# Patient Record
Sex: Female | Born: 1950 | Race: White | Hispanic: No | Marital: Married
Health system: Midwestern US, Academic
[De-identification: ages and names within clinical notes are randomized; demographics above are authoritative.]

---

## 2017-07-04 ENCOUNTER — Encounter: Admit: 2017-07-04 | Discharge: 2017-07-05 | Payer: MEDICARE

## 2017-07-05 ENCOUNTER — Encounter: Admit: 2017-07-05 | Discharge: 2017-07-06 | Payer: Commercial Managed Care - PPO

## 2017-07-14 ENCOUNTER — Encounter: Admit: 2017-07-14 | Discharge: 2017-07-14 | Payer: MEDICARE

## 2017-07-15 ENCOUNTER — Encounter: Admit: 2017-07-15 | Discharge: 2017-07-15 | Payer: MEDICARE

## 2017-07-15 ENCOUNTER — Encounter: Admit: 2017-07-15 | Discharge: 2017-07-15 | Payer: Commercial Managed Care - PPO

## 2017-07-15 DIAGNOSIS — N6310 Unspecified lump in the right breast, unspecified quadrant: ICD-10-CM

## 2017-07-15 DIAGNOSIS — R928 Other abnormal and inconclusive findings on diagnostic imaging of breast: Principal | ICD-10-CM

## 2017-07-17 ENCOUNTER — Encounter: Admit: 2017-07-17 | Discharge: 2017-07-17 | Payer: MEDICARE

## 2017-07-18 ENCOUNTER — Ambulatory Visit: Admit: 2017-07-18 | Discharge: 2017-07-19 | Payer: Commercial Managed Care - PPO

## 2017-07-18 ENCOUNTER — Ambulatory Visit: Admit: 2017-07-18 | Discharge: 2017-07-19 | Payer: MEDICARE

## 2017-07-18 ENCOUNTER — Encounter: Admit: 2017-07-18 | Discharge: 2017-07-18 | Payer: MEDICARE

## 2017-07-18 ENCOUNTER — Ambulatory Visit: Admit: 2017-07-18 | Discharge: 2017-07-18

## 2017-07-18 ENCOUNTER — Encounter: Admit: 2017-07-18 | Discharge: 2017-07-18 | Payer: Commercial Managed Care - PPO

## 2017-07-18 DIAGNOSIS — N6310 Unspecified lump in the right breast, unspecified quadrant: ICD-10-CM

## 2017-07-18 DIAGNOSIS — M549 Dorsalgia, unspecified: ICD-10-CM

## 2017-07-18 DIAGNOSIS — M109 Gout, unspecified: ICD-10-CM

## 2017-07-18 DIAGNOSIS — M1990 Unspecified osteoarthritis, unspecified site: Principal | ICD-10-CM

## 2017-07-18 DIAGNOSIS — R928 Other abnormal and inconclusive findings on diagnostic imaging of breast: Principal | ICD-10-CM

## 2017-07-18 DIAGNOSIS — M797 Fibromyalgia: ICD-10-CM

## 2017-07-18 DIAGNOSIS — H9190 Unspecified hearing loss, unspecified ear: ICD-10-CM

## 2017-07-18 MED ORDER — LIDOCAINE 1% (BUFFERED) SYRINGE (BREAST CENTER)
1 mL | Freq: Once | INTRAMUSCULAR | 0 refills | Status: CP
Start: 2017-07-18 — End: ?

## 2017-07-18 MED ORDER — LIDOCAINE 1%-EPINEPHRINE 1:100000 (BUFFERED) VIAL (BREAST CENTER)
2 mL | Freq: Once | INTRAMUSCULAR | 0 refills | Status: CP
Start: 2017-07-18 — End: ?

## 2017-07-23 ENCOUNTER — Encounter: Admit: 2017-07-23 | Discharge: 2017-07-23 | Payer: Commercial Managed Care - PPO

## 2017-07-23 DIAGNOSIS — C50411 Malignant neoplasm of upper-outer quadrant of right female breast: Principal | ICD-10-CM

## 2017-07-24 ENCOUNTER — Encounter: Admit: 2017-07-24 | Discharge: 2017-07-24 | Payer: MEDICARE

## 2017-07-24 DIAGNOSIS — H9190 Unspecified hearing loss, unspecified ear: ICD-10-CM

## 2017-07-24 DIAGNOSIS — M549 Dorsalgia, unspecified: ICD-10-CM

## 2017-07-24 DIAGNOSIS — M797 Fibromyalgia: ICD-10-CM

## 2017-07-24 DIAGNOSIS — M109 Gout, unspecified: ICD-10-CM

## 2017-07-24 DIAGNOSIS — M1990 Unspecified osteoarthritis, unspecified site: Principal | ICD-10-CM

## 2017-07-25 ENCOUNTER — Encounter: Admit: 2017-07-25 | Discharge: 2017-07-25 | Payer: MEDICARE

## 2017-07-25 ENCOUNTER — Encounter: Admit: 2017-07-25 | Discharge: 2017-07-25 | Payer: Commercial Managed Care - PPO

## 2017-07-25 DIAGNOSIS — M109 Gout, unspecified: ICD-10-CM

## 2017-07-25 DIAGNOSIS — M1990 Unspecified osteoarthritis, unspecified site: Principal | ICD-10-CM

## 2017-07-25 DIAGNOSIS — H9190 Unspecified hearing loss, unspecified ear: ICD-10-CM

## 2017-07-25 DIAGNOSIS — M797 Fibromyalgia: ICD-10-CM

## 2017-07-25 DIAGNOSIS — Z170 Estrogen receptor positive status [ER+]: ICD-10-CM

## 2017-07-25 DIAGNOSIS — M549 Dorsalgia, unspecified: ICD-10-CM

## 2017-07-25 DIAGNOSIS — C50411 Malignant neoplasm of upper-outer quadrant of right female breast: Principal | ICD-10-CM

## 2017-07-30 ENCOUNTER — Ambulatory Visit: Admit: 2017-07-30 | Discharge: 2017-07-30 | Payer: Commercial Managed Care - PPO

## 2017-07-30 DIAGNOSIS — Z170 Estrogen receptor positive status [ER+]: ICD-10-CM

## 2017-07-30 DIAGNOSIS — C50411 Malignant neoplasm of upper-outer quadrant of right female breast: Principal | ICD-10-CM

## 2017-07-30 MED ORDER — SODIUM CHLORIDE 0.9 % IJ SOLN
50 mL | Freq: Once | INTRAVENOUS | 0 refills | Status: CP
Start: 2017-07-30 — End: ?
  Administered 2017-07-30: 19:00:00 50 mL via INTRAVENOUS

## 2017-07-30 MED ORDER — GADOBENATE DIMEGLUMINE 529 MG/ML (0.1MMOL/0.2ML) IV SOLN
15 mL | Freq: Once | INTRAVENOUS | 0 refills | Status: CP
Start: 2017-07-30 — End: ?
  Administered 2017-07-30: 19:00:00 15 mL via INTRAVENOUS

## 2017-07-31 ENCOUNTER — Encounter: Admit: 2017-07-31 | Discharge: 2017-07-31 | Payer: MEDICARE

## 2017-07-31 ENCOUNTER — Encounter: Admit: 2017-07-31 | Discharge: 2017-07-31 | Payer: Commercial Managed Care - PPO

## 2017-07-31 DIAGNOSIS — C50411 Malignant neoplasm of upper-outer quadrant of right female breast: ICD-10-CM

## 2017-07-31 DIAGNOSIS — I890 Lymphedema, not elsewhere classified: ICD-10-CM

## 2017-07-31 DIAGNOSIS — H9190 Unspecified hearing loss, unspecified ear: ICD-10-CM

## 2017-07-31 DIAGNOSIS — Z9189 Other specified personal risk factors, not elsewhere classified: ICD-10-CM

## 2017-07-31 DIAGNOSIS — Z01818 Encounter for other preprocedural examination: Principal | ICD-10-CM

## 2017-07-31 DIAGNOSIS — M549 Dorsalgia, unspecified: ICD-10-CM

## 2017-07-31 DIAGNOSIS — Z170 Estrogen receptor positive status [ER+]: ICD-10-CM

## 2017-07-31 DIAGNOSIS — M1990 Unspecified osteoarthritis, unspecified site: Principal | ICD-10-CM

## 2017-07-31 DIAGNOSIS — M797 Fibromyalgia: ICD-10-CM

## 2017-07-31 DIAGNOSIS — M109 Gout, unspecified: ICD-10-CM

## 2017-07-31 LAB — COMPREHENSIVE METABOLIC PANEL
Lab: 0.7 mg/dL (ref 0.4–1.00)
Lab: 105 MMOL/L (ref 98–110)
Lab: 141 MMOL/L (ref 137–147)
Lab: 20 mg/dL (ref 7–25)
Lab: 3.7 MMOL/L (ref 3.5–5.1)
Lab: 9.5 mg/dL — ABNORMAL LOW (ref 8.5–10.6)
Lab: 91 mg/dL (ref 70–100)

## 2017-07-31 LAB — CBC AND DIFF
Lab: 4.3 M/UL (ref 4.0–5.0)
Lab: 7.5 10*3/uL (ref 4.5–11.0)

## 2017-08-02 ENCOUNTER — Encounter: Admit: 2017-08-02 | Discharge: 2017-08-02 | Payer: MEDICARE

## 2017-08-02 DIAGNOSIS — C50411 Malignant neoplasm of upper-outer quadrant of right female breast: Principal | ICD-10-CM

## 2017-08-02 MED ORDER — CEFAZOLIN INJ 1GM IVP
2 g | Freq: Once | INTRAVENOUS | 0 refills | Status: CN
Start: 2017-08-02 — End: ?

## 2017-08-06 ENCOUNTER — Encounter: Admit: 2017-08-06 | Discharge: 2017-08-06 | Payer: Commercial Managed Care - PPO

## 2017-08-06 DIAGNOSIS — C50411 Malignant neoplasm of upper-outer quadrant of right female breast: Principal | ICD-10-CM

## 2017-08-08 ENCOUNTER — Encounter: Admit: 2017-08-08 | Discharge: 2017-08-08 | Payer: Commercial Managed Care - PPO

## 2017-08-08 DIAGNOSIS — H9190 Unspecified hearing loss, unspecified ear: ICD-10-CM

## 2017-08-08 DIAGNOSIS — M1990 Unspecified osteoarthritis, unspecified site: Principal | ICD-10-CM

## 2017-08-08 DIAGNOSIS — J45909 Unspecified asthma, uncomplicated: ICD-10-CM

## 2017-08-08 DIAGNOSIS — G4730 Sleep apnea, unspecified: ICD-10-CM

## 2017-08-08 DIAGNOSIS — M109 Gout, unspecified: ICD-10-CM

## 2017-08-08 DIAGNOSIS — M549 Dorsalgia, unspecified: ICD-10-CM

## 2017-08-08 DIAGNOSIS — K929 Disease of digestive system, unspecified: ICD-10-CM

## 2017-08-08 DIAGNOSIS — M797 Fibromyalgia: ICD-10-CM

## 2017-08-08 DIAGNOSIS — Z87898 Personal history of other specified conditions: ICD-10-CM

## 2017-08-09 ENCOUNTER — Encounter: Admit: 2017-08-09 | Discharge: 2017-08-09 | Payer: MEDICARE

## 2017-08-09 ENCOUNTER — Encounter: Admit: 2017-08-09 | Discharge: 2017-08-09 | Payer: Commercial Managed Care - PPO

## 2017-08-09 ENCOUNTER — Ambulatory Visit: Admit: 2017-08-09 | Discharge: 2017-08-10 | Payer: Commercial Managed Care - PPO

## 2017-08-09 DIAGNOSIS — J45909 Unspecified asthma, uncomplicated: ICD-10-CM

## 2017-08-09 DIAGNOSIS — K929 Disease of digestive system, unspecified: ICD-10-CM

## 2017-08-09 DIAGNOSIS — M109 Gout, unspecified: ICD-10-CM

## 2017-08-09 DIAGNOSIS — G4730 Sleep apnea, unspecified: ICD-10-CM

## 2017-08-09 DIAGNOSIS — M1990 Unspecified osteoarthritis, unspecified site: Principal | ICD-10-CM

## 2017-08-09 DIAGNOSIS — Z87898 Personal history of other specified conditions: ICD-10-CM

## 2017-08-09 DIAGNOSIS — M549 Dorsalgia, unspecified: ICD-10-CM

## 2017-08-09 DIAGNOSIS — H9190 Unspecified hearing loss, unspecified ear: ICD-10-CM

## 2017-08-09 DIAGNOSIS — M797 Fibromyalgia: ICD-10-CM

## 2017-08-10 DIAGNOSIS — Z170 Estrogen receptor positive status [ER+]: ICD-10-CM

## 2017-08-10 DIAGNOSIS — C50411 Malignant neoplasm of upper-outer quadrant of right female breast: Principal | ICD-10-CM

## 2017-08-12 ENCOUNTER — Encounter: Admit: 2017-08-12 | Discharge: 2017-08-12 | Payer: Commercial Managed Care - PPO

## 2017-08-14 ENCOUNTER — Encounter: Admit: 2017-08-14 | Discharge: 2017-08-14 | Payer: MEDICARE

## 2017-08-15 ENCOUNTER — Encounter: Admit: 2017-08-15 | Discharge: 2017-08-16 | Payer: Commercial Managed Care - PPO

## 2017-08-15 ENCOUNTER — Encounter: Admit: 2017-08-15 | Discharge: 2017-08-15 | Payer: Commercial Managed Care - PPO

## 2017-08-15 ENCOUNTER — Encounter: Admit: 2017-08-15 | Discharge: 2017-08-15 | Payer: MEDICARE

## 2017-08-15 ENCOUNTER — Ambulatory Visit: Admit: 2017-08-15 | Discharge: 2017-08-16 | Payer: MEDICARE

## 2017-08-15 DIAGNOSIS — C50411 Malignant neoplasm of upper-outer quadrant of right female breast: Principal | ICD-10-CM

## 2017-08-15 DIAGNOSIS — M1990 Unspecified osteoarthritis, unspecified site: Principal | ICD-10-CM

## 2017-08-15 DIAGNOSIS — H9190 Unspecified hearing loss, unspecified ear: ICD-10-CM

## 2017-08-15 DIAGNOSIS — J45909 Unspecified asthma, uncomplicated: ICD-10-CM

## 2017-08-15 DIAGNOSIS — M109 Gout, unspecified: ICD-10-CM

## 2017-08-15 DIAGNOSIS — G4730 Sleep apnea, unspecified: ICD-10-CM

## 2017-08-15 DIAGNOSIS — M797 Fibromyalgia: ICD-10-CM

## 2017-08-15 DIAGNOSIS — M549 Dorsalgia, unspecified: ICD-10-CM

## 2017-08-15 DIAGNOSIS — Z87898 Personal history of other specified conditions: ICD-10-CM

## 2017-08-15 DIAGNOSIS — K929 Disease of digestive system, unspecified: ICD-10-CM

## 2017-08-15 MED ORDER — CEFAZOLIN INJ 1GM IVP
2 g | Freq: Once | INTRAVENOUS | 0 refills | Status: CP
Start: 2017-08-15 — End: ?
  Administered 2017-08-15: 13:00:00 2 g via INTRAVENOUS

## 2017-08-15 MED ORDER — LIDOCAINE (PF) 10 MG/ML (1 %) IJ SOLN
.1-2 mL | Freq: Once | INTRAMUSCULAR | 0 refills | Status: CP
Start: 2017-08-15 — End: ?

## 2017-08-15 MED ORDER — ALLOPURINOL 100 MG PO TAB
100 mg | Freq: Every day | ORAL | 0 refills | Status: DC
Start: 2017-08-15 — End: 2017-08-16
  Administered 2017-08-16: 13:00:00 100 mg via ORAL

## 2017-08-15 MED ORDER — GABAPENTIN 300 MG PO CAP
300 mg | Freq: Once | ORAL | 0 refills | Status: CP
Start: 2017-08-15 — End: ?
  Administered 2017-08-15: 13:00:00 300 mg via ORAL

## 2017-08-15 MED ORDER — HYDROMORPHONE (PF) 2 MG/ML IJ SYRG
.5-1 mg | INTRAVENOUS | 0 refills | Status: DC | PRN
Start: 2017-08-15 — End: 2017-08-15

## 2017-08-15 MED ORDER — PROMETHAZINE 25 MG/ML IJ SOLN
12.5 mg | INTRAVENOUS | 0 refills | Status: DC | PRN
Start: 2017-08-15 — End: 2017-08-16

## 2017-08-15 MED ORDER — GABAPENTIN 300 MG PO CAP
300 mg | Freq: Every day | ORAL | 0 refills | Status: DC
Start: 2017-08-15 — End: 2017-08-16
  Administered 2017-08-16: 13:00:00 300 mg via ORAL

## 2017-08-15 MED ORDER — ONDANSETRON HCL (PF) 4 MG/2 ML IJ SOLN
4-8 mg | INTRAVENOUS | 0 refills | Status: DC | PRN
Start: 2017-08-15 — End: 2017-08-16

## 2017-08-15 MED ORDER — CEFAZOLIN INJ 1GM IVP
1 g | INTRAVENOUS | 0 refills | Status: CP
Start: 2017-08-15 — End: ?
  Administered 2017-08-15 – 2017-08-16 (×2): 1 g via INTRAVENOUS

## 2017-08-15 MED ORDER — HALOPERIDOL LACTATE 5 MG/ML IJ SOLN
1 mg | Freq: Once | INTRAVENOUS | 0 refills | Status: DC | PRN
Start: 2017-08-15 — End: 2017-08-15

## 2017-08-15 MED ORDER — FENTANYL CITRATE (PF) 50 MCG/ML IJ SOLN
0 refills | Status: DC
Start: 2017-08-15 — End: 2017-08-15
  Administered 2017-08-15 (×2): 50 ug via INTRAVENOUS

## 2017-08-15 MED ORDER — MIDAZOLAM 1 MG/ML IJ SOLN
INTRAVENOUS | 0 refills | Status: DC
Start: 2017-08-15 — End: 2017-08-15
  Administered 2017-08-15: 13:00:00 2 mg via INTRAVENOUS

## 2017-08-15 MED ORDER — DIAZEPAM 5 MG PO TAB
5 mg | ORAL | 0 refills | Status: DC | PRN
Start: 2017-08-15 — End: 2017-08-16

## 2017-08-15 MED ORDER — FENTANYL CITRATE (PF) 50 MCG/ML IJ SOLN
25 ug | INTRAVENOUS | 0 refills | Status: DC | PRN
Start: 2017-08-15 — End: 2017-08-15

## 2017-08-15 MED ORDER — SIMVASTATIN 20 MG PO TAB
20 mg | Freq: Every evening | ORAL | 0 refills | Status: DC
Start: 2017-08-15 — End: 2017-08-16
  Administered 2017-08-16: 01:00:00 20 mg via ORAL

## 2017-08-15 MED ORDER — RP DX TC-99M SULF COLL MCI
.5 | Freq: Once | INTRAVENOUS | 0 refills | Status: CP
Start: 2017-08-15 — End: ?
  Administered 2017-08-15: 13:00:00 0.5 via INTRAVENOUS

## 2017-08-15 MED ORDER — OXYCODONE 5 MG PO TAB
5-10 mg | Freq: Once | ORAL | 0 refills | Status: DC | PRN
Start: 2017-08-15 — End: 2017-08-15

## 2017-08-15 MED ORDER — DEXAMETHASONE SODIUM PHOSPHATE 4 MG/ML IJ SOLN
INTRAVENOUS | 0 refills | Status: DC
Start: 2017-08-15 — End: 2017-08-15
  Administered 2017-08-15: 13:00:00 10 mg via INTRAVENOUS

## 2017-08-15 MED ORDER — ISOSULFAN BLUE 1 % SC SOLN
0 refills | Status: DC
Start: 2017-08-15 — End: 2017-08-15
  Administered 2017-08-15: 13:00:00 2.5 mL via INTRAMUSCULAR

## 2017-08-15 MED ORDER — OXYCODONE 5 MG PO TAB
5-10 mg | ORAL | 0 refills | Status: DC | PRN
Start: 2017-08-15 — End: 2017-08-16

## 2017-08-15 MED ORDER — METOCLOPRAMIDE HCL 5 MG/ML IJ SOLN
10 mg | Freq: Once | INTRAVENOUS | 0 refills | Status: DC | PRN
Start: 2017-08-15 — End: 2017-08-15

## 2017-08-15 MED ORDER — DEXTROSE 5%-0.45% SODIUM CHLORIDE & POTASSIUM CHLORIDE 20 MEQ/L IV SOLP
INTRAVENOUS | 0 refills | Status: DC
Start: 2017-08-15 — End: 2017-08-16
  Administered 2017-08-15: 21:00:00 1000.000 mL via INTRAVENOUS

## 2017-08-15 MED ORDER — MORPHINE 4 MG/ML IV CRTG
2-4 mg | INTRAVENOUS | 0 refills | Status: DC | PRN
Start: 2017-08-15 — End: 2017-08-16

## 2017-08-15 MED ORDER — ACETAMINOPHEN 500 MG PO TAB
1000 mg | Freq: Once | ORAL | 0 refills | Status: CP
Start: 2017-08-15 — End: ?
  Administered 2017-08-15: 13:00:00 1000 mg via ORAL

## 2017-08-15 MED ORDER — PROPOFOL INJ 10 MG/ML IV VIAL
0 refills | Status: DC
Start: 2017-08-15 — End: 2017-08-15
  Administered 2017-08-15: 13:00:00 200 mg via INTRAVENOUS

## 2017-08-15 MED ORDER — PANTOPRAZOLE 40 MG PO TBEC
40 mg | Freq: Every day | ORAL | 0 refills | Status: DC
Start: 2017-08-15 — End: 2017-08-16
  Administered 2017-08-16: 13:00:00 40 mg via ORAL

## 2017-08-15 MED ORDER — LIDOCAINE (PF) 200 MG/10 ML (2 %) IJ SYRG
0 refills | Status: DC
Start: 2017-08-15 — End: 2017-08-15
  Administered 2017-08-15: 13:00:00 50 mg via INTRAVENOUS

## 2017-08-15 MED ORDER — LACTATED RINGERS IV SOLP
INTRAVENOUS | 0 refills | Status: DC
Start: 2017-08-15 — End: 2017-08-16
  Administered 2017-08-15: 12:00:00 1000 mL via INTRAVENOUS

## 2017-08-15 MED ORDER — DIPHENHYDRAMINE HCL 50 MG/ML IJ SOLN
25 mg | Freq: Once | INTRAVENOUS | 0 refills | Status: DC | PRN
Start: 2017-08-15 — End: 2017-08-15

## 2017-08-15 MED ORDER — ACETAMINOPHEN 500 MG PO TAB
1000 mg | ORAL | 0 refills | Status: DC
Start: 2017-08-15 — End: 2017-08-16
  Administered 2017-08-15 – 2017-08-16 (×3): 1000 mg via ORAL

## 2017-08-15 MED ORDER — ISOSULFAN BLUE 1 % SC SOLN
25 mg | Freq: Once | INTRAMUSCULAR | 0 refills | Status: AC
Start: 2017-08-15 — End: ?

## 2017-08-15 MED ORDER — BACITRACIN 50,000 UN NS 1000 ML IRR BOT (OR)
0 refills | Status: DC
Start: 2017-08-15 — End: 2017-08-15
  Administered 2017-08-15 (×2): 1000 mL

## 2017-08-15 MED ORDER — SENNOSIDES-DOCUSATE SODIUM 8.6-50 MG PO TAB
1 | Freq: Two times a day (BID) | ORAL | 0 refills | Status: DC
Start: 2017-08-15 — End: 2017-08-16
  Administered 2017-08-16 (×2): 1 via ORAL

## 2017-08-15 MED ORDER — SUCCINYLCHOLINE CHLORIDE 20 MG/ML IJ SOLN
INTRAVENOUS | 0 refills | Status: DC
Start: 2017-08-15 — End: 2017-08-15
  Administered 2017-08-15: 13:00:00 100 mg via INTRAVENOUS

## 2017-08-15 MED ORDER — PNEUMOCOCCAL 23-VAL PS VACCINE 25 MCG/0.5 ML IJ SOLN
0.5 mL | Freq: Once | INTRAMUSCULAR | 0 refills | Status: CP
Start: 2017-08-15 — End: ?
  Administered 2017-08-16: 13:00:00 0.5 mL via INTRAMUSCULAR

## 2017-08-15 MED ORDER — FAMOTIDINE (PF) 20 MG/2 ML IV SOLN
0 refills | Status: DC
Start: 2017-08-15 — End: 2017-08-15
  Administered 2017-08-15: 13:00:00 20 mg via INTRAVENOUS

## 2017-08-15 MED ORDER — OXYCODONE SR 10 MG PO 12HR TABLET
10 mg | Freq: Once | ORAL | 0 refills | Status: CP
Start: 2017-08-15 — End: ?
  Administered 2017-08-15: 13:00:00 10 mg via ORAL

## 2017-08-15 MED ORDER — ONDANSETRON HCL (PF) 4 MG/2 ML IJ SOLN
INTRAVENOUS | 0 refills | Status: DC
Start: 2017-08-15 — End: 2017-08-15
  Administered 2017-08-15: 15:00:00 4 mg via INTRAVENOUS

## 2017-08-15 MED ORDER — DIPHENHYDRAMINE HCL 50 MG/ML IJ SOLN
0 refills | Status: DC
Start: 2017-08-15 — End: 2017-08-15
  Administered 2017-08-15: 13:00:00 25 mg via INTRAVENOUS

## 2017-08-15 MED ADMIN — SODIUM CHLORIDE 0.9 % IJ SOLN [7319]: 20 mL | INTRAVENOUS | @ 21:00:00 | Stop: 2017-08-15 | NDC 00409488803

## 2017-08-15 MED ADMIN — LIDOCAINE (PF) 10 MG/ML (1 %) IJ SOLN [95838]: 0.1 mL | INTRAMUSCULAR | @ 12:00:00 | Stop: 2017-08-15 | NDC 63323049227

## 2017-08-16 ENCOUNTER — Encounter: Admit: 2017-08-16 | Discharge: 2017-08-16 | Payer: MEDICARE

## 2017-08-16 DIAGNOSIS — Z170 Estrogen receptor positive status [ER+]: ICD-10-CM

## 2017-08-16 DIAGNOSIS — Z23 Encounter for immunization: ICD-10-CM

## 2017-08-16 DIAGNOSIS — M797 Fibromyalgia: ICD-10-CM

## 2017-08-16 DIAGNOSIS — C50411 Malignant neoplasm of upper-outer quadrant of right female breast: Principal | ICD-10-CM

## 2017-08-16 DIAGNOSIS — Z9884 Bariatric surgery status: ICD-10-CM

## 2017-08-16 DIAGNOSIS — L821 Other seborrheic keratosis: ICD-10-CM

## 2017-08-16 MED ORDER — ACETAMINOPHEN 325 MG PO TAB
650 mg | ORAL_TABLET | ORAL | 0 refills | Status: AC
Start: 2017-08-16 — End: 2019-04-23

## 2017-08-16 MED ORDER — DOXYCYCLINE HYCLATE 100 MG PO CAP
100 mg | ORAL_CAPSULE | Freq: Two times a day (BID) | ORAL | 0 refills | 8.00000 days | Status: AC
Start: 2017-08-16 — End: 2017-09-20
  Filled 2017-08-16 (×2): qty 42, 21d supply, fill #1

## 2017-08-16 MED ORDER — SENNOSIDES-DOCUSATE SODIUM 8.6-50 MG PO TAB
1 | ORAL_TABLET | Freq: Every day | ORAL | 1 refills | Status: AC
Start: 2017-08-16 — End: ?

## 2017-08-16 MED ORDER — OXYCODONE 5 MG PO TAB
5-10 mg | ORAL_TABLET | ORAL | 0 refills | 6.00000 days | Status: AC | PRN
Start: 2017-08-16 — End: 2017-09-26
  Filled 2017-08-16 (×2): qty 16, 2d supply, fill #1

## 2017-08-16 MED ORDER — DIAZEPAM 5 MG PO TAB
5 mg | ORAL_TABLET | ORAL | 0 refills | Status: SS | PRN
Start: 2017-08-16 — End: 2017-10-10

## 2017-08-16 MED ADMIN — SODIUM CHLORIDE 0.9 % IJ SOLN [7319]: 10 mL | INTRAVENOUS | @ 05:00:00 | Stop: 2017-08-16 | NDC 00409488803

## 2017-08-16 MED FILL — DIAZEPAM 5 MG PO TAB: 5 mg | ORAL | 8 days supply | Qty: 32 | Fill #1 | Status: CP

## 2017-08-19 ENCOUNTER — Encounter: Admit: 2017-08-19 | Discharge: 2017-08-19 | Payer: MEDICARE

## 2017-08-19 DIAGNOSIS — M109 Gout, unspecified: ICD-10-CM

## 2017-08-19 DIAGNOSIS — M549 Dorsalgia, unspecified: ICD-10-CM

## 2017-08-19 DIAGNOSIS — Z87898 Personal history of other specified conditions: ICD-10-CM

## 2017-08-19 DIAGNOSIS — G4730 Sleep apnea, unspecified: ICD-10-CM

## 2017-08-19 DIAGNOSIS — J45909 Unspecified asthma, uncomplicated: ICD-10-CM

## 2017-08-19 DIAGNOSIS — H9190 Unspecified hearing loss, unspecified ear: ICD-10-CM

## 2017-08-19 DIAGNOSIS — M797 Fibromyalgia: ICD-10-CM

## 2017-08-19 DIAGNOSIS — K929 Disease of digestive system, unspecified: ICD-10-CM

## 2017-08-19 DIAGNOSIS — M1990 Unspecified osteoarthritis, unspecified site: Principal | ICD-10-CM

## 2017-08-23 ENCOUNTER — Ambulatory Visit: Admit: 2017-08-23 | Discharge: 2017-08-24 | Payer: Commercial Managed Care - PPO

## 2017-08-23 ENCOUNTER — Encounter: Admit: 2017-08-23 | Discharge: 2017-08-23 | Payer: Commercial Managed Care - PPO

## 2017-08-23 ENCOUNTER — Encounter: Admit: 2017-08-23 | Discharge: 2017-08-23 | Payer: MEDICARE

## 2017-08-23 DIAGNOSIS — J45909 Unspecified asthma, uncomplicated: ICD-10-CM

## 2017-08-23 DIAGNOSIS — M109 Gout, unspecified: ICD-10-CM

## 2017-08-23 DIAGNOSIS — H9190 Unspecified hearing loss, unspecified ear: ICD-10-CM

## 2017-08-23 DIAGNOSIS — M549 Dorsalgia, unspecified: ICD-10-CM

## 2017-08-23 DIAGNOSIS — G4730 Sleep apnea, unspecified: ICD-10-CM

## 2017-08-23 DIAGNOSIS — M1990 Unspecified osteoarthritis, unspecified site: Principal | ICD-10-CM

## 2017-08-23 DIAGNOSIS — M797 Fibromyalgia: ICD-10-CM

## 2017-08-23 DIAGNOSIS — Z87898 Personal history of other specified conditions: ICD-10-CM

## 2017-08-23 DIAGNOSIS — K929 Disease of digestive system, unspecified: ICD-10-CM

## 2017-08-24 DIAGNOSIS — Z170 Estrogen receptor positive status [ER+]: ICD-10-CM

## 2017-08-24 DIAGNOSIS — C50411 Malignant neoplasm of upper-outer quadrant of right female breast: Principal | ICD-10-CM

## 2017-08-26 ENCOUNTER — Encounter: Admit: 2017-08-26 | Discharge: 2017-08-26 | Payer: MEDICARE

## 2017-08-27 ENCOUNTER — Encounter: Admit: 2017-08-27 | Discharge: 2017-08-27 | Payer: Commercial Managed Care - PPO

## 2017-08-27 ENCOUNTER — Encounter: Admit: 2017-08-27 | Discharge: 2017-08-27 | Payer: MEDICARE

## 2017-08-27 DIAGNOSIS — K929 Disease of digestive system, unspecified: ICD-10-CM

## 2017-08-27 DIAGNOSIS — Z170 Estrogen receptor positive status [ER+]: ICD-10-CM

## 2017-08-27 DIAGNOSIS — H9190 Unspecified hearing loss, unspecified ear: ICD-10-CM

## 2017-08-27 DIAGNOSIS — M109 Gout, unspecified: ICD-10-CM

## 2017-08-27 DIAGNOSIS — Z87898 Personal history of other specified conditions: ICD-10-CM

## 2017-08-27 DIAGNOSIS — G4730 Sleep apnea, unspecified: ICD-10-CM

## 2017-08-27 DIAGNOSIS — M797 Fibromyalgia: ICD-10-CM

## 2017-08-27 DIAGNOSIS — M549 Dorsalgia, unspecified: ICD-10-CM

## 2017-08-27 DIAGNOSIS — C50411 Malignant neoplasm of upper-outer quadrant of right female breast: Principal | ICD-10-CM

## 2017-08-27 DIAGNOSIS — M1990 Unspecified osteoarthritis, unspecified site: Principal | ICD-10-CM

## 2017-08-27 DIAGNOSIS — J45909 Unspecified asthma, uncomplicated: ICD-10-CM

## 2017-08-30 ENCOUNTER — Encounter: Admit: 2017-08-30 | Discharge: 2017-08-30 | Payer: MEDICARE

## 2017-08-30 ENCOUNTER — Ambulatory Visit: Admit: 2017-08-30 | Discharge: 2017-08-31 | Payer: Commercial Managed Care - PPO

## 2017-08-30 DIAGNOSIS — C50411 Malignant neoplasm of upper-outer quadrant of right female breast: Principal | ICD-10-CM

## 2017-08-30 DIAGNOSIS — G4730 Sleep apnea, unspecified: ICD-10-CM

## 2017-08-30 DIAGNOSIS — M1990 Unspecified osteoarthritis, unspecified site: Principal | ICD-10-CM

## 2017-08-30 DIAGNOSIS — M549 Dorsalgia, unspecified: ICD-10-CM

## 2017-08-30 DIAGNOSIS — M109 Gout, unspecified: ICD-10-CM

## 2017-08-30 DIAGNOSIS — H9190 Unspecified hearing loss, unspecified ear: ICD-10-CM

## 2017-08-30 DIAGNOSIS — Z87898 Personal history of other specified conditions: ICD-10-CM

## 2017-08-30 DIAGNOSIS — J45909 Unspecified asthma, uncomplicated: ICD-10-CM

## 2017-08-30 DIAGNOSIS — K929 Disease of digestive system, unspecified: ICD-10-CM

## 2017-08-30 DIAGNOSIS — M797 Fibromyalgia: ICD-10-CM

## 2017-09-02 ENCOUNTER — Encounter: Admit: 2017-09-02 | Discharge: 2017-09-02 | Payer: Commercial Managed Care - PPO

## 2017-09-04 ENCOUNTER — Encounter: Admit: 2017-09-04 | Discharge: 2017-09-04 | Payer: MEDICARE

## 2017-09-04 DIAGNOSIS — C50411 Malignant neoplasm of upper-outer quadrant of right female breast: Principal | ICD-10-CM

## 2017-09-05 ENCOUNTER — Encounter: Admit: 2017-09-05 | Discharge: 2017-09-05 | Payer: Commercial Managed Care - PPO

## 2017-09-05 ENCOUNTER — Ambulatory Visit: Admit: 2017-09-05 | Discharge: 2017-09-06 | Payer: Commercial Managed Care - PPO

## 2017-09-05 DIAGNOSIS — M109 Gout, unspecified: ICD-10-CM

## 2017-09-05 DIAGNOSIS — J45909 Unspecified asthma, uncomplicated: ICD-10-CM

## 2017-09-05 DIAGNOSIS — K929 Disease of digestive system, unspecified: ICD-10-CM

## 2017-09-05 DIAGNOSIS — M1990 Unspecified osteoarthritis, unspecified site: Principal | ICD-10-CM

## 2017-09-05 DIAGNOSIS — M797 Fibromyalgia: ICD-10-CM

## 2017-09-05 DIAGNOSIS — G4730 Sleep apnea, unspecified: ICD-10-CM

## 2017-09-05 DIAGNOSIS — Z87898 Personal history of other specified conditions: ICD-10-CM

## 2017-09-05 DIAGNOSIS — H9190 Unspecified hearing loss, unspecified ear: ICD-10-CM

## 2017-09-05 DIAGNOSIS — M549 Dorsalgia, unspecified: ICD-10-CM

## 2017-09-06 DIAGNOSIS — C50411 Malignant neoplasm of upper-outer quadrant of right female breast: Principal | ICD-10-CM

## 2017-09-06 DIAGNOSIS — Z170 Estrogen receptor positive status [ER+]: ICD-10-CM

## 2017-09-10 ENCOUNTER — Encounter: Admit: 2017-09-10 | Discharge: 2017-09-10 | Payer: Commercial Managed Care - PPO

## 2017-09-10 DIAGNOSIS — C50411 Malignant neoplasm of upper-outer quadrant of right female breast: Principal | ICD-10-CM

## 2017-09-12 ENCOUNTER — Encounter: Admit: 2017-09-12 | Discharge: 2017-09-12 | Payer: MEDICARE

## 2017-09-12 DIAGNOSIS — C50411 Malignant neoplasm of upper-outer quadrant of right female breast: Principal | ICD-10-CM

## 2017-09-13 ENCOUNTER — Ambulatory Visit: Admit: 2017-09-13 | Discharge: 2017-09-13 | Payer: Commercial Managed Care - PPO

## 2017-09-13 ENCOUNTER — Encounter: Admit: 2017-09-13 | Discharge: 2017-09-13 | Payer: MEDICARE

## 2017-09-13 ENCOUNTER — Encounter: Admit: 2017-09-13 | Discharge: 2017-09-13 | Payer: Commercial Managed Care - PPO

## 2017-09-13 ENCOUNTER — Ambulatory Visit: Admit: 2017-09-13 | Discharge: 2017-09-14 | Payer: MEDICARE

## 2017-09-13 DIAGNOSIS — H9190 Unspecified hearing loss, unspecified ear: ICD-10-CM

## 2017-09-13 DIAGNOSIS — G4730 Sleep apnea, unspecified: ICD-10-CM

## 2017-09-13 DIAGNOSIS — Z170 Estrogen receptor positive status [ER+]: Secondary | ICD-10-CM

## 2017-09-13 DIAGNOSIS — Z9011 Acquired absence of right breast and nipple: ICD-10-CM

## 2017-09-13 DIAGNOSIS — Z9889 Other specified postprocedural states: ICD-10-CM

## 2017-09-13 DIAGNOSIS — R112 Nausea with vomiting, unspecified: ICD-10-CM

## 2017-09-13 DIAGNOSIS — R42 Dizziness and giddiness: ICD-10-CM

## 2017-09-13 DIAGNOSIS — C50411 Malignant neoplasm of upper-outer quadrant of right female breast: Principal | ICD-10-CM

## 2017-09-13 DIAGNOSIS — M109 Gout, unspecified: ICD-10-CM

## 2017-09-13 DIAGNOSIS — M549 Dorsalgia, unspecified: ICD-10-CM

## 2017-09-13 DIAGNOSIS — K929 Disease of digestive system, unspecified: ICD-10-CM

## 2017-09-13 DIAGNOSIS — Z87898 Personal history of other specified conditions: ICD-10-CM

## 2017-09-13 DIAGNOSIS — D0511 Intraductal carcinoma in situ of right breast: ICD-10-CM

## 2017-09-13 DIAGNOSIS — J45909 Unspecified asthma, uncomplicated: ICD-10-CM

## 2017-09-13 DIAGNOSIS — M1990 Unspecified osteoarthritis, unspecified site: Principal | ICD-10-CM

## 2017-09-13 DIAGNOSIS — M797 Fibromyalgia: ICD-10-CM

## 2017-09-13 LAB — POC CREATININE, RAD: Lab: 0.8 mg/dL (ref 0.4–1.00)

## 2017-09-13 MED ORDER — SODIUM CHLORIDE 0.9 % IJ SOLN
50 mL | Freq: Once | INTRAVENOUS | 0 refills | Status: CP
Start: 2017-09-13 — End: ?
  Administered 2017-09-13: 16:00:00 50 mL via INTRAVENOUS

## 2017-09-13 MED ORDER — ONDANSETRON HCL 4 MG PO TAB
4 mg | ORAL_TABLET | ORAL | 3 refills | 8.00000 days | Status: AC | PRN
Start: 2017-09-13 — End: 2017-09-13

## 2017-09-13 MED ORDER — IOHEXOL 350 MG IODINE/ML IV SOLN
60 mL | Freq: Once | INTRAVENOUS | 0 refills | Status: CP
Start: 2017-09-13 — End: ?
  Administered 2017-09-13: 20:00:00 60 mL via INTRAVENOUS

## 2017-09-13 MED ORDER — CEFAZOLIN INJ 1GM IVP
2 g | Freq: Once | INTRAVENOUS | 0 refills | Status: CN
Start: 2017-09-13 — End: ?

## 2017-09-13 MED ORDER — ONDANSETRON HCL 4 MG PO TAB
4 mg | ORAL_TABLET | ORAL | 3 refills | 8.00000 days | Status: AC | PRN
Start: 2017-09-13 — End: 2018-03-19
  Filled 2017-09-13 (×2): qty 21, 7d supply, fill #1

## 2017-09-13 MED ORDER — SODIUM CHLORIDE 0.9 % IJ SOLN
50 mL | Freq: Once | INTRAVENOUS | 0 refills | Status: CP
Start: 2017-09-13 — End: ?
  Administered 2017-09-13: 20:00:00 50 mL via INTRAVENOUS

## 2017-09-13 MED ORDER — IOHEXOL 350 MG IODINE/ML IV SOLN
70 mL | Freq: Once | INTRAVENOUS | 0 refills | Status: CP
Start: 2017-09-13 — End: ?
  Administered 2017-09-13: 16:00:00 70 mL via INTRAVENOUS

## 2017-09-14 ENCOUNTER — Encounter: Admit: 2017-09-14 | Discharge: 2017-09-14 | Payer: Commercial Managed Care - PPO

## 2017-09-14 ENCOUNTER — Encounter: Admit: 2017-09-14 | Discharge: 2017-09-14 | Payer: MEDICARE

## 2017-09-15 ENCOUNTER — Encounter: Admit: 2017-09-15 | Discharge: 2017-09-15 | Payer: MEDICARE

## 2017-09-15 MED ORDER — CYCLOPHOSPHAMIDE IVPB
600 mg/m2 | Freq: Once | INTRAVENOUS | 0 refills | Status: CN
Start: 2017-09-15 — End: ?

## 2017-09-15 MED ORDER — APREPITANT 7.2 MG/ML IV EMUL
130 mg | Freq: Once | INTRAVENOUS | 0 refills | Status: CN
Start: 2017-09-15 — End: ?

## 2017-09-15 MED ORDER — PEGFILGRASTIM-CBQV 6 MG/0.6 ML SC SYRG
6 mg | Freq: Once | SUBCUTANEOUS | 0 refills | Status: CN
Start: 2017-09-15 — End: ?

## 2017-09-15 MED ORDER — PALONOSETRON 0.25 MG/5 ML IV SOLN
.25 mg | Freq: Once | INTRAVENOUS | 0 refills | Status: CN
Start: 2017-09-15 — End: ?

## 2017-09-15 MED ORDER — DOXORUBICIN 2 MG/ML IV SOLN
60 mg/m2 | Freq: Once | INTRAVENOUS | 0 refills | Status: CN
Start: 2017-09-15 — End: ?

## 2017-09-15 MED ORDER — DEXAMETHASONE 6 MG PO TAB
12 mg | Freq: Once | ORAL | 0 refills | Status: CN
Start: 2017-09-15 — End: ?

## 2017-09-19 ENCOUNTER — Ambulatory Visit: Admit: 2017-09-19 | Discharge: 2017-09-19 | Payer: MEDICARE

## 2017-09-19 DIAGNOSIS — Z170 Estrogen receptor positive status [ER+]: Secondary | ICD-10-CM

## 2017-09-20 ENCOUNTER — Ambulatory Visit: Admit: 2017-09-20 | Discharge: 2017-09-20 | Payer: MEDICARE

## 2017-09-20 ENCOUNTER — Ambulatory Visit: Admit: 2017-09-20 | Discharge: 2017-09-21 | Payer: Commercial Managed Care - PPO

## 2017-09-20 ENCOUNTER — Ambulatory Visit: Admit: 2017-09-20 | Discharge: 2017-09-20 | Payer: Commercial Managed Care - PPO

## 2017-09-20 ENCOUNTER — Encounter: Admit: 2017-09-20 | Discharge: 2017-09-20 | Payer: MEDICARE

## 2017-09-20 ENCOUNTER — Encounter: Admit: 2017-09-20 | Discharge: 2017-09-20 | Payer: Commercial Managed Care - PPO

## 2017-09-20 DIAGNOSIS — C50411 Malignant neoplasm of upper-outer quadrant of right female breast: Principal | ICD-10-CM

## 2017-09-20 DIAGNOSIS — Z170 Estrogen receptor positive status [ER+]: Secondary | ICD-10-CM

## 2017-09-20 DIAGNOSIS — Z9889 Other specified postprocedural states: ICD-10-CM

## 2017-09-20 DIAGNOSIS — H9190 Unspecified hearing loss, unspecified ear: ICD-10-CM

## 2017-09-20 DIAGNOSIS — Z87898 Personal history of other specified conditions: ICD-10-CM

## 2017-09-20 DIAGNOSIS — M109 Gout, unspecified: ICD-10-CM

## 2017-09-20 DIAGNOSIS — J45909 Unspecified asthma, uncomplicated: ICD-10-CM

## 2017-09-20 DIAGNOSIS — M549 Dorsalgia, unspecified: ICD-10-CM

## 2017-09-20 DIAGNOSIS — M797 Fibromyalgia: ICD-10-CM

## 2017-09-20 DIAGNOSIS — G4730 Sleep apnea, unspecified: ICD-10-CM

## 2017-09-20 DIAGNOSIS — K929 Disease of digestive system, unspecified: ICD-10-CM

## 2017-09-20 DIAGNOSIS — M1990 Unspecified osteoarthritis, unspecified site: Principal | ICD-10-CM

## 2017-09-20 MED ORDER — HALOPERIDOL LACTATE 5 MG/ML IJ SOLN
1 mg | Freq: Once | INTRAVENOUS | 0 refills | Status: DC | PRN
Start: 2017-09-20 — End: 2017-09-25

## 2017-09-20 MED ORDER — SUCCINYLCHOLINE CHLORIDE 20 MG/ML IJ SOLN
INTRAVENOUS | 0 refills | Status: DC
Start: 2017-09-20 — End: 2017-09-20

## 2017-09-20 MED ORDER — HEPARIN, PORCINE (PF) 100 UNIT/ML IV SYRG
0 refills | Status: DC
Start: 2017-09-20 — End: 2017-09-25

## 2017-09-20 MED ORDER — PROMETHAZINE 25 MG/ML IJ SOLN
6.25 mg | INTRAVENOUS | 0 refills | Status: DC | PRN
Start: 2017-09-20 — End: 2017-09-25

## 2017-09-20 MED ORDER — ONDANSETRON 4 MG PO TBDI
4 mg | Freq: Once | ORAL | 0 refills | Status: DC | PRN
Start: 2017-09-20 — End: 2017-09-25

## 2017-09-20 MED ORDER — FENTANYL CITRATE (PF) 50 MCG/ML IJ SOLN
50 ug | INTRAVENOUS | 0 refills | Status: DC | PRN
Start: 2017-09-20 — End: 2017-09-25

## 2017-09-20 MED ORDER — FENTANYL CITRATE (PF) 50 MCG/ML IJ SOLN
25 ug | INTRAVENOUS | 0 refills | Status: DC | PRN
Start: 2017-09-20 — End: 2017-09-25

## 2017-09-20 MED ORDER — ONDANSETRON HCL (PF) 4 MG/2 ML IJ SOLN
4 mg | Freq: Once | INTRAVENOUS | 0 refills | Status: DC | PRN
Start: 2017-09-20 — End: 2017-09-25

## 2017-09-20 MED ORDER — HYDROCODONE-ACETAMINOPHEN 5-325 MG PO TAB
1 | ORAL_TABLET | ORAL | 0 refills | 30.00000 days | Status: AC | PRN
Start: 2017-09-20 — End: 2017-09-26

## 2017-09-20 MED ORDER — LIDOCAINE-EPINEPHRINE 1 %-1:100,000 IJ SOLN
0 refills | Status: DC
Start: 2017-09-20 — End: 2017-09-25

## 2017-09-20 MED ORDER — MEPERIDINE (PF) 25 MG/ML IJ SYRG
12.5 mg | INTRAVENOUS | 0 refills | Status: DC | PRN
Start: 2017-09-20 — End: 2017-09-25

## 2017-09-20 MED ORDER — LIDOCAINE (PF) 10 MG/ML (1 %) IJ SOLN
.1-2 mL | INTRAMUSCULAR | 0 refills | Status: DC | PRN
Start: 2017-09-20 — End: 2017-09-25

## 2017-09-20 MED ORDER — LACTATED RINGERS IV SOLP
1000 mL | Freq: Once | INTRAVENOUS | 0 refills | Status: CP
Start: 2017-09-20 — End: ?

## 2017-09-20 MED ORDER — PHENYLEPHRINE IN 0.9% NACL(PF) 1 MG/10 ML (100 MCG/ML) IV SYRG
INTRAVENOUS | 0 refills | Status: DC
Start: 2017-09-20 — End: 2017-09-20

## 2017-09-20 MED ORDER — LIDOCAINE (PF) 200 MG/10 ML (2 %) IJ SYRG
0 refills | Status: DC
Start: 2017-09-20 — End: 2017-09-20

## 2017-09-20 MED ORDER — FENTANYL CITRATE (PF) 50 MCG/ML IJ SOLN
0 refills | Status: DC
Start: 2017-09-20 — End: 2017-09-20

## 2017-09-20 MED ORDER — MIDAZOLAM 1 MG/ML IJ SOLN
INTRAVENOUS | 0 refills | Status: DC
Start: 2017-09-20 — End: 2017-09-20

## 2017-09-20 MED ORDER — BUPIVACAINE-EPINEPHRINE 0.25 %-1:200,000 IJ SOLN
0 refills | Status: DC
Start: 2017-09-20 — End: 2017-09-25

## 2017-09-20 MED ORDER — SCOPOLAMINE BASE 1 MG OVER 3 DAYS TD PT3D
1 | TRANSDERMAL | 0 refills | Status: DC
Start: 2017-09-20 — End: 2017-09-25

## 2017-09-20 MED ORDER — OXYCODONE 5 MG PO TAB
5-10 mg | Freq: Once | ORAL | 0 refills | Status: DC | PRN
Start: 2017-09-20 — End: 2017-09-25

## 2017-09-20 MED ORDER — LACTATED RINGERS IV SOLP
0 refills | Status: DC
Start: 2017-09-20 — End: 2017-09-20

## 2017-09-20 MED ORDER — DEXAMETHASONE SODIUM PHOSPHATE 4 MG/ML IJ SOLN
INTRAVENOUS | 0 refills | Status: DC
Start: 2017-09-20 — End: 2017-09-20

## 2017-09-20 MED ORDER — HYDROCODONE-ACETAMINOPHEN 5-325 MG PO TAB
1 | Freq: Once | ORAL | 0 refills | Status: CP
Start: 2017-09-20 — End: ?

## 2017-09-20 MED ORDER — PROPOFOL INJ 10 MG/ML IV VIAL
0 refills | Status: DC
Start: 2017-09-20 — End: 2017-09-20

## 2017-09-20 MED ORDER — ONDANSETRON HCL (PF) 4 MG/2 ML IJ SOLN
INTRAVENOUS | 0 refills | Status: DC
Start: 2017-09-20 — End: 2017-09-20

## 2017-09-20 MED ORDER — CEFAZOLIN INJ 1GM IVP
2 g | Freq: Once | INTRAVENOUS | 0 refills | Status: CP
Start: 2017-09-20 — End: ?

## 2017-09-25 ENCOUNTER — Encounter: Admit: 2017-09-25 | Discharge: 2017-09-25 | Payer: MEDICARE

## 2017-09-25 ENCOUNTER — Encounter: Admit: 2017-09-25 | Discharge: 2017-09-25 | Payer: Commercial Managed Care - PPO

## 2017-09-25 DIAGNOSIS — M109 Gout, unspecified: ICD-10-CM

## 2017-09-25 DIAGNOSIS — G4730 Sleep apnea, unspecified: ICD-10-CM

## 2017-09-25 DIAGNOSIS — J45909 Unspecified asthma, uncomplicated: ICD-10-CM

## 2017-09-25 DIAGNOSIS — Z87898 Personal history of other specified conditions: ICD-10-CM

## 2017-09-25 DIAGNOSIS — M797 Fibromyalgia: ICD-10-CM

## 2017-09-25 DIAGNOSIS — M1990 Unspecified osteoarthritis, unspecified site: Principal | ICD-10-CM

## 2017-09-25 DIAGNOSIS — H9190 Unspecified hearing loss, unspecified ear: ICD-10-CM

## 2017-09-25 DIAGNOSIS — M549 Dorsalgia, unspecified: ICD-10-CM

## 2017-09-25 DIAGNOSIS — K929 Disease of digestive system, unspecified: ICD-10-CM

## 2017-09-25 MED ORDER — DEXAMETHASONE 4 MG PO TAB
ORAL_TABLET | INTRAMUSCULAR | 0 refills | 14.00000 days | Status: AC
Start: 2017-09-25 — End: 2017-10-24
  Filled 2017-10-01 (×2): qty 24, 30d supply, fill #1

## 2017-09-25 MED ORDER — LORAZEPAM 0.5 MG PO TAB
1 | ORAL_TABLET | ORAL | 0 refills | 12.00000 days | Status: AC | PRN
Start: 2017-09-25 — End: 2017-12-04

## 2017-09-25 MED ORDER — LIDOCAINE-PRILOCAINE 2.5-2.5 % TP CREA
1 refills | Status: AC
Start: 2017-09-25 — End: 2018-10-30
  Filled 2017-10-01 (×2): qty 30, 30d supply, fill #1

## 2017-09-25 MED ORDER — SULFAMETHOXAZOLE-TRIMETHOPRIM 800-160 MG PO TAB
1 | ORAL_TABLET | Freq: Two times a day (BID) | ORAL | 0 refills | Status: SS
Start: 2017-09-25 — End: 2017-09-28

## 2017-09-25 MED ORDER — PROCHLORPERAZINE MALEATE 10 MG PO TAB
10 mg | ORAL_TABLET | ORAL | 1 refills | Status: AC | PRN
Start: 2017-09-25 — End: 2018-03-19
  Filled 2017-10-01 (×2): qty 30, 8d supply, fill #1

## 2017-09-26 ENCOUNTER — Encounter: Admit: 2017-09-26 | Discharge: 2017-09-26 | Payer: MEDICARE

## 2017-09-26 ENCOUNTER — Encounter: Admit: 2017-09-26 | Discharge: 2017-09-26 | Payer: Commercial Managed Care - PPO

## 2017-09-26 DIAGNOSIS — Z87898 Personal history of other specified conditions: ICD-10-CM

## 2017-09-26 DIAGNOSIS — M549 Dorsalgia, unspecified: ICD-10-CM

## 2017-09-26 DIAGNOSIS — M109 Gout, unspecified: ICD-10-CM

## 2017-09-26 DIAGNOSIS — M1990 Unspecified osteoarthritis, unspecified site: Principal | ICD-10-CM

## 2017-09-26 DIAGNOSIS — K929 Disease of digestive system, unspecified: ICD-10-CM

## 2017-09-26 DIAGNOSIS — J45909 Unspecified asthma, uncomplicated: ICD-10-CM

## 2017-09-26 DIAGNOSIS — G4730 Sleep apnea, unspecified: ICD-10-CM

## 2017-09-26 DIAGNOSIS — C50411 Malignant neoplasm of upper-outer quadrant of right female breast: ICD-10-CM

## 2017-09-26 DIAGNOSIS — M797 Fibromyalgia: ICD-10-CM

## 2017-09-26 DIAGNOSIS — H9190 Unspecified hearing loss, unspecified ear: ICD-10-CM

## 2017-09-27 ENCOUNTER — Encounter: Admit: 2017-09-27 | Discharge: 2017-09-27 | Payer: Commercial Managed Care - PPO

## 2017-09-27 ENCOUNTER — Inpatient Hospital Stay: Admit: 2017-09-27 | Discharge: 2017-09-27 | Payer: Commercial Managed Care - PPO

## 2017-09-27 ENCOUNTER — Encounter: Admit: 2017-09-27 | Discharge: 2017-09-27 | Payer: MEDICARE

## 2017-09-27 DIAGNOSIS — N61 Mastitis without abscess: ICD-10-CM

## 2017-09-27 DIAGNOSIS — G4730 Sleep apnea, unspecified: ICD-10-CM

## 2017-09-27 DIAGNOSIS — M1990 Unspecified osteoarthritis, unspecified site: Principal | ICD-10-CM

## 2017-09-27 DIAGNOSIS — M109 Gout, unspecified: ICD-10-CM

## 2017-09-27 DIAGNOSIS — Z87898 Personal history of other specified conditions: ICD-10-CM

## 2017-09-27 DIAGNOSIS — M797 Fibromyalgia: ICD-10-CM

## 2017-09-27 DIAGNOSIS — J45909 Unspecified asthma, uncomplicated: ICD-10-CM

## 2017-09-27 DIAGNOSIS — H9190 Unspecified hearing loss, unspecified ear: ICD-10-CM

## 2017-09-27 DIAGNOSIS — T8579XA Infection and inflammatory reaction due to other internal prosthetic devices, implants and grafts, initial encounter: Principal | ICD-10-CM

## 2017-09-27 DIAGNOSIS — K929 Disease of digestive system, unspecified: ICD-10-CM

## 2017-09-27 DIAGNOSIS — M549 Dorsalgia, unspecified: ICD-10-CM

## 2017-09-27 LAB — BASIC METABOLIC PANEL
Lab: 0.9 mg/dL — ABNORMAL LOW (ref 0.4–1.00)
Lab: 107 MMOL/L — ABNORMAL LOW (ref 98–110)
Lab: 139 MMOL/L — ABNORMAL LOW (ref 137–147)
Lab: 156 mg/dL — ABNORMAL HIGH (ref 70–100)
Lab: 18 mg/dL (ref 7–25)
Lab: 24 MMOL/L (ref 21–30)
Lab: 3.4 MMOL/L — ABNORMAL LOW (ref 3.5–5.1)
Lab: 58 mL/min — ABNORMAL LOW (ref >60)
Lab: 8 pg (ref 3–12)
Lab: 8.9 mg/dL (ref 8.5–10.6)
Lab: >60 mL/min — ABNORMAL LOW (ref >60)

## 2017-09-27 LAB — CBC AND DIFF
Lab: 0 % (ref 0–2)
Lab: 0 10*3/uL (ref 0–0.20)
Lab: 0.2 10*3/uL (ref 0–0.45)
Lab: 0.9 10*3/uL — ABNORMAL HIGH (ref 0–0.80)
Lab: 1.6 10*3/uL (ref 1.0–4.8)
Lab: 12 % (ref 4–12)
Lab: 2 % (ref 0–5)
Lab: 4.6 10*3/uL (ref 1.8–7.0)
Lab: 7.2 10*3/uL (ref 4.5–11.0)

## 2017-09-27 MED ORDER — DOCUSATE SODIUM 100 MG PO CAP
100 mg | Freq: Every day | ORAL | 0 refills | Status: DC | PRN
Start: 2017-09-27 — End: 2017-09-29
  Administered 2017-09-27: 23:00:00 100 mg via ORAL

## 2017-09-27 MED ORDER — ONDANSETRON HCL 4 MG PO TAB
4 mg | ORAL | 0 refills | Status: DC | PRN
Start: 2017-09-27 — End: 2017-09-29

## 2017-09-27 MED ORDER — ACETAMINOPHEN 325 MG PO TAB
650 mg | ORAL | 0 refills | Status: DC | PRN
Start: 2017-09-27 — End: 2017-09-29
  Administered 2017-09-27 – 2017-09-29 (×5): 650 mg via ORAL

## 2017-09-27 MED ORDER — VANCOMYCIN PHARMACY TO MANAGE
1 | 0 refills | Status: DC
Start: 2017-09-27 — End: 2017-09-29

## 2017-09-27 MED ORDER — PIPERACILLIN/TAZOBACTAM 4.5 G/NS IVPB (MB+)
4.5 g | INTRAVENOUS | 0 refills | Status: DC
Start: 2017-09-27 — End: 2017-09-29
  Administered 2017-09-27 – 2017-09-29 (×12): 4.5 g via INTRAVENOUS

## 2017-09-27 MED ORDER — PANTOPRAZOLE 20 MG PO TBEC
40 mg | Freq: Every day | ORAL | 0 refills | Status: DC
Start: 2017-09-27 — End: 2017-09-29
  Administered 2017-09-27 – 2017-09-29 (×3): 40 mg via ORAL

## 2017-09-27 MED ORDER — DIPHENHYDRAMINE HCL 25 MG PO CAP
25-50 mg | ORAL | 0 refills | Status: DC | PRN
Start: 2017-09-27 — End: 2017-09-29
  Administered 2017-09-28 – 2017-09-29 (×3): 25 mg via ORAL

## 2017-09-27 MED ORDER — ENOXAPARIN 40 MG/0.4 ML SC SYRG
40 mg | Freq: Every day | SUBCUTANEOUS | 0 refills | Status: DC
Start: 2017-09-27 — End: 2017-09-29

## 2017-09-27 MED ORDER — OXYCODONE 5 MG PO TAB
5-10 mg | ORAL | 0 refills | Status: DC | PRN
Start: 2017-09-27 — End: 2017-09-29

## 2017-09-27 MED ORDER — SIMVASTATIN 20 MG PO TAB
20 mg | Freq: Every day | ORAL | 0 refills | Status: DC
Start: 2017-09-27 — End: 2017-09-29
  Administered 2017-09-27 – 2017-09-29 (×3): 20 mg via ORAL

## 2017-09-27 MED ORDER — ONDANSETRON HCL (PF) 4 MG/2 ML IJ SOLN
4 mg | INTRAVENOUS | 0 refills | Status: DC | PRN
Start: 2017-09-27 — End: 2017-09-29

## 2017-09-27 MED ORDER — VANCOMYCIN 1G/250ML D5W IVPB (VIAL2BAG)
15 mg/kg | Freq: Every day | INTRAVENOUS | 0 refills | Status: DC
Start: 2017-09-27 — End: 2017-09-28
  Administered 2017-09-28 (×2): 1000 mg via INTRAVENOUS

## 2017-09-28 ENCOUNTER — Encounter: Admit: 2017-09-28 | Discharge: 2017-09-28 | Payer: Commercial Managed Care - PPO

## 2017-09-28 ENCOUNTER — Encounter: Admit: 2017-09-28 | Discharge: 2017-09-28 | Payer: MEDICARE

## 2017-09-28 ENCOUNTER — Ambulatory Visit: Admit: 2017-09-28 | Discharge: 2017-09-28 | Payer: MEDICARE

## 2017-09-28 LAB — BASIC METABOLIC PANEL: Lab: 142 MMOL/L — ABNORMAL LOW (ref 137–147)

## 2017-09-28 LAB — GRAM STAIN

## 2017-09-28 LAB — CBC AND DIFF: Lab: 6.7 K/UL — ABNORMAL LOW (ref >60)

## 2017-09-28 MED ORDER — VANCOMYCIN IN DEXTROSE 5 % 750 MG/150 ML IV PGBK
750 mg | Freq: Two times a day (BID) | INTRAVENOUS | 0 refills | Status: DC
Start: 2017-09-28 — End: 2017-09-29
  Administered 2017-09-28 – 2017-09-29 (×2): 750 mg via INTRAVENOUS

## 2017-09-29 ENCOUNTER — Encounter: Admit: 2017-09-25 | Discharge: 2017-09-25 | Payer: MEDICARE

## 2017-09-29 ENCOUNTER — Encounter: Admit: 2017-09-29 | Discharge: 2017-09-29 | Payer: MEDICARE

## 2017-09-29 ENCOUNTER — Ambulatory Visit: Admit: 2017-09-27 | Discharge: 2017-09-27 | Payer: Commercial Managed Care - PPO

## 2017-09-29 ENCOUNTER — Encounter: Admit: 2017-09-29 | Discharge: 2017-09-29 | Payer: Commercial Managed Care - PPO

## 2017-09-29 ENCOUNTER — Ambulatory Visit: Admit: 2017-09-26 | Discharge: 2017-09-26 | Payer: Commercial Managed Care - PPO

## 2017-09-29 ENCOUNTER — Inpatient Hospital Stay
Admit: 2017-09-27 | Discharge: 2017-09-29 | Disposition: A | Discharge status: Home / Self Care | Payer: Commercial Managed Care - PPO | Source: Ambulatory Visit

## 2017-09-29 DIAGNOSIS — Z9011 Acquired absence of right breast and nipple: ICD-10-CM

## 2017-09-29 DIAGNOSIS — Z881 Allergy status to other antibiotic agents status: ICD-10-CM

## 2017-09-29 DIAGNOSIS — Z90710 Acquired absence of both cervix and uterus: ICD-10-CM

## 2017-09-29 DIAGNOSIS — T8579XA Infection and inflammatory reaction due to other internal prosthetic devices, implants and grafts, initial encounter: Principal | ICD-10-CM

## 2017-09-29 DIAGNOSIS — Z79899 Other long term (current) drug therapy: ICD-10-CM

## 2017-09-29 DIAGNOSIS — L7634 Postprocedural seroma of skin and subcutaneous tissue following other procedure: ICD-10-CM

## 2017-09-29 DIAGNOSIS — Z9884 Bariatric surgery status: ICD-10-CM

## 2017-09-29 DIAGNOSIS — G4730 Sleep apnea, unspecified: ICD-10-CM

## 2017-09-29 DIAGNOSIS — M797 Fibromyalgia: ICD-10-CM

## 2017-09-29 DIAGNOSIS — Z9049 Acquired absence of other specified parts of digestive tract: ICD-10-CM

## 2017-09-29 LAB — VRE SCREEN

## 2017-09-29 LAB — BASIC METABOLIC PANEL
Lab: 0.7 mg/dL (ref 0.4–1.00)
Lab: 10 mg/dL (ref 7–25)
Lab: 109 MMOL/L — ABNORMAL LOW (ref 98–110)
Lab: 143 MMOL/L — ABNORMAL LOW (ref 137–147)
Lab: 29 MMOL/L (ref 21–30)
Lab: 3.9 MMOL/L — ABNORMAL LOW (ref 3.5–5.1)
Lab: 5 pg (ref 3–12)
Lab: 8.6 mg/dL (ref 8.5–10.6)
Lab: 85 mg/dL (ref 70–100)
Lab: >60 mL/min (ref >60)
Lab: >60 mL/min (ref >60)

## 2017-09-29 LAB — CBC AND DIFF
Lab: 0 10*3/uL (ref 0–0.20)
Lab: 0.3 10*3/uL (ref 0–0.45)
Lab: 0.7 10*3/uL (ref 0–0.80)
Lab: 2 10*3/uL (ref 1.0–4.8)
Lab: 3 10*3/uL (ref 1.8–7.0)
Lab: 5 % (ref 0–5)
Lab: 6 10*3/uL (ref 4.5–11.0)

## 2017-09-29 LAB — MRSA SCREEN

## 2017-09-29 MED ORDER — LOPERAMIDE 2 MG PO CAP
ORAL_CAPSULE | ORAL | 0 refills | 10.00000 days | Status: AC
Start: 2017-09-29 — End: ?

## 2017-09-29 MED ORDER — ALLOPURINOL 100 MG PO TAB
300 mg | Freq: Every day | ORAL | 0 refills | Status: DC
Start: 2017-09-29 — End: 2017-09-29

## 2017-09-29 MED ORDER — HYDROCODONE-ACETAMINOPHEN 5-325 MG PO TAB
1-2 | ORAL_TABLET | ORAL | 0 refills | 30.00000 days | Status: AC | PRN
Start: 2017-09-29 — End: 2017-10-09

## 2017-09-29 MED ORDER — GABAPENTIN 300 MG PO CAP
300 mg | Freq: Two times a day (BID) | ORAL | 0 refills | Status: DC
Start: 2017-09-29 — End: 2017-09-29
  Administered 2017-09-29: 15:00:00 300 mg via ORAL

## 2017-09-29 MED ORDER — ALLOPURINOL 100 MG PO TAB
100 mg | Freq: Every day | ORAL | 0 refills | Status: DC
Start: 2017-09-29 — End: 2017-09-29
  Administered 2017-09-29: 15:00:00 100 mg via ORAL

## 2017-09-29 MED ORDER — LEVOFLOXACIN IN D5W 750 MG/150 ML IV PGBK
750 mg | Freq: Once | INTRAVENOUS | 0 refills | Status: CP
Start: 2017-09-29 — End: ?
  Administered 2017-09-29: 15:00:00 750 mg via INTRAVENOUS

## 2017-09-29 MED ORDER — LEVOFLOXACIN 750 MG PO TAB
750 mg | ORAL_TABLET | Freq: Every day | ORAL | 0 refills | 7.00000 days | Status: AC
Start: 2017-09-29 — End: ?

## 2017-09-29 MED ADMIN — HEPARIN, PORCINE (PF) 100 UNIT/ML IV SYRG [17362]: 500 [IU] | INTRAVENOUS | @ 19:00:00 | Stop: 2017-09-29 | NDC 08290306515

## 2017-09-30 ENCOUNTER — Encounter: Admit: 2017-09-30 | Discharge: 2017-09-30 | Payer: MEDICARE

## 2017-10-01 ENCOUNTER — Encounter: Admit: 2017-10-01 | Discharge: 2017-10-01 | Payer: MEDICARE

## 2017-10-01 ENCOUNTER — Encounter: Admit: 2017-10-01 | Discharge: 2017-10-01 | Payer: Commercial Managed Care - PPO

## 2017-10-01 ENCOUNTER — Ambulatory Visit: Admit: 2017-10-01 | Discharge: 2017-10-01 | Payer: Commercial Managed Care - PPO

## 2017-10-01 DIAGNOSIS — L7634 Postprocedural seroma of skin and subcutaneous tissue following other procedure: ICD-10-CM

## 2017-10-01 DIAGNOSIS — N61 Mastitis without abscess: Principal | ICD-10-CM

## 2017-10-01 DIAGNOSIS — Z170 Estrogen receptor positive status [ER+]: ICD-10-CM

## 2017-10-01 DIAGNOSIS — M797 Fibromyalgia: ICD-10-CM

## 2017-10-01 DIAGNOSIS — J45909 Unspecified asthma, uncomplicated: ICD-10-CM

## 2017-10-01 DIAGNOSIS — G4730 Sleep apnea, unspecified: ICD-10-CM

## 2017-10-01 DIAGNOSIS — Z79899 Other long term (current) drug therapy: ICD-10-CM

## 2017-10-01 DIAGNOSIS — K929 Disease of digestive system, unspecified: ICD-10-CM

## 2017-10-01 DIAGNOSIS — C50411 Malignant neoplasm of upper-outer quadrant of right female breast: ICD-10-CM

## 2017-10-01 DIAGNOSIS — M109 Gout, unspecified: ICD-10-CM

## 2017-10-01 DIAGNOSIS — M1990 Unspecified osteoarthritis, unspecified site: Principal | ICD-10-CM

## 2017-10-01 DIAGNOSIS — Z87898 Personal history of other specified conditions: ICD-10-CM

## 2017-10-01 DIAGNOSIS — K219 Gastro-esophageal reflux disease without esophagitis: ICD-10-CM

## 2017-10-01 DIAGNOSIS — Z9884 Bariatric surgery status: ICD-10-CM

## 2017-10-01 DIAGNOSIS — Z8249 Family history of ischemic heart disease and other diseases of the circulatory system: ICD-10-CM

## 2017-10-01 DIAGNOSIS — H9190 Unspecified hearing loss, unspecified ear: ICD-10-CM

## 2017-10-01 DIAGNOSIS — N6489 Other specified disorders of breast: ICD-10-CM

## 2017-10-01 DIAGNOSIS — M549 Dorsalgia, unspecified: ICD-10-CM

## 2017-10-01 LAB — CULTURE-WOUND/TISSUE/FLUID(AEROBIC ONLY)W/SENSITIVITY

## 2017-10-01 MED ORDER — FENTANYL CITRATE (PF) 50 MCG/ML IJ SOLN
25-50 ug | Freq: Once | INTRAVENOUS | 0 refills | Status: CP
Start: 2017-10-01 — End: ?
  Administered 2017-10-01: 19:00:00 50 ug via INTRAVENOUS

## 2017-10-01 MED ORDER — HEPARIN, PORCINE (PF) 100 UNIT/ML IV SYRG
500 [IU] | Freq: Once | 0 refills | Status: CP
Start: 2017-10-01 — End: ?

## 2017-10-01 MED ORDER — MIDAZOLAM 1 MG/ML IJ SOLN
0 refills | Status: CP
  Administered 2017-10-01: 19:00:00 1 mg via INTRAVENOUS

## 2017-10-01 MED ORDER — SODIUM CHLORIDE 0.9 % IV SOLP
0 refills | Status: CP
  Administered 2017-10-01: 19:00:00 250 mL via INTRAVENOUS

## 2017-10-01 MED ORDER — SODIUM CHLORIDE 0.9 % IJ SYRG
2 refills | 30.00000 days | Status: AC
Start: 2017-10-01 — End: 2018-03-19
  Filled 2017-10-01 (×2): qty 250, 30d supply, fill #1

## 2017-10-01 MED ORDER — MIDAZOLAM 1 MG/ML IJ SOLN
1-2 mg | Freq: Once | INTRAVENOUS | 0 refills | Status: CP
Start: 2017-10-01 — End: ?
  Administered 2017-10-01: 19:00:00 1 mg via INTRAVENOUS

## 2017-10-02 ENCOUNTER — Encounter: Admit: 2017-10-02 | Discharge: 2017-10-02 | Payer: Commercial Managed Care - PPO

## 2017-10-02 LAB — CULTURE-ANAEROBIC

## 2017-10-03 ENCOUNTER — Encounter: Admit: 2017-10-03 | Discharge: 2017-10-03 | Payer: Commercial Managed Care - PPO

## 2017-10-08 ENCOUNTER — Encounter: Admit: 2017-10-08 | Discharge: 2017-10-08 | Payer: Commercial Managed Care - PPO

## 2017-10-08 ENCOUNTER — Encounter: Admit: 2017-10-08 | Discharge: 2017-10-08 | Payer: MEDICARE

## 2017-10-09 ENCOUNTER — Ambulatory Visit: Admit: 2017-10-09 | Discharge: 2017-10-10 | Payer: Commercial Managed Care - PPO

## 2017-10-09 ENCOUNTER — Encounter: Admit: 2017-10-09 | Discharge: 2017-10-09 | Payer: MEDICARE

## 2017-10-09 DIAGNOSIS — K929 Disease of digestive system, unspecified: ICD-10-CM

## 2017-10-09 DIAGNOSIS — M797 Fibromyalgia: ICD-10-CM

## 2017-10-09 DIAGNOSIS — J45909 Unspecified asthma, uncomplicated: ICD-10-CM

## 2017-10-09 DIAGNOSIS — G4730 Sleep apnea, unspecified: ICD-10-CM

## 2017-10-09 DIAGNOSIS — H9190 Unspecified hearing loss, unspecified ear: ICD-10-CM

## 2017-10-09 DIAGNOSIS — M1990 Unspecified osteoarthritis, unspecified site: Principal | ICD-10-CM

## 2017-10-09 DIAGNOSIS — M109 Gout, unspecified: ICD-10-CM

## 2017-10-09 DIAGNOSIS — Z87898 Personal history of other specified conditions: ICD-10-CM

## 2017-10-09 DIAGNOSIS — M549 Dorsalgia, unspecified: ICD-10-CM

## 2017-10-09 MED ORDER — LEVOFLOXACIN 500 MG PO TAB
750 mg | ORAL_TABLET | Freq: Every day | ORAL | 0 refills | 7.00000 days | Status: AC
Start: 2017-10-09 — End: ?

## 2017-10-10 ENCOUNTER — Ambulatory Visit: Admit: 2017-09-27 | Discharge: 2017-09-27 | Payer: MEDICARE

## 2017-10-10 ENCOUNTER — Encounter: Admit: 2017-10-10 | Discharge: 2017-10-10 | Payer: MEDICARE

## 2017-10-10 ENCOUNTER — Encounter: Admit: 2017-10-10 | Discharge: 2017-10-10 | Payer: Commercial Managed Care - PPO

## 2017-10-10 ENCOUNTER — Ambulatory Visit: Admit: 2017-10-10 | Discharge: 2017-10-10 | Payer: Commercial Managed Care - PPO

## 2017-10-10 DIAGNOSIS — N641 Fat necrosis of breast: ICD-10-CM

## 2017-10-10 DIAGNOSIS — M549 Dorsalgia, unspecified: ICD-10-CM

## 2017-10-10 DIAGNOSIS — M797 Fibromyalgia: ICD-10-CM

## 2017-10-10 DIAGNOSIS — C50411 Malignant neoplasm of upper-outer quadrant of right female breast: ICD-10-CM

## 2017-10-10 DIAGNOSIS — H9190 Unspecified hearing loss, unspecified ear: ICD-10-CM

## 2017-10-10 DIAGNOSIS — M109 Gout, unspecified: ICD-10-CM

## 2017-10-10 DIAGNOSIS — Z170 Estrogen receptor positive status [ER+]: ICD-10-CM

## 2017-10-10 DIAGNOSIS — M96843 Postprocedural seroma of a musculoskeletal structure following other procedure: ICD-10-CM

## 2017-10-10 DIAGNOSIS — G4730 Sleep apnea, unspecified: ICD-10-CM

## 2017-10-10 DIAGNOSIS — J45909 Unspecified asthma, uncomplicated: ICD-10-CM

## 2017-10-10 DIAGNOSIS — Z87898 Personal history of other specified conditions: ICD-10-CM

## 2017-10-10 DIAGNOSIS — K929 Disease of digestive system, unspecified: ICD-10-CM

## 2017-10-10 DIAGNOSIS — T8131XA Disruption of external operation (surgical) wound, not elsewhere classified, initial encounter: Principal | ICD-10-CM

## 2017-10-10 DIAGNOSIS — N61 Mastitis without abscess: Principal | ICD-10-CM

## 2017-10-10 DIAGNOSIS — M1990 Unspecified osteoarthritis, unspecified site: Principal | ICD-10-CM

## 2017-10-10 LAB — GRAM STAIN

## 2017-10-10 MED ORDER — PROPOFOL INJ 10 MG/ML IV VIAL
0 refills | Status: DC
Start: 2017-10-10 — End: 2017-10-10
  Administered 2017-10-10: 17:00:00 130 mg via INTRAVENOUS

## 2017-10-10 MED ORDER — LIDOCAINE (PF) 200 MG/10 ML (2 %) IJ SYRG
0 refills | Status: DC
Start: 2017-10-10 — End: 2017-10-10
  Administered 2017-10-10: 17:00:00 60 mg via INTRAVENOUS

## 2017-10-10 MED ORDER — FENTANYL CITRATE (PF) 50 MCG/ML IJ SOLN
0 refills | Status: DC
Start: 2017-10-10 — End: 2017-10-10
  Administered 2017-10-10: 17:00:00 50 ug via INTRAVENOUS

## 2017-10-10 MED ORDER — LACTATED RINGERS IV SOLP
1000 mL | INTRAVENOUS | 0 refills | Status: DC
Start: 2017-10-10 — End: 2017-10-10

## 2017-10-10 MED ORDER — HYDROMORPHONE (PF) 2 MG/ML IJ SYRG
.5 mg | INTRAVENOUS | 0 refills | Status: DC | PRN
Start: 2017-10-10 — End: 2017-10-10

## 2017-10-10 MED ORDER — THROMBIN (BOVINE) 20,000 UNIT TP SPRY
0 refills | Status: DC
Start: 2017-10-10 — End: 2017-10-10
  Administered 2017-10-10: 17:00:00 1 via TOPICAL

## 2017-10-10 MED ORDER — ONDANSETRON HCL (PF) 4 MG/2 ML IJ SOLN
INTRAVENOUS | 0 refills | Status: DC
Start: 2017-10-10 — End: 2017-10-10
  Administered 2017-10-10: 18:00:00 4 mg via INTRAVENOUS

## 2017-10-10 MED ORDER — SUCCINYLCHOLINE CHLORIDE 20 MG/ML IJ SOLN
INTRAVENOUS | 0 refills | Status: DC
Start: 2017-10-10 — End: 2017-10-10
  Administered 2017-10-10: 17:00:00 100 mg via INTRAVENOUS

## 2017-10-10 MED ORDER — MIDAZOLAM 1 MG/ML IJ SOLN
INTRAVENOUS | 0 refills | Status: DC
Start: 2017-10-10 — End: 2017-10-10
  Administered 2017-10-10: 17:00:00 2 mg via INTRAVENOUS

## 2017-10-10 MED ORDER — LIDOCAINE (PF) 10 MG/ML (1 %) IJ SOLN
.1-2 mL | INTRAMUSCULAR | 0 refills | Status: DC | PRN
Start: 2017-10-10 — End: 2017-10-10

## 2017-10-10 MED ORDER — DEXTRAN 70-HYPROMELLOSE (PF) 0.1-0.3 % OP DPET
0 refills | Status: DC
Start: 2017-10-10 — End: 2017-10-10
  Administered 2017-10-10: 17:00:00 2 [drp] via OPHTHALMIC

## 2017-10-10 MED ORDER — DIAZEPAM 5 MG PO TAB
5 mg | ORAL_TABLET | ORAL | 0 refills | 7.00000 days | Status: AC | PRN
Start: 2017-10-10 — End: ?
  Filled 2017-10-10 (×2): qty 10, 3d supply, fill #1

## 2017-10-10 MED ORDER — CEFAZOLIN 1 GRAM IJ SOLR
0 refills | Status: DC
Start: 2017-10-10 — End: 2017-10-10
  Administered 2017-10-10: 17:00:00 2 g via INTRAVENOUS

## 2017-10-10 MED ORDER — DEXAMETHASONE SODIUM PHOSPHATE 4 MG/ML IJ SOLN
INTRAVENOUS | 0 refills | Status: DC
Start: 2017-10-10 — End: 2017-10-10
  Administered 2017-10-10: 17:00:00 4 mg via INTRAVENOUS

## 2017-10-10 MED ORDER — PHENYLEPHRINE IN 0.9% NACL(PF) 1 MG/10 ML (100 MCG/ML) IV SYRG
INTRAVENOUS | 0 refills | Status: DC
Start: 2017-10-10 — End: 2017-10-10
  Administered 2017-10-10 (×5): 100 ug via INTRAVENOUS

## 2017-10-10 MED ORDER — DIPHENHYDRAMINE HCL 50 MG/ML IJ SOLN
25 mg | Freq: Once | INTRAVENOUS | 0 refills | Status: CP | PRN
Start: 2017-10-10 — End: ?
  Administered 2017-10-10: 18:00:00 25 mg via INTRAVENOUS

## 2017-10-10 MED ORDER — EPHEDRINE SULFATE 50 MG/5ML SYR (10 MG/ML) (AN)(OSM)
0 refills | Status: DC
Start: 2017-10-10 — End: 2017-10-10
  Administered 2017-10-10 (×2): 10 mg via INTRAVENOUS

## 2017-10-10 MED ORDER — FENTANYL CITRATE (PF) 50 MCG/ML IJ SOLN
25 ug | INTRAVENOUS | 0 refills | Status: DC | PRN
Start: 2017-10-10 — End: 2017-10-10

## 2017-10-10 MED ADMIN — LACTATED RINGERS IV SOLP [4318]: 1000 mL | INTRAVENOUS | @ 17:00:00 | Stop: 2017-10-10 | NDC 00338011704

## 2017-10-11 ENCOUNTER — Ambulatory Visit: Admit: 2017-10-11 | Discharge: 2017-10-11 | Payer: Commercial Managed Care - PPO

## 2017-10-11 ENCOUNTER — Encounter: Admit: 2017-10-11 | Discharge: 2017-10-11 | Payer: MEDICARE

## 2017-10-11 DIAGNOSIS — Z170 Estrogen receptor positive status [ER+]: ICD-10-CM

## 2017-10-11 DIAGNOSIS — C50411 Malignant neoplasm of upper-outer quadrant of right female breast: Principal | ICD-10-CM

## 2017-10-11 MED ORDER — HEPARIN, PORCINE (PF) 100 UNIT/ML IV SYRG
500 [IU] | Freq: Once | 0 refills | Status: CP
Start: 2017-10-11 — End: ?

## 2017-10-11 MED ORDER — IOHEXOL 350 MG IODINE/ML IV SOLN
100 mL | Freq: Once | INTRAVENOUS | 0 refills | Status: CP
Start: 2017-10-11 — End: ?
  Administered 2017-10-11: 16:00:00 100 mL via INTRAVENOUS

## 2017-10-11 MED ORDER — SODIUM CHLORIDE 0.9 % IJ SOLN
50 mL | Freq: Once | INTRAVENOUS | 0 refills | Status: CP
Start: 2017-10-11 — End: ?
  Administered 2017-10-11: 16:00:00 50 mL via INTRAVENOUS

## 2017-10-12 LAB — CULTURE-WOUND/TISSUE/FLUID(AEROBIC ONLY)W/SENSITIVITY

## 2017-10-15 LAB — CULTURE-FUNGAL,OTHER

## 2017-10-15 LAB — CULTURE-ANAEROBIC

## 2017-10-17 ENCOUNTER — Ambulatory Visit: Admit: 2017-10-17 | Discharge: 2017-10-18 | Payer: Commercial Managed Care - PPO

## 2017-10-17 ENCOUNTER — Encounter: Admit: 2017-10-17 | Discharge: 2017-10-17 | Payer: MEDICARE

## 2017-10-17 DIAGNOSIS — M109 Gout, unspecified: ICD-10-CM

## 2017-10-17 DIAGNOSIS — Z87898 Personal history of other specified conditions: ICD-10-CM

## 2017-10-17 DIAGNOSIS — J45909 Unspecified asthma, uncomplicated: ICD-10-CM

## 2017-10-17 DIAGNOSIS — H9190 Unspecified hearing loss, unspecified ear: ICD-10-CM

## 2017-10-17 DIAGNOSIS — M1990 Unspecified osteoarthritis, unspecified site: Principal | ICD-10-CM

## 2017-10-17 DIAGNOSIS — K929 Disease of digestive system, unspecified: ICD-10-CM

## 2017-10-17 DIAGNOSIS — M797 Fibromyalgia: ICD-10-CM

## 2017-10-17 DIAGNOSIS — M549 Dorsalgia, unspecified: ICD-10-CM

## 2017-10-17 DIAGNOSIS — G4730 Sleep apnea, unspecified: ICD-10-CM

## 2017-10-18 DIAGNOSIS — Z170 Estrogen receptor positive status [ER+]: ICD-10-CM

## 2017-10-18 DIAGNOSIS — C50411 Malignant neoplasm of upper-outer quadrant of right female breast: Principal | ICD-10-CM

## 2017-10-18 DIAGNOSIS — N61 Mastitis without abscess: ICD-10-CM

## 2017-10-21 ENCOUNTER — Encounter: Admit: 2017-10-21 | Discharge: 2017-10-21 | Payer: Commercial Managed Care - PPO

## 2017-10-21 ENCOUNTER — Encounter: Admit: 2017-10-21 | Discharge: 2017-10-21 | Payer: MEDICARE

## 2017-10-21 MED ORDER — OLANZAPINE 10 MG PO TBDI
10 mg | ORAL_TABLET | Freq: Every evening | ORAL | 0 refills | 30.00000 days | Status: AC
Start: 2017-10-21 — End: 2018-09-01

## 2017-10-23 ENCOUNTER — Encounter: Admit: 2017-10-23 | Discharge: 2017-10-23 | Payer: Commercial Managed Care - PPO

## 2017-10-24 ENCOUNTER — Encounter: Admit: 2017-10-24 | Discharge: 2017-10-24 | Payer: Commercial Managed Care - PPO

## 2017-10-24 ENCOUNTER — Encounter: Admit: 2017-10-24 | Discharge: 2017-10-24 | Payer: MEDICARE

## 2017-10-24 ENCOUNTER — Ambulatory Visit: Admit: 2017-10-24 | Discharge: 2017-10-24 | Payer: MEDICARE

## 2017-10-24 ENCOUNTER — Encounter: Admit: 2017-10-24 | Discharge: 2017-10-25 | Payer: MEDICARE

## 2017-10-24 DIAGNOSIS — Z170 Estrogen receptor positive status [ER+]: ICD-10-CM

## 2017-10-24 DIAGNOSIS — J45909 Unspecified asthma, uncomplicated: ICD-10-CM

## 2017-10-24 DIAGNOSIS — C50411 Malignant neoplasm of upper-outer quadrant of right female breast: Principal | ICD-10-CM

## 2017-10-24 DIAGNOSIS — D0511 Intraductal carcinoma in situ of right breast: ICD-10-CM

## 2017-10-24 DIAGNOSIS — H9190 Unspecified hearing loss, unspecified ear: ICD-10-CM

## 2017-10-24 DIAGNOSIS — Z9011 Acquired absence of right breast and nipple: ICD-10-CM

## 2017-10-24 DIAGNOSIS — M109 Gout, unspecified: ICD-10-CM

## 2017-10-24 DIAGNOSIS — K929 Disease of digestive system, unspecified: ICD-10-CM

## 2017-10-24 DIAGNOSIS — G4730 Sleep apnea, unspecified: ICD-10-CM

## 2017-10-24 DIAGNOSIS — M797 Fibromyalgia: ICD-10-CM

## 2017-10-24 DIAGNOSIS — Z87898 Personal history of other specified conditions: ICD-10-CM

## 2017-10-24 DIAGNOSIS — M549 Dorsalgia, unspecified: ICD-10-CM

## 2017-10-24 DIAGNOSIS — M1990 Unspecified osteoarthritis, unspecified site: Principal | ICD-10-CM

## 2017-10-24 DIAGNOSIS — Z9889 Other specified postprocedural states: ICD-10-CM

## 2017-10-24 LAB — CBC AND DIFF: Lab: 4.8 10*3/uL (ref 4.5–11.0)

## 2017-10-24 LAB — COMPREHENSIVE METABOLIC PANEL
Lab: 0.7 mg/dL (ref 0.4–1.00)
Lab: 109 MMOL/L — ABNORMAL LOW (ref 98–110)
Lab: 13 mg/dL (ref 7–25)
Lab: 144 MMOL/L — ABNORMAL LOW (ref 137–147)
Lab: 4.1 MMOL/L — ABNORMAL LOW (ref 3.5–5.1)
Lab: 8.8 mg/dL (ref 8.5–10.6)
Lab: 81 mg/dL (ref 70–100)

## 2017-10-24 MED ORDER — CYCLOPHOSPHAMIDE IVPB
600 mg/m2 | Freq: Once | INTRAVENOUS | 0 refills | Status: CP
Start: 2017-10-24 — End: ?
  Administered 2017-10-24 (×2): 1000 mg via INTRAVENOUS

## 2017-10-24 MED ORDER — DOXORUBICIN 2 MG/ML IV SOLN
60 mg/m2 | Freq: Once | INTRAVENOUS | 0 refills | Status: CP
Start: 2017-10-24 — End: ?
  Administered 2017-10-24: 17:00:00 103.8 mg via INTRAVENOUS

## 2017-10-24 MED ORDER — PALONOSETRON 0.25 MG/5 ML IV SOLN
.25 mg | Freq: Once | INTRAVENOUS | 0 refills | Status: CP
Start: 2017-10-24 — End: ?
  Administered 2017-10-24: 16:00:00 0.25 mg via INTRAVENOUS

## 2017-10-24 MED ORDER — HEPARIN, PORCINE (PF) 100 UNIT/ML IV SYRG
500 [IU] | Freq: Once | 0 refills | Status: CP
Start: 2017-10-24 — End: ?

## 2017-10-24 MED ORDER — APREPITANT 7.2 MG/ML IV EMUL
130 mg | Freq: Once | INTRAVENOUS | 0 refills | Status: CP
Start: 2017-10-24 — End: ?
  Administered 2017-10-24: 16:00:00 130 mg via INTRAVENOUS

## 2017-10-25 ENCOUNTER — Encounter: Admit: 2017-10-25 | Discharge: 2017-10-25 | Payer: MEDICARE

## 2017-10-25 ENCOUNTER — Encounter: Admit: 2017-10-25 | Discharge: 2017-10-25 | Payer: Commercial Managed Care - PPO

## 2017-10-28 ENCOUNTER — Encounter: Admit: 2017-10-28 | Discharge: 2017-10-28 | Payer: Commercial Managed Care - PPO

## 2017-10-30 ENCOUNTER — Encounter: Admit: 2017-10-30 | Discharge: 2017-10-30 | Payer: Commercial Managed Care - PPO

## 2017-10-30 ENCOUNTER — Encounter: Admit: 2017-10-30 | Discharge: 2017-10-30 | Payer: MEDICARE

## 2017-10-31 ENCOUNTER — Encounter: Admit: 2017-10-31 | Discharge: 2017-10-31 | Payer: MEDICARE

## 2017-10-31 ENCOUNTER — Ambulatory Visit: Admit: 2017-10-31 | Discharge: 2017-11-01 | Payer: Commercial Managed Care - PPO

## 2017-11-01 DIAGNOSIS — C50411 Malignant neoplasm of upper-outer quadrant of right female breast: Principal | ICD-10-CM

## 2017-11-01 DIAGNOSIS — Z170 Estrogen receptor positive status [ER+]: ICD-10-CM

## 2017-11-05 ENCOUNTER — Encounter: Admit: 2017-11-05 | Discharge: 2017-11-05 | Payer: Commercial Managed Care - PPO

## 2017-11-05 MED ORDER — DOXORUBICIN 2 MG/ML IV SOLN
60 mg/m2 | Freq: Once | INTRAVENOUS | 0 refills | Status: CN
Start: 2017-11-05 — End: ?

## 2017-11-05 MED ORDER — APREPITANT 7.2 MG/ML IV EMUL
130 mg | Freq: Once | INTRAVENOUS | 0 refills | Status: CN
Start: 2017-11-05 — End: ?

## 2017-11-05 MED ORDER — PALONOSETRON 0.25 MG/5 ML IV SOLN
.25 mg | Freq: Once | INTRAVENOUS | 0 refills | Status: CN
Start: 2017-11-05 — End: ?

## 2017-11-05 MED ORDER — PEGFILGRASTIM-CBQV 6 MG/0.6 ML SC SYRG
6 mg | Freq: Once | SUBCUTANEOUS | 0 refills | Status: CN
Start: 2017-11-05 — End: ?

## 2017-11-05 MED ORDER — CYCLOPHOSPHAMIDE IVPB
600 mg/m2 | Freq: Once | INTRAVENOUS | 0 refills | Status: CN
Start: 2017-11-05 — End: ?

## 2017-11-07 ENCOUNTER — Encounter: Admit: 2017-11-07 | Discharge: 2017-11-07 | Payer: Commercial Managed Care - PPO

## 2017-11-07 ENCOUNTER — Encounter: Admit: 2017-11-07 | Discharge: 2017-11-08 | Payer: Commercial Managed Care - PPO

## 2017-11-07 ENCOUNTER — Encounter: Admit: 2017-11-07 | Discharge: 2017-11-07 | Payer: MEDICARE

## 2017-11-07 ENCOUNTER — Ambulatory Visit: Admit: 2017-11-07 | Discharge: 2017-11-07 | Payer: MEDICARE

## 2017-11-07 DIAGNOSIS — R110 Nausea: ICD-10-CM

## 2017-11-07 DIAGNOSIS — D0511 Intraductal carcinoma in situ of right breast: ICD-10-CM

## 2017-11-07 DIAGNOSIS — H9190 Unspecified hearing loss, unspecified ear: ICD-10-CM

## 2017-11-07 DIAGNOSIS — C50411 Malignant neoplasm of upper-outer quadrant of right female breast: Principal | ICD-10-CM

## 2017-11-07 DIAGNOSIS — G4730 Sleep apnea, unspecified: ICD-10-CM

## 2017-11-07 DIAGNOSIS — Z87898 Personal history of other specified conditions: ICD-10-CM

## 2017-11-07 DIAGNOSIS — M1990 Unspecified osteoarthritis, unspecified site: Principal | ICD-10-CM

## 2017-11-07 DIAGNOSIS — Z170 Estrogen receptor positive status [ER+]: ICD-10-CM

## 2017-11-07 DIAGNOSIS — Z9884 Bariatric surgery status: ICD-10-CM

## 2017-11-07 DIAGNOSIS — K929 Disease of digestive system, unspecified: ICD-10-CM

## 2017-11-07 DIAGNOSIS — M549 Dorsalgia, unspecified: ICD-10-CM

## 2017-11-07 DIAGNOSIS — Z5111 Encounter for antineoplastic chemotherapy: ICD-10-CM

## 2017-11-07 DIAGNOSIS — M109 Gout, unspecified: ICD-10-CM

## 2017-11-07 DIAGNOSIS — Z9011 Acquired absence of right breast and nipple: ICD-10-CM

## 2017-11-07 DIAGNOSIS — J45909 Unspecified asthma, uncomplicated: ICD-10-CM

## 2017-11-07 DIAGNOSIS — M797 Fibromyalgia: ICD-10-CM

## 2017-11-07 MED ORDER — PALONOSETRON 0.25 MG/5 ML IV SOLN
.25 mg | Freq: Once | INTRAVENOUS | 0 refills | Status: CP
Start: 2017-11-07 — End: ?
  Administered 2017-11-07: 16:00:00 0.25 mg via INTRAVENOUS

## 2017-11-07 MED ORDER — HEPARIN, PORCINE (PF) 100 UNIT/ML IV SYRG
500 [IU] | Freq: Once | 0 refills | Status: CP
Start: 2017-11-07 — End: ?

## 2017-11-07 MED ORDER — CYCLOPHOSPHAMIDE IVPB
600 mg/m2 | Freq: Once | INTRAVENOUS | 0 refills | Status: CP
Start: 2017-11-07 — End: ?
  Administered 2017-11-07 (×2): 1000 mg via INTRAVENOUS

## 2017-11-07 MED ORDER — APREPITANT 7.2 MG/ML IV EMUL
130 mg | Freq: Once | INTRAVENOUS | 0 refills | Status: CP
Start: 2017-11-07 — End: ?
  Administered 2017-11-07: 16:00:00 130 mg via INTRAVENOUS

## 2017-11-07 MED ORDER — DOXORUBICIN 2 MG/ML IV SOLN
60 mg/m2 | Freq: Once | INTRAVENOUS | 0 refills | Status: CP
Start: 2017-11-07 — End: ?
  Administered 2017-11-07: 17:00:00 103.8 mg via INTRAVENOUS

## 2017-11-14 DIAGNOSIS — C50411 Malignant neoplasm of upper-outer quadrant of right female breast: Principal | ICD-10-CM

## 2017-11-15 ENCOUNTER — Ambulatory Visit: Admit: 2017-11-14 | Discharge: 2017-11-15 | Payer: Commercial Managed Care - PPO

## 2017-11-15 DIAGNOSIS — Z170 Estrogen receptor positive status [ER+]: ICD-10-CM

## 2017-11-19 ENCOUNTER — Encounter: Admit: 2017-11-19 | Discharge: 2017-11-19 | Payer: MEDICARE

## 2017-11-19 MED ORDER — PEGFILGRASTIM-CBQV 6 MG/0.6 ML SC SYRG
6 mg | Freq: Once | SUBCUTANEOUS | 0 refills | Status: CN
Start: 2017-11-19 — End: ?

## 2017-11-19 MED ORDER — PALONOSETRON 0.25 MG/5 ML IV SOLN
.25 mg | Freq: Once | INTRAVENOUS | 0 refills | Status: CN
Start: 2017-11-19 — End: ?

## 2017-11-19 MED ORDER — DOXORUBICIN 2 MG/ML IV SOLN
60 mg/m2 | Freq: Once | INTRAVENOUS | 0 refills | Status: CN
Start: 2017-11-19 — End: ?

## 2017-11-19 MED ORDER — CYCLOPHOSPHAMIDE IVPB
600 mg/m2 | Freq: Once | INTRAVENOUS | 0 refills | Status: CN
Start: 2017-11-19 — End: ?

## 2017-11-19 MED ORDER — APREPITANT 7.2 MG/ML IV EMUL
130 mg | Freq: Once | INTRAVENOUS | 0 refills | Status: CN
Start: 2017-11-19 — End: ?

## 2017-11-20 ENCOUNTER — Encounter: Admit: 2017-11-20 | Discharge: 2017-11-20 | Payer: Commercial Managed Care - PPO

## 2017-11-21 ENCOUNTER — Encounter: Admit: 2017-11-21 | Discharge: 2017-11-21 | Payer: Commercial Managed Care - PPO

## 2017-11-21 ENCOUNTER — Encounter: Admit: 2017-11-21 | Discharge: 2017-11-21 | Payer: MEDICARE

## 2017-11-21 ENCOUNTER — Encounter: Admit: 2017-11-21 | Discharge: 2017-11-22 | Payer: MEDICARE

## 2017-11-21 ENCOUNTER — Ambulatory Visit: Admit: 2017-11-21 | Discharge: 2017-11-21 | Payer: MEDICARE

## 2017-11-21 DIAGNOSIS — M549 Dorsalgia, unspecified: ICD-10-CM

## 2017-11-21 DIAGNOSIS — Z87898 Personal history of other specified conditions: ICD-10-CM

## 2017-11-21 DIAGNOSIS — C50411 Malignant neoplasm of upper-outer quadrant of right female breast: Principal | ICD-10-CM

## 2017-11-21 DIAGNOSIS — H9190 Unspecified hearing loss, unspecified ear: ICD-10-CM

## 2017-11-21 DIAGNOSIS — D0511 Intraductal carcinoma in situ of right breast: ICD-10-CM

## 2017-11-21 DIAGNOSIS — Z5111 Encounter for antineoplastic chemotherapy: ICD-10-CM

## 2017-11-21 DIAGNOSIS — Z9011 Acquired absence of right breast and nipple: ICD-10-CM

## 2017-11-21 DIAGNOSIS — R232 Flushing: ICD-10-CM

## 2017-11-21 DIAGNOSIS — Z170 Estrogen receptor positive status [ER+]: ICD-10-CM

## 2017-11-21 DIAGNOSIS — M109 Gout, unspecified: ICD-10-CM

## 2017-11-21 DIAGNOSIS — G4730 Sleep apnea, unspecified: ICD-10-CM

## 2017-11-21 DIAGNOSIS — M797 Fibromyalgia: ICD-10-CM

## 2017-11-21 DIAGNOSIS — K929 Disease of digestive system, unspecified: ICD-10-CM

## 2017-11-21 DIAGNOSIS — J45909 Unspecified asthma, uncomplicated: ICD-10-CM

## 2017-11-21 DIAGNOSIS — M1990 Unspecified osteoarthritis, unspecified site: Principal | ICD-10-CM

## 2017-11-21 LAB — CBC AND DIFF
Lab: 11 10*3/uL — ABNORMAL HIGH (ref 1.8–7.0)
Lab: 14 10*3/uL — ABNORMAL HIGH (ref 4.5–11.0)
Lab: 5 % — ABNORMAL LOW (ref 24–44)
Lab: 9.2 FL — ABNORMAL LOW (ref 7–11)

## 2017-11-21 LAB — COMPREHENSIVE METABOLIC PANEL
Lab: 0.6 mg/dL (ref 0.4–1.00)
Lab: 106 MMOL/L — ABNORMAL LOW (ref 98–110)
Lab: 12 U/L (ref 7–56)
Lab: 143 MMOL/L — ABNORMAL LOW (ref 137–147)
Lab: 21 mg/dL (ref 7–25)
Lab: 28 MMOL/L (ref 21–30)
Lab: 3.7 g/dL (ref 3.5–5.0)
Lab: 4.1 MMOL/L — ABNORMAL LOW (ref 3.5–5.1)
Lab: 5.7 g/dL — ABNORMAL LOW (ref 6.0–8.0)
Lab: 89 U/L (ref 25–110)
Lab: 9 % (ref 3–12)
Lab: 9.1 mg/dL (ref 8.5–10.6)
Lab: 93 mg/dL (ref 70–100)
Lab: >60 mL/min (ref >60)
Lab: >60 mL/min (ref >60)

## 2017-11-21 MED ORDER — DOXORUBICIN 2 MG/ML IV SOLN
60 mg/m2 | Freq: Once | INTRAVENOUS | 0 refills | Status: CP
Start: 2017-11-21 — End: ?
  Administered 2017-11-21: 18:00:00 103.8 mg via INTRAVENOUS

## 2017-11-21 MED ORDER — CYCLOPHOSPHAMIDE IVPB
600 mg/m2 | Freq: Once | INTRAVENOUS | 0 refills | Status: CP
Start: 2017-11-21 — End: ?
  Administered 2017-11-21 (×2): 1000 mg via INTRAVENOUS

## 2017-11-21 MED ORDER — HEPARIN, PORCINE (PF) 100 UNIT/ML IV SYRG
500 [IU] | Freq: Once | 0 refills | Status: CP
Start: 2017-11-21 — End: ?

## 2017-11-21 MED ORDER — PALONOSETRON 0.25 MG/5 ML IV SOLN
.25 mg | Freq: Once | INTRAVENOUS | 0 refills | Status: CP
Start: 2017-11-21 — End: ?
  Administered 2017-11-21: 17:00:00 0.25 mg via INTRAVENOUS

## 2017-11-21 MED ORDER — APREPITANT 7.2 MG/ML IV EMUL
130 mg | Freq: Once | INTRAVENOUS | 0 refills | Status: CP
Start: 2017-11-21 — End: ?
  Administered 2017-11-21: 17:00:00 130 mg via INTRAVENOUS

## 2017-12-02 ENCOUNTER — Encounter: Admit: 2017-12-02 | Discharge: 2017-12-02 | Payer: MEDICARE

## 2017-12-02 MED ORDER — PEGFILGRASTIM-CBQV 6 MG/0.6 ML SC SYRG
6 mg | Freq: Once | SUBCUTANEOUS | 0 refills | Status: CN
Start: 2017-12-02 — End: ?

## 2017-12-02 MED ORDER — DOXORUBICIN 2 MG/ML IV SOLN
60 mg/m2 | Freq: Once | INTRAVENOUS | 0 refills | Status: CN
Start: 2017-12-02 — End: ?

## 2017-12-02 MED ORDER — APREPITANT 7.2 MG/ML IV EMUL
130 mg | Freq: Once | INTRAVENOUS | 0 refills | Status: CN
Start: 2017-12-02 — End: ?

## 2017-12-02 MED ORDER — PALONOSETRON 0.25 MG/5 ML IV SOLN
.25 mg | Freq: Once | INTRAVENOUS | 0 refills | Status: CN
Start: 2017-12-02 — End: ?

## 2017-12-02 MED ORDER — CYCLOPHOSPHAMIDE IVPB
600 mg/m2 | Freq: Once | INTRAVENOUS | 0 refills | Status: CN
Start: 2017-12-02 — End: ?

## 2017-12-04 ENCOUNTER — Encounter: Admit: 2017-12-04 | Discharge: 2017-12-04 | Payer: MEDICARE

## 2017-12-04 ENCOUNTER — Encounter: Admit: 2017-12-04 | Discharge: 2017-12-04 | Payer: Commercial Managed Care - PPO

## 2017-12-04 DIAGNOSIS — C50411 Malignant neoplasm of upper-outer quadrant of right female breast: Principal | ICD-10-CM

## 2017-12-04 DIAGNOSIS — M549 Dorsalgia, unspecified: ICD-10-CM

## 2017-12-04 DIAGNOSIS — H9190 Unspecified hearing loss, unspecified ear: ICD-10-CM

## 2017-12-04 DIAGNOSIS — Z170 Estrogen receptor positive status [ER+]: ICD-10-CM

## 2017-12-04 DIAGNOSIS — R42 Dizziness and giddiness: ICD-10-CM

## 2017-12-04 DIAGNOSIS — R110 Nausea: ICD-10-CM

## 2017-12-04 DIAGNOSIS — M109 Gout, unspecified: ICD-10-CM

## 2017-12-04 DIAGNOSIS — M1990 Unspecified osteoarthritis, unspecified site: Principal | ICD-10-CM

## 2017-12-04 DIAGNOSIS — J45909 Unspecified asthma, uncomplicated: ICD-10-CM

## 2017-12-04 DIAGNOSIS — M797 Fibromyalgia: ICD-10-CM

## 2017-12-04 DIAGNOSIS — G4730 Sleep apnea, unspecified: ICD-10-CM

## 2017-12-04 DIAGNOSIS — Z87898 Personal history of other specified conditions: ICD-10-CM

## 2017-12-04 DIAGNOSIS — K929 Disease of digestive system, unspecified: ICD-10-CM

## 2017-12-04 LAB — CBC AND DIFF
Lab: 0 10*3/uL (ref 0–0.20)
Lab: 0 10*3/uL (ref 0–0.45)

## 2017-12-04 LAB — COMPREHENSIVE METABOLIC PANEL
Lab: 11 10*3/uL — ABNORMAL HIGH (ref 3–12)
Lab: 12 U/L (ref 7–56)
Lab: 27 MMOL/L (ref 21–30)
Lab: >60 mL/min — ABNORMAL HIGH (ref >60)
Lab: >60 mL/min — ABNORMAL LOW (ref >60)

## 2017-12-04 MED ORDER — HEPARIN, PORCINE (PF) 100 UNIT/ML IV SYRG
500 [IU] | Freq: Once | 0 refills | Status: CP
Start: 2017-12-04 — End: ?

## 2017-12-04 MED ORDER — LORAZEPAM 0.5 MG PO TAB
1 | ORAL_TABLET | ORAL | 0 refills | 12.00000 days | Status: AC | PRN
Start: 2017-12-04 — End: 2018-10-28

## 2017-12-04 MED ORDER — DOXORUBICIN 2 MG/ML IV SOLN
60 mg/m2 | Freq: Once | INTRAVENOUS | 0 refills | Status: CP
Start: 2017-12-04 — End: ?
  Administered 2017-12-04: 17:00:00 103.8 mg via INTRAVENOUS

## 2017-12-04 MED ORDER — CYCLOPHOSPHAMIDE IVPB
600 mg/m2 | Freq: Once | INTRAVENOUS | 0 refills | Status: CP
Start: 2017-12-04 — End: ?
  Administered 2017-12-04 (×2): 1000 mg via INTRAVENOUS

## 2017-12-04 MED ORDER — APREPITANT 7.2 MG/ML IV EMUL
130 mg | Freq: Once | INTRAVENOUS | 0 refills | Status: CP
Start: 2017-12-04 — End: ?
  Administered 2017-12-04: 17:00:00 130 mg via INTRAVENOUS

## 2017-12-04 MED ORDER — PALONOSETRON 0.25 MG/5 ML IV SOLN
.25 mg | Freq: Once | INTRAVENOUS | 0 refills | Status: CP
Start: 2017-12-04 — End: ?
  Administered 2017-12-04: 17:00:00 0.25 mg via INTRAVENOUS

## 2017-12-18 ENCOUNTER — Encounter: Admit: 2017-12-18 | Discharge: 2017-12-18 | Payer: MEDICARE

## 2017-12-20 ENCOUNTER — Encounter: Admit: 2017-12-20 | Discharge: 2017-12-20 | Payer: MEDICARE

## 2017-12-23 ENCOUNTER — Encounter: Admit: 2017-12-23 | Discharge: 2017-12-23 | Payer: MEDICARE

## 2017-12-23 ENCOUNTER — Ambulatory Visit: Admit: 2017-12-23 | Discharge: 2017-12-23 | Payer: MEDICARE

## 2017-12-23 ENCOUNTER — Encounter: Admit: 2017-12-23 | Discharge: 2017-12-23 | Payer: Commercial Managed Care - PPO

## 2017-12-23 DIAGNOSIS — Z9884 Bariatric surgery status: ICD-10-CM

## 2017-12-23 DIAGNOSIS — R110 Nausea: ICD-10-CM

## 2017-12-23 DIAGNOSIS — M549 Dorsalgia, unspecified: ICD-10-CM

## 2017-12-23 DIAGNOSIS — M797 Fibromyalgia: ICD-10-CM

## 2017-12-23 DIAGNOSIS — J45909 Unspecified asthma, uncomplicated: ICD-10-CM

## 2017-12-23 DIAGNOSIS — Z5111 Encounter for antineoplastic chemotherapy: ICD-10-CM

## 2017-12-23 DIAGNOSIS — Z9011 Acquired absence of right breast and nipple: ICD-10-CM

## 2017-12-23 DIAGNOSIS — M109 Gout, unspecified: ICD-10-CM

## 2017-12-23 DIAGNOSIS — K929 Disease of digestive system, unspecified: ICD-10-CM

## 2017-12-23 DIAGNOSIS — C50411 Malignant neoplasm of upper-outer quadrant of right female breast: Principal | ICD-10-CM

## 2017-12-23 DIAGNOSIS — G4730 Sleep apnea, unspecified: ICD-10-CM

## 2017-12-23 DIAGNOSIS — Z170 Estrogen receptor positive status [ER+]: ICD-10-CM

## 2017-12-23 DIAGNOSIS — M1990 Unspecified osteoarthritis, unspecified site: Principal | ICD-10-CM

## 2017-12-23 DIAGNOSIS — Z87898 Personal history of other specified conditions: ICD-10-CM

## 2017-12-23 DIAGNOSIS — H9190 Unspecified hearing loss, unspecified ear: ICD-10-CM

## 2017-12-23 LAB — CBC AND DIFF
Lab: 3 M/UL — ABNORMAL LOW (ref 4.0–5.0)
Lab: 4.9 K/UL (ref >60)

## 2017-12-23 LAB — COMPREHENSIVE METABOLIC PANEL
Lab: 0.3 mg/dL (ref 0.3–1.2)
Lab: 0.8 mg/dL — ABNORMAL HIGH (ref 0.4–1.00)
Lab: 105 MMOL/L (ref 98–110)
Lab: 143 MMOL/L — ABNORMAL LOW (ref 137–147)
Lab: 16 mg/dL (ref 7–25)
Lab: 22 U/L (ref 7–40)
Lab: 3.7 g/dL — ABNORMAL LOW (ref 3.5–5.0)
Lab: 4.2 MMOL/L — ABNORMAL LOW (ref 3.5–5.1)
Lab: 5.7 g/dL — ABNORMAL LOW (ref 6.0–8.0)
Lab: 60 U/L — ABNORMAL HIGH (ref 25–110)
Lab: 78 mg/dL (ref 70–100)
Lab: 9 mg/dL (ref 8.5–10.6)

## 2017-12-23 MED ORDER — SUCRALFATE 100 MG/ML PO SUSP
1 g | ORAL | 1 refills | Status: AC
Start: 2017-12-23 — End: 2018-01-13

## 2017-12-23 MED ORDER — ONDANSETRON IVPB
8 mg | Freq: Once | INTRAVENOUS | 0 refills | Status: CP
Start: 2017-12-23 — End: ?
  Administered 2017-12-23 (×2): 8 mg via INTRAVENOUS

## 2017-12-23 MED ORDER — DIPHENHYDRAMINE HCL 50 MG/ML IJ SOLN
50 mg | Freq: Once | INTRAVENOUS | 0 refills | Status: CN
Start: 2017-12-23 — End: ?

## 2017-12-23 MED ORDER — FAMOTIDINE (PF) 20 MG/2 ML IV SOLN
20 mg | Freq: Once | INTRAVENOUS | 0 refills | Status: CP
Start: 2017-12-23 — End: ?
  Administered 2017-12-23: 19:00:00 20 mg via INTRAVENOUS

## 2017-12-23 MED ORDER — HEPARIN, PORCINE (PF) 100 UNIT/ML IV SYRG
500 [IU] | Freq: Once | 0 refills | Status: CP
Start: 2017-12-23 — End: ?

## 2017-12-23 MED ORDER — DEXAMETHASONE IVPB
20 mg | Freq: Once | INTRAVENOUS | 0 refills | Status: CP
Start: 2017-12-23 — End: ?
  Administered 2017-12-23 (×2): 20 mg via INTRAVENOUS

## 2017-12-23 MED ORDER — PACLITAXEL IVPB
80 mg/m2 | Freq: Once | INTRAVENOUS | 0 refills | Status: CN
Start: 2017-12-23 — End: ?

## 2017-12-23 MED ORDER — DEXAMETHASONE IVPB
20 mg | Freq: Once | INTRAVENOUS | 0 refills | Status: CN
Start: 2017-12-23 — End: ?

## 2017-12-23 MED ORDER — FAMOTIDINE (PF) 20 MG/2 ML IV SOLN
20 mg | Freq: Once | INTRAVENOUS | 0 refills | Status: CN
Start: 2017-12-23 — End: ?

## 2017-12-23 MED ORDER — PACLITAXEL IVPB
80 mg/m2 | Freq: Once | INTRAVENOUS | 0 refills | Status: CP
Start: 2017-12-23 — End: ?
  Administered 2017-12-23 (×2): 138.402 mg via INTRAVENOUS

## 2017-12-23 MED ORDER — DIPHENHYDRAMINE HCL 50 MG/ML IJ SOLN
50 mg | Freq: Once | INTRAVENOUS | 0 refills | Status: CP
Start: 2017-12-23 — End: ?
  Administered 2017-12-23: 19:00:00 50 mg via INTRAVENOUS

## 2017-12-23 MED ORDER — DEXAMETHASONE IVPB
20 mg | Freq: Once | INTRAVENOUS | 0 refills | Status: DC
Start: 2017-12-23 — End: 2017-12-23

## 2017-12-24 ENCOUNTER — Encounter: Admit: 2017-12-24 | Discharge: 2017-12-24 | Payer: MEDICARE

## 2017-12-24 DIAGNOSIS — M1990 Unspecified osteoarthritis, unspecified site: Principal | ICD-10-CM

## 2017-12-24 DIAGNOSIS — M797 Fibromyalgia: ICD-10-CM

## 2017-12-24 DIAGNOSIS — J45909 Unspecified asthma, uncomplicated: ICD-10-CM

## 2017-12-24 DIAGNOSIS — M549 Dorsalgia, unspecified: ICD-10-CM

## 2017-12-24 DIAGNOSIS — K929 Disease of digestive system, unspecified: ICD-10-CM

## 2017-12-24 DIAGNOSIS — M109 Gout, unspecified: ICD-10-CM

## 2017-12-24 DIAGNOSIS — Z87898 Personal history of other specified conditions: ICD-10-CM

## 2017-12-24 DIAGNOSIS — G4730 Sleep apnea, unspecified: ICD-10-CM

## 2017-12-24 DIAGNOSIS — H9190 Unspecified hearing loss, unspecified ear: ICD-10-CM

## 2017-12-27 ENCOUNTER — Encounter: Admit: 2017-12-27 | Discharge: 2017-12-27 | Payer: MEDICARE

## 2017-12-27 MED ORDER — PACLITAXEL IVPB
80 mg/m2 | Freq: Once | INTRAVENOUS | 0 refills | Status: CN
Start: 2017-12-27 — End: ?

## 2017-12-27 MED ORDER — FAMOTIDINE (PF) 20 MG/2 ML IV SOLN
20 mg | Freq: Once | INTRAVENOUS | 0 refills | Status: CN
Start: 2017-12-27 — End: ?

## 2017-12-27 MED ORDER — DEXAMETHASONE IVPB
12 mg | Freq: Once | INTRAVENOUS | 0 refills | Status: CN
Start: 2017-12-27 — End: ?

## 2017-12-27 MED ORDER — DIPHENHYDRAMINE HCL 50 MG/ML IJ SOLN
25 mg | Freq: Once | INTRAVENOUS | 0 refills | Status: CN
Start: 2017-12-27 — End: ?

## 2017-12-30 ENCOUNTER — Encounter: Admit: 2017-12-30 | Discharge: 2017-12-30 | Payer: Commercial Managed Care - PPO

## 2017-12-30 ENCOUNTER — Encounter: Admit: 2017-12-30 | Discharge: 2017-12-30 | Payer: MEDICARE

## 2017-12-30 DIAGNOSIS — H9190 Unspecified hearing loss, unspecified ear: ICD-10-CM

## 2017-12-30 DIAGNOSIS — R110 Nausea: ICD-10-CM

## 2017-12-30 DIAGNOSIS — Z170 Estrogen receptor positive status [ER+]: ICD-10-CM

## 2017-12-30 DIAGNOSIS — M109 Gout, unspecified: ICD-10-CM

## 2017-12-30 DIAGNOSIS — M549 Dorsalgia, unspecified: ICD-10-CM

## 2017-12-30 DIAGNOSIS — M797 Fibromyalgia: ICD-10-CM

## 2017-12-30 DIAGNOSIS — M1990 Unspecified osteoarthritis, unspecified site: Principal | ICD-10-CM

## 2017-12-30 DIAGNOSIS — Z87898 Personal history of other specified conditions: ICD-10-CM

## 2017-12-30 DIAGNOSIS — C50411 Malignant neoplasm of upper-outer quadrant of right female breast: Principal | ICD-10-CM

## 2017-12-30 DIAGNOSIS — G4730 Sleep apnea, unspecified: ICD-10-CM

## 2017-12-30 DIAGNOSIS — J45909 Unspecified asthma, uncomplicated: ICD-10-CM

## 2017-12-30 DIAGNOSIS — K929 Disease of digestive system, unspecified: ICD-10-CM

## 2017-12-30 MED ORDER — ACETAMINOPHEN 500 MG PO TAB
1000 mg | Freq: Once | ORAL | 0 refills | Status: CP
Start: 2017-12-30 — End: ?
  Administered 2017-12-30: 16:00:00 1000 mg via ORAL

## 2017-12-30 MED ORDER — ONDANSETRON IVPB
8 mg | Freq: Once | INTRAVENOUS | 0 refills | Status: CP
Start: 2017-12-30 — End: ?
  Administered 2017-12-30 (×2): 8 mg via INTRAVENOUS

## 2017-12-30 MED ORDER — DIPHENHYDRAMINE HCL 50 MG/ML IJ SOLN
25 mg | Freq: Once | INTRAVENOUS | 0 refills | Status: CP
Start: 2017-12-30 — End: ?
  Administered 2017-12-30: 16:00:00 25 mg via INTRAVENOUS

## 2017-12-30 MED ORDER — DEXAMETHASONE IVPB
12 mg | Freq: Once | INTRAVENOUS | 0 refills | Status: CP
Start: 2017-12-30 — End: ?
  Administered 2017-12-30 (×2): 12 mg via INTRAVENOUS

## 2017-12-30 MED ORDER — FAMOTIDINE (PF) 20 MG/2 ML IV SOLN
20 mg | Freq: Once | INTRAVENOUS | 0 refills | Status: CP
Start: 2017-12-30 — End: ?
  Administered 2017-12-30: 16:00:00 20 mg via INTRAVENOUS

## 2017-12-30 MED ORDER — HEPARIN, PORCINE (PF) 100 UNIT/ML IV SYRG
500 [IU] | Freq: Once | 0 refills | Status: CP
Start: 2017-12-30 — End: ?

## 2017-12-30 MED ORDER — PACLITAXEL IVPB
80 mg/m2 | Freq: Once | INTRAVENOUS | 0 refills | Status: CP
Start: 2017-12-30 — End: ?
  Administered 2017-12-30 (×2): 129.6 mg via INTRAVENOUS

## 2018-01-06 ENCOUNTER — Encounter: Admit: 2018-01-06 | Discharge: 2018-01-06 | Payer: Commercial Managed Care - PPO

## 2018-01-06 ENCOUNTER — Encounter: Admit: 2018-01-06 | Discharge: 2018-01-06 | Payer: MEDICARE

## 2018-01-06 DIAGNOSIS — C50411 Malignant neoplasm of upper-outer quadrant of right female breast: Principal | ICD-10-CM

## 2018-01-06 DIAGNOSIS — Z170 Estrogen receptor positive status [ER+]: ICD-10-CM

## 2018-01-06 LAB — COMPREHENSIVE METABOLIC PANEL
Lab: 108 MMOL/L — ABNORMAL LOW (ref 98–110)
Lab: 143 MMOL/L — ABNORMAL LOW (ref 137–147)
Lab: 16 mg/dL (ref 7–25)
Lab: 4 MMOL/L — ABNORMAL LOW (ref 3.5–5.1)

## 2018-01-06 LAB — CBC AND DIFF: Lab: 2.7 10*3/uL — ABNORMAL LOW (ref 4.5–11.0)

## 2018-01-06 MED ORDER — PACLITAXEL IVPB
80 mg/m2 | Freq: Once | INTRAVENOUS | 0 refills | Status: CP
Start: 2018-01-06 — End: ?
  Administered 2018-01-06 (×2): 129.6 mg via INTRAVENOUS

## 2018-01-06 MED ORDER — DEXAMETHASONE IVPB
12 mg | Freq: Once | INTRAVENOUS | 0 refills | Status: CP
Start: 2018-01-06 — End: ?
  Administered 2018-01-06 (×2): 12 mg via INTRAVENOUS

## 2018-01-06 MED ORDER — FAMOTIDINE (PF) 20 MG/2 ML IV SOLN
20 mg | Freq: Once | INTRAVENOUS | 0 refills | Status: CP
Start: 2018-01-06 — End: ?
  Administered 2018-01-06: 15:00:00 20 mg via INTRAVENOUS

## 2018-01-06 MED ORDER — HEPARIN, PORCINE (PF) 100 UNIT/ML IV SYRG
500 [IU] | Freq: Once | 0 refills | Status: CP
Start: 2018-01-06 — End: ?

## 2018-01-06 MED ORDER — DIPHENHYDRAMINE HCL 50 MG/ML IJ SOLN
25 mg | Freq: Once | INTRAVENOUS | 0 refills | Status: CP
Start: 2018-01-06 — End: ?
  Administered 2018-01-06: 15:00:00 25 mg via INTRAVENOUS

## 2018-01-06 MED ORDER — ONDANSETRON IVPB
8 mg | Freq: Once | INTRAVENOUS | 0 refills | Status: CP
Start: 2018-01-06 — End: ?
  Administered 2018-01-06 (×2): 8 mg via INTRAVENOUS

## 2018-01-13 ENCOUNTER — Encounter: Admit: 2018-01-13 | Discharge: 2018-01-13 | Payer: MEDICARE

## 2018-01-13 ENCOUNTER — Encounter: Admit: 2018-01-13 | Discharge: 2018-01-13 | Payer: Commercial Managed Care - PPO

## 2018-01-13 ENCOUNTER — Encounter: Admit: 2018-01-13 | Discharge: 2018-01-14 | Payer: Commercial Managed Care - PPO

## 2018-01-13 DIAGNOSIS — C50411 Malignant neoplasm of upper-outer quadrant of right female breast: Secondary | ICD-10-CM

## 2018-01-13 DIAGNOSIS — Z87898 Personal history of other specified conditions: Secondary | ICD-10-CM

## 2018-01-13 DIAGNOSIS — J45909 Unspecified asthma, uncomplicated: Secondary | ICD-10-CM

## 2018-01-13 DIAGNOSIS — Z170 Estrogen receptor positive status [ER+]: Secondary | ICD-10-CM

## 2018-01-13 DIAGNOSIS — M1990 Unspecified osteoarthritis, unspecified site: Secondary | ICD-10-CM

## 2018-01-13 DIAGNOSIS — K929 Disease of digestive system, unspecified: Secondary | ICD-10-CM

## 2018-01-13 DIAGNOSIS — Z9011 Acquired absence of right breast and nipple: Secondary | ICD-10-CM

## 2018-01-13 DIAGNOSIS — G4730 Sleep apnea, unspecified: Secondary | ICD-10-CM

## 2018-01-13 DIAGNOSIS — M797 Fibromyalgia: Secondary | ICD-10-CM

## 2018-01-13 DIAGNOSIS — M109 Gout, unspecified: Secondary | ICD-10-CM

## 2018-01-13 DIAGNOSIS — Z5111 Encounter for antineoplastic chemotherapy: Secondary | ICD-10-CM

## 2018-01-13 DIAGNOSIS — M549 Dorsalgia, unspecified: Secondary | ICD-10-CM

## 2018-01-13 DIAGNOSIS — H9190 Unspecified hearing loss, unspecified ear: Secondary | ICD-10-CM

## 2018-01-13 DIAGNOSIS — D0511 Intraductal carcinoma in situ of right breast: Secondary | ICD-10-CM

## 2018-01-13 LAB — COMPREHENSIVE METABOLIC PANEL
Lab: 0.5 mg/dL (ref 0.4–1.00)
Lab: 105 MMOL/L — ABNORMAL LOW (ref 98–110)
Lab: 141 MMOL/L — ABNORMAL LOW (ref 137–147)
Lab: 3.7 MMOL/L — ABNORMAL LOW (ref 3.5–5.1)
Lab: 86 mg/dL — ABNORMAL HIGH (ref 70–100)

## 2018-01-13 LAB — CBC AND DIFF: Lab: 3.2 10*3/uL — ABNORMAL LOW (ref 4.5–11.0)

## 2018-01-13 MED ORDER — FAMOTIDINE (PF) 20 MG/2 ML IV SOLN
20 mg | Freq: Once | INTRAVENOUS | 0 refills | Status: CP
Start: 2018-01-13 — End: ?
  Administered 2018-01-13: 18:00:00 20 mg via INTRAVENOUS

## 2018-01-13 MED ORDER — DEXAMETHASONE IVPB
12 mg | Freq: Once | INTRAVENOUS | 0 refills | Status: CP
Start: 2018-01-13 — End: ?
  Administered 2018-01-13 (×2): 12 mg via INTRAVENOUS

## 2018-01-13 MED ORDER — SUCRALFATE 1 GRAM PO TAB
1 g | ORAL_TABLET | Freq: Three times a day (TID) | ORAL | 1 refills | Status: AC
Start: 2018-01-13 — End: 2019-04-23

## 2018-01-13 MED ORDER — ONDANSETRON IVPB
8 mg | Freq: Once | INTRAVENOUS | 0 refills | Status: CP
Start: 2018-01-13 — End: ?
  Administered 2018-01-13 (×2): 8 mg via INTRAVENOUS

## 2018-01-13 MED ORDER — PACLITAXEL IVPB
80 mg/m2 | Freq: Once | INTRAVENOUS | 0 refills | Status: CP
Start: 2018-01-13 — End: ?
  Administered 2018-01-13 (×2): 129.6 mg via INTRAVENOUS

## 2018-01-13 MED ORDER — DIPHENHYDRAMINE HCL 50 MG/ML IJ SOLN
25 mg | Freq: Once | INTRAVENOUS | 0 refills | Status: CP
Start: 2018-01-13 — End: ?
  Administered 2018-01-13: 19:00:00 25 mg via INTRAVENOUS

## 2018-01-13 MED ORDER — HEPARIN, PORCINE (PF) 100 UNIT/ML IV SYRG
500 [IU] | Freq: Once | 0 refills | Status: CP
Start: 2018-01-13 — End: ?

## 2018-01-13 MED FILL — ONDANSETRON HCL 4 MG PO TAB: 4 mg | ORAL | 5 days supply | Qty: 21 | Fill #3 | Status: CP

## 2018-01-13 MED FILL — ONDANSETRON HCL 4 MG PO TAB: 4 mg | ORAL | 2 days supply | Qty: 21 | Fill #2 | Status: CP

## 2018-01-16 ENCOUNTER — Encounter: Admit: 2018-01-16 | Discharge: 2018-01-16 | Payer: MEDICARE

## 2018-01-16 ENCOUNTER — Encounter: Admit: 2018-01-16 | Discharge: 2018-01-16 | Payer: Commercial Managed Care - PPO

## 2018-01-16 MED ORDER — DEXAMETHASONE IVPB
12 mg | Freq: Once | INTRAVENOUS | 0 refills | Status: CN
Start: 2018-01-16 — End: ?

## 2018-01-16 MED ORDER — FAMOTIDINE (PF) 20 MG/2 ML IV SOLN
20 mg | Freq: Once | INTRAVENOUS | 0 refills | Status: CN
Start: 2018-01-16 — End: ?

## 2018-01-16 MED ORDER — PACLITAXEL IVPB
80 mg/m2 | Freq: Once | INTRAVENOUS | 0 refills | Status: CN
Start: 2018-01-16 — End: ?

## 2018-01-16 MED ORDER — DIPHENHYDRAMINE HCL 50 MG/ML IJ SOLN
25 mg | Freq: Once | INTRAVENOUS | 0 refills | Status: CN
Start: 2018-01-16 — End: ?

## 2018-01-17 ENCOUNTER — Encounter: Admit: 2018-01-17 | Discharge: 2018-01-17 | Payer: Commercial Managed Care - PPO

## 2018-01-20 ENCOUNTER — Encounter: Admit: 2018-01-20 | Discharge: 2018-01-20 | Payer: Commercial Managed Care - PPO

## 2018-01-20 DIAGNOSIS — D0511 Intraductal carcinoma in situ of right breast: Secondary | ICD-10-CM

## 2018-01-20 DIAGNOSIS — C50411 Malignant neoplasm of upper-outer quadrant of right female breast: Secondary | ICD-10-CM

## 2018-01-20 DIAGNOSIS — Z9011 Acquired absence of right breast and nipple: Secondary | ICD-10-CM

## 2018-01-20 DIAGNOSIS — Z170 Estrogen receptor positive status [ER+]: Secondary | ICD-10-CM

## 2018-01-20 LAB — CBC AND DIFF
Lab: 0.1 10*3/uL (ref 0–0.45)
Lab: 4 10*3/uL — ABNORMAL LOW (ref 4.5–11.0)

## 2018-01-20 LAB — COMPREHENSIVE METABOLIC PANEL
Lab: 0.4 mg/dL (ref 0.3–1.2)
Lab: 0.5 mg/dL (ref 0.4–1.00)
Lab: 106 MMOL/L — ABNORMAL LOW (ref 98–110)
Lab: 141 MMOL/L — ABNORMAL LOW (ref 137–147)
Lab: 15 mg/dL (ref 7–25)
Lab: 21 U/L (ref 7–56)
Lab: 23 U/L (ref 7–40)
Lab: 3.5 g/dL (ref 3.5–5.0)
Lab: 3.9 MMOL/L — ABNORMAL LOW (ref 3.5–5.1)
Lab: 30 MMOL/L (ref 21–30)
Lab: 5 10*3/uL (ref 3–12)
Lab: 5.5 g/dL — ABNORMAL LOW (ref 6.0–8.0)
Lab: 51 U/L — ABNORMAL LOW (ref 25–110)
Lab: 8.8 mg/dL — ABNORMAL HIGH (ref 8.5–10.6)
Lab: 87 mg/dL — ABNORMAL HIGH (ref 70–100)
Lab: >60 mL/min (ref >60)
Lab: >60 mL/min — ABNORMAL LOW (ref >60)

## 2018-01-20 MED ORDER — DEXAMETHASONE IVPB
12 mg | Freq: Once | INTRAVENOUS | 0 refills | Status: CP
Start: 2018-01-20 — End: ?
  Administered 2018-01-20 (×2): 12 mg via INTRAVENOUS

## 2018-01-20 MED ORDER — ONDANSETRON HCL (PF) 4 MG/2 ML IJ SOLN
8 mg | Freq: Once | INTRAVENOUS | 0 refills | Status: CP
Start: 2018-01-20 — End: ?
  Administered 2018-01-20: 20:00:00 8 mg via INTRAVENOUS

## 2018-01-20 MED ORDER — FAMOTIDINE (PF) 20 MG/2 ML IV SOLN
20 mg | Freq: Once | INTRAVENOUS | 0 refills | Status: CP
Start: 2018-01-20 — End: ?

## 2018-01-20 MED ORDER — HEPARIN, PORCINE (PF) 100 UNIT/ML IV SYRG
500 [IU] | Freq: Once | 0 refills | Status: CP
Start: 2018-01-20 — End: ?

## 2018-01-20 MED ORDER — DIPHENHYDRAMINE HCL 50 MG/ML IJ SOLN
25 mg | Freq: Once | INTRAVENOUS | 0 refills | Status: CP
Start: 2018-01-20 — End: ?
  Administered 2018-01-20: 20:00:00 25 mg via INTRAVENOUS

## 2018-01-20 MED ORDER — PACLITAXEL IVPB
80 mg/m2 | Freq: Once | INTRAVENOUS | 0 refills | Status: CP
Start: 2018-01-20 — End: ?
  Administered 2018-01-20 (×2): 129.6 mg via INTRAVENOUS

## 2018-01-27 ENCOUNTER — Encounter: Admit: 2018-01-27 | Discharge: 2018-01-27 | Payer: Commercial Managed Care - PPO

## 2018-01-27 DIAGNOSIS — C50411 Malignant neoplasm of upper-outer quadrant of right female breast: Secondary | ICD-10-CM

## 2018-01-27 DIAGNOSIS — Z170 Estrogen receptor positive status [ER+]: Secondary | ICD-10-CM

## 2018-01-27 DIAGNOSIS — Z9011 Acquired absence of right breast and nipple: Secondary | ICD-10-CM

## 2018-01-27 DIAGNOSIS — D0511 Intraductal carcinoma in situ of right breast: Secondary | ICD-10-CM

## 2018-01-27 LAB — COMPREHENSIVE METABOLIC PANEL
Lab: 0.4 mg/dL (ref 0.3–1.2)
Lab: 0.5 mg/dL (ref 0.4–1.00)
Lab: 142 MMOL/L — ABNORMAL LOW (ref 137–147)
Lab: 18 U/L (ref 7–40)
Lab: 28 MMOL/L (ref 21–30)
Lab: 3.2 g/dL — ABNORMAL LOW (ref 3.5–5.0)
Lab: 3.8 MMOL/L — ABNORMAL LOW (ref 3.5–5.1)
Lab: 41 U/L — ABNORMAL LOW (ref 25–110)
Lab: 5.2 g/dL — ABNORMAL LOW (ref 6.0–8.0)
Lab: 8.6 mg/dL — ABNORMAL HIGH (ref 8.5–10.6)
Lab: 84 mg/dL — ABNORMAL HIGH (ref 70–100)

## 2018-01-27 LAB — CBC AND DIFF: Lab: 3.1 10*3/uL — ABNORMAL LOW (ref 4.5–11.0)

## 2018-01-27 MED ORDER — ONDANSETRON HCL (PF) 4 MG/2 ML IJ SOLN
8 mg | Freq: Once | INTRAVENOUS | 0 refills | Status: CP
Start: 2018-01-27 — End: ?
  Administered 2018-01-27: 15:00:00 8 mg via INTRAVENOUS

## 2018-01-27 MED ORDER — DEXAMETHASONE IVPB
12 mg | Freq: Once | INTRAVENOUS | 0 refills | Status: CP
Start: 2018-01-27 — End: ?
  Administered 2018-01-27 (×2): 12 mg via INTRAVENOUS

## 2018-01-27 MED ORDER — FAMOTIDINE (PF) 20 MG/2 ML IV SOLN
20 mg | Freq: Once | INTRAVENOUS | 0 refills | Status: CP
Start: 2018-01-27 — End: ?
  Administered 2018-01-27: 15:00:00 20 mg via INTRAVENOUS

## 2018-01-27 MED ORDER — HEPARIN, PORCINE (PF) 100 UNIT/ML IV SYRG
500 [IU] | Freq: Once | 0 refills | Status: CP
Start: 2018-01-27 — End: ?

## 2018-01-27 MED ORDER — DIPHENHYDRAMINE HCL 50 MG/ML IJ SOLN
25 mg | Freq: Once | INTRAVENOUS | 0 refills | Status: CP
Start: 2018-01-27 — End: ?
  Administered 2018-01-27: 15:00:00 25 mg via INTRAVENOUS

## 2018-01-27 MED ORDER — PACLITAXEL IVPB
80 mg/m2 | Freq: Once | INTRAVENOUS | 0 refills | Status: CP
Start: 2018-01-27 — End: ?
  Administered 2018-01-27 (×2): 129.6 mg via INTRAVENOUS

## 2018-01-30 ENCOUNTER — Encounter: Admit: 2018-01-30 | Discharge: 2018-01-30 | Payer: MEDICARE

## 2018-01-30 MED ORDER — PACLITAXEL IVPB
80 mg/m2 | Freq: Once | INTRAVENOUS | 0 refills | Status: CN
Start: 2018-01-30 — End: ?

## 2018-01-30 MED ORDER — DIPHENHYDRAMINE HCL 50 MG/ML IJ SOLN
25 mg | Freq: Once | INTRAVENOUS | 0 refills | Status: CN
Start: 2018-01-30 — End: ?

## 2018-01-30 MED ORDER — DEXAMETHASONE IVPB
12 mg | Freq: Once | INTRAVENOUS | 0 refills | Status: CN
Start: 2018-01-30 — End: ?

## 2018-01-30 MED ORDER — FAMOTIDINE (PF) 20 MG/2 ML IV SOLN
20 mg | Freq: Once | INTRAVENOUS | 0 refills | Status: CN
Start: 2018-01-30 — End: ?

## 2018-02-03 ENCOUNTER — Encounter: Admit: 2018-02-03 | Discharge: 2018-02-03 | Payer: Commercial Managed Care - PPO

## 2018-02-03 ENCOUNTER — Encounter: Admit: 2018-02-03 | Discharge: 2018-02-03 | Payer: MEDICARE

## 2018-02-03 DIAGNOSIS — Z9011 Acquired absence of right breast and nipple: Secondary | ICD-10-CM

## 2018-02-03 DIAGNOSIS — Z9884 Bariatric surgery status: Secondary | ICD-10-CM

## 2018-02-03 DIAGNOSIS — H9190 Unspecified hearing loss, unspecified ear: Secondary | ICD-10-CM

## 2018-02-03 DIAGNOSIS — Z8249 Family history of ischemic heart disease and other diseases of the circulatory system: Secondary | ICD-10-CM

## 2018-02-03 DIAGNOSIS — M109 Gout, unspecified: Secondary | ICD-10-CM

## 2018-02-03 DIAGNOSIS — D509 Iron deficiency anemia, unspecified: Secondary | ICD-10-CM

## 2018-02-03 DIAGNOSIS — M1990 Unspecified osteoarthritis, unspecified site: Secondary | ICD-10-CM

## 2018-02-03 DIAGNOSIS — R42 Dizziness and giddiness: Secondary | ICD-10-CM

## 2018-02-03 DIAGNOSIS — R5383 Other fatigue: Secondary | ICD-10-CM

## 2018-02-03 DIAGNOSIS — M797 Fibromyalgia: Secondary | ICD-10-CM

## 2018-02-03 DIAGNOSIS — J45909 Unspecified asthma, uncomplicated: Secondary | ICD-10-CM

## 2018-02-03 DIAGNOSIS — C50411 Malignant neoplasm of upper-outer quadrant of right female breast: Secondary | ICD-10-CM

## 2018-02-03 DIAGNOSIS — G4730 Sleep apnea, unspecified: Secondary | ICD-10-CM

## 2018-02-03 DIAGNOSIS — Z87898 Personal history of other specified conditions: Secondary | ICD-10-CM

## 2018-02-03 DIAGNOSIS — D0511 Intraductal carcinoma in situ of right breast: Secondary | ICD-10-CM

## 2018-02-03 DIAGNOSIS — R110 Nausea: Secondary | ICD-10-CM

## 2018-02-03 DIAGNOSIS — M549 Dorsalgia, unspecified: Secondary | ICD-10-CM

## 2018-02-03 DIAGNOSIS — Z170 Estrogen receptor positive status [ER+]: Secondary | ICD-10-CM

## 2018-02-03 DIAGNOSIS — K929 Disease of digestive system, unspecified: Secondary | ICD-10-CM

## 2018-02-03 LAB — COMPREHENSIVE METABOLIC PANEL
Lab: 0.5 mg/dL (ref 0.3–1.2)
Lab: 0.5 mg/dL (ref 0.4–1.00)
Lab: 12 mg/dL — ABNORMAL HIGH (ref 7–25)
Lab: 15 U/L (ref 7–56)
Lab: 20 U/L (ref 7–40)
Lab: 29 MMOL/L (ref 21–30)
Lab: 3.3 g/dL — ABNORMAL LOW (ref 3.5–5.0)
Lab: 47 U/L — ABNORMAL LOW (ref 25–110)
Lab: 5.5 g/dL — ABNORMAL LOW (ref 6.0–8.0)
Lab: 7 10*3/uL — ABNORMAL LOW (ref 3–12)
Lab: 8.6 mg/dL (ref 8.5–10.6)
Lab: 81 mg/dL — ABNORMAL HIGH (ref 70–100)
Lab: >60 mL/min (ref >60)
Lab: >60 mL/min — ABNORMAL LOW (ref >60)

## 2018-02-03 LAB — IRON + BINDING CAPACITY + %SAT+ FERRITIN
Lab: 14 % — ABNORMAL LOW (ref 28–42)
Lab: 176 ng/mL (ref 10–200)
Lab: 237 ug/dL — ABNORMAL LOW (ref 270–380)
Lab: 34 ug/dL — ABNORMAL LOW (ref 50–160)

## 2018-02-03 LAB — CBC AND DIFF
Lab: 0 10*3/uL (ref 0–0.20)
Lab: 0.1 10*3/uL (ref 0–0.45)

## 2018-02-03 MED ORDER — PACLITAXEL IVPB
80 mg/m2 | Freq: Once | INTRAVENOUS | 0 refills | Status: CP
Start: 2018-02-03 — End: ?
  Administered 2018-02-03 (×2): 129.6 mg via INTRAVENOUS

## 2018-02-03 MED ORDER — HEPARIN, PORCINE (PF) 100 UNIT/ML IV SYRG
500 [IU] | Freq: Once | 0 refills | Status: CP
Start: 2018-02-03 — End: ?

## 2018-02-03 MED ORDER — FAMOTIDINE (PF) 20 MG/2 ML IV SOLN
20 mg | Freq: Once | INTRAVENOUS | 0 refills | Status: CP
Start: 2018-02-03 — End: ?
  Administered 2018-02-03: 17:00:00 20 mg via INTRAVENOUS

## 2018-02-03 MED ORDER — DIPHENHYDRAMINE HCL 50 MG/ML IJ SOLN
25 mg | Freq: Once | INTRAVENOUS | 0 refills | Status: CP
Start: 2018-02-03 — End: ?
  Administered 2018-02-03: 18:00:00 25 mg via INTRAVENOUS

## 2018-02-03 MED ORDER — DEXAMETHASONE IVPB
12 mg | Freq: Once | INTRAVENOUS | 0 refills | Status: CP
Start: 2018-02-03 — End: ?
  Administered 2018-02-03 (×2): 12 mg via INTRAVENOUS

## 2018-02-03 MED ORDER — ONDANSETRON HCL (PF) 4 MG/2 ML IJ SOLN
8 mg | Freq: Once | INTRAVENOUS | 0 refills | Status: CP
Start: 2018-02-03 — End: ?
  Administered 2018-02-03: 17:00:00 8 mg via INTRAVENOUS

## 2018-02-08 ENCOUNTER — Encounter: Admit: 2018-02-08 | Discharge: 2018-02-08 | Payer: MEDICARE

## 2018-02-10 ENCOUNTER — Encounter: Admit: 2018-02-10 | Discharge: 2018-02-10 | Payer: Commercial Managed Care - PPO

## 2018-02-14 ENCOUNTER — Encounter: Admit: 2018-02-14 | Discharge: 2018-02-14 | Payer: MEDICARE

## 2018-02-17 ENCOUNTER — Encounter: Admit: 2018-02-17 | Discharge: 2018-02-17 | Payer: Commercial Managed Care - PPO

## 2018-02-17 ENCOUNTER — Encounter: Admit: 2018-02-17 | Discharge: 2018-02-18 | Payer: MEDICARE

## 2018-02-17 ENCOUNTER — Encounter: Admit: 2018-02-17 | Discharge: 2018-02-17 | Payer: MEDICARE

## 2018-02-17 DIAGNOSIS — M109 Gout, unspecified: ICD-10-CM

## 2018-02-17 DIAGNOSIS — J45909 Unspecified asthma, uncomplicated: ICD-10-CM

## 2018-02-17 DIAGNOSIS — M1990 Unspecified osteoarthritis, unspecified site: Principal | ICD-10-CM

## 2018-02-17 DIAGNOSIS — Z9011 Acquired absence of right breast and nipple: ICD-10-CM

## 2018-02-17 DIAGNOSIS — H9190 Unspecified hearing loss, unspecified ear: ICD-10-CM

## 2018-02-17 DIAGNOSIS — G629 Polyneuropathy, unspecified: ICD-10-CM

## 2018-02-17 DIAGNOSIS — G4730 Sleep apnea, unspecified: ICD-10-CM

## 2018-02-17 DIAGNOSIS — K929 Disease of digestive system, unspecified: ICD-10-CM

## 2018-02-17 DIAGNOSIS — Z5111 Encounter for antineoplastic chemotherapy: ICD-10-CM

## 2018-02-17 DIAGNOSIS — Z170 Estrogen receptor positive status [ER+]: ICD-10-CM

## 2018-02-17 DIAGNOSIS — Z87898 Personal history of other specified conditions: ICD-10-CM

## 2018-02-17 DIAGNOSIS — D509 Iron deficiency anemia, unspecified: ICD-10-CM

## 2018-02-17 DIAGNOSIS — M549 Dorsalgia, unspecified: ICD-10-CM

## 2018-02-17 DIAGNOSIS — D0511 Intraductal carcinoma in situ of right breast: ICD-10-CM

## 2018-02-17 DIAGNOSIS — C50411 Malignant neoplasm of upper-outer quadrant of right female breast: Principal | ICD-10-CM

## 2018-02-17 DIAGNOSIS — M797 Fibromyalgia: ICD-10-CM

## 2018-02-17 LAB — COMPREHENSIVE METABOLIC PANEL
Lab: 0.4 mg/dL (ref 0.3–1.2)
Lab: 0.5 mg/dL (ref 0.4–1.00)
Lab: 103 MMOL/L — ABNORMAL LOW (ref 98–110)
Lab: 11 U/L (ref 7–56)
Lab: 13 mg/dL (ref 7–25)
Lab: 139 MMOL/L — ABNORMAL LOW (ref 137–147)
Lab: 16 U/L — ABNORMAL HIGH (ref 7–40)
Lab: 29 MMOL/L (ref 21–30)
Lab: 3.3 g/dL — ABNORMAL LOW (ref 3.5–5.0)
Lab: 3.6 MMOL/L — ABNORMAL LOW (ref 3.5–5.1)
Lab: 5.6 g/dL — ABNORMAL LOW (ref 6.0–8.0)
Lab: 59 U/L — ABNORMAL LOW (ref 25–110)
Lab: 7 10*3/uL (ref 3–12)
Lab: 79 mg/dL (ref 70–100)
Lab: 8.5 mg/dL (ref 8.5–10.6)
Lab: >60 mL/min (ref >60)
Lab: >60 mL/min — ABNORMAL LOW (ref >60)

## 2018-02-17 LAB — CBC AND DIFF
Lab: 0.1 10*3/uL (ref 0–0.20)
Lab: 0.1 10*3/uL (ref 0–0.45)
Lab: 3.6 10*3/uL — ABNORMAL LOW (ref 4.5–11.0)

## 2018-02-17 MED ORDER — FAMOTIDINE (PF) 20 MG/2 ML IV SOLN
20 mg | Freq: Once | INTRAVENOUS | 0 refills | Status: CN
Start: 2018-02-17 — End: ?

## 2018-02-17 MED ORDER — PACLITAXEL IVPB
64 mg/m2 | Freq: Once | INTRAVENOUS | 0 refills | Status: CN
Start: 2018-02-17 — End: ?

## 2018-02-17 MED ORDER — DEXAMETHASONE IVPB
12 mg | Freq: Once | INTRAVENOUS | 0 refills | Status: CP
Start: 2018-02-17 — End: ?
  Administered 2018-02-17 (×2): 12 mg via INTRAVENOUS

## 2018-02-17 MED ORDER — DEXAMETHASONE IVPB
12 mg | Freq: Once | INTRAVENOUS | 0 refills | Status: CN
Start: 2018-02-17 — End: ?

## 2018-02-17 MED ORDER — PACLITAXEL IVPB
64 mg/m2 | Freq: Once | INTRAVENOUS | 0 refills | Status: CP
Start: 2018-02-17 — End: ?
  Administered 2018-02-17 (×2): 103.68 mg via INTRAVENOUS

## 2018-02-17 MED ORDER — HEPARIN, PORCINE (PF) 100 UNIT/ML IV SYRG
500 [IU] | Freq: Once | 0 refills | Status: CP
Start: 2018-02-17 — End: ?

## 2018-02-17 MED ORDER — ONDANSETRON HCL (PF) 4 MG/2 ML IJ SOLN
8 mg | Freq: Once | INTRAVENOUS | 0 refills | Status: CP
Start: 2018-02-17 — End: ?
  Administered 2018-02-17: 21:00:00 8 mg via INTRAVENOUS

## 2018-02-17 MED ORDER — FAMOTIDINE (PF) 20 MG/2 ML IV SOLN
20 mg | Freq: Once | INTRAVENOUS | 0 refills | Status: CP
Start: 2018-02-17 — End: ?
  Administered 2018-02-17: 21:00:00 20 mg via INTRAVENOUS

## 2018-02-17 MED ORDER — DIPHENHYDRAMINE HCL 50 MG/ML IJ SOLN
25 mg | Freq: Once | INTRAVENOUS | 0 refills | Status: CP
Start: 2018-02-17 — End: ?
  Administered 2018-02-17: 21:00:00 25 mg via INTRAVENOUS

## 2018-02-17 MED ORDER — DIPHENHYDRAMINE HCL 50 MG/ML IJ SOLN
25 mg | Freq: Once | INTRAVENOUS | 0 refills | Status: CN
Start: 2018-02-17 — End: ?

## 2018-02-18 ENCOUNTER — Encounter: Admit: 2018-02-18 | Discharge: 2018-02-18 | Payer: MEDICARE

## 2018-02-18 DIAGNOSIS — H9190 Unspecified hearing loss, unspecified ear: ICD-10-CM

## 2018-02-18 DIAGNOSIS — M1990 Unspecified osteoarthritis, unspecified site: Principal | ICD-10-CM

## 2018-02-18 DIAGNOSIS — M109 Gout, unspecified: ICD-10-CM

## 2018-02-18 DIAGNOSIS — J45909 Unspecified asthma, uncomplicated: ICD-10-CM

## 2018-02-18 DIAGNOSIS — K929 Disease of digestive system, unspecified: ICD-10-CM

## 2018-02-18 DIAGNOSIS — G4730 Sleep apnea, unspecified: ICD-10-CM

## 2018-02-18 DIAGNOSIS — M797 Fibromyalgia: ICD-10-CM

## 2018-02-18 DIAGNOSIS — M549 Dorsalgia, unspecified: ICD-10-CM

## 2018-02-18 DIAGNOSIS — Z87898 Personal history of other specified conditions: ICD-10-CM

## 2018-02-21 NOTE — Patient Instructions
Thank you for coming to see us today.   Please call our office or send a message through MyChart if you have any questions or concerns.          Dr. Anne O'Dea and Michelle Prager, APRN  [phone] (Nurse Edgerrin Correia/Michele Kuester)  [phone] (fax)    [facility] [facility]  [address]  [place], [place] [address]    [facility]  [address]  [place],  [address]

## 2018-02-21 NOTE — Progress Notes
Skin: Negative for rash.   Neurological: Positive for numbness. Negative for dizziness, weakness, light-headedness and headaches.   Hematological: Negative for adenopathy. Does not bruise/bleed easily.   Psychiatric/Behavioral: Negative for sleep disturbance. The patient is not nervous/anxious.      Medical History:   Diagnosis Date   ??? Arthritis    ??? Asthma     as a child   ??? Back pain    ??? Fibromyalgia    ??? Gastrointestinal disorder     GERD   ??? Gout    ??? Hearing reduced    ??? History of palpitations    ??? Sleep apnea     wears CPAP machine     Surgical History:   Procedure Laterality Date   ??? TONSILLECTOMY  1964   ??? BILATERAL SALPINGO-OOPHORECTOMY  1982   ??? HX HYSTERECTOMY  1992   ??? HX APPENDECTOMY  1992   ??? HX CHOLECYSTECTOMY  2014   ??? HX CATARACT REMOVAL Bilateral 2014   ??? HX ACL RECONSTRUCTION Left 2015   ??? FOOT SURGERY Left 2017   ??? 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 RIGHT SKIN SPARING MASTECTOMY Right 08/15/2017    Performed by Ruthy Dick, DO at [facility] OR/PERIOP   ??? IDENTIFICATION SENTINEL LYMPH NODE Right 08/15/2017    Performed by Ruthy Dick, DO at [facility] OR/PERIOP   ??? INJECTION RADIOACTIVE TRACER FOR SENTINEL NODE IDENTIFICATION Right 08/15/2017    Performed by Ruthy Dick, DO at [facility] OR/PERIOP   ??? SENTINEL LYMPH NODE BIOPSY Right 08/15/2017    Performed by Ruthy Dick, DO at [facility] OR/PERIOP   ??? RECONSTRUCTION BREAST WITH TISSUE EXPANDER AND SUBSEQUENT EXPANSION - IMMEDIATE/ DELAYED Right 08/15/2017    Performed by Marlinda Mike, MD at [facility] OR/PERIOP   ??? IMPLANTATION BIOLOGIC IMPLANT FOR SOFT TISSUE REINFORCEMENT Right 08/15/2017    Performed by Marlinda Mike, MD at [facility] OR/PERIOP   ??? PORT PLACEMENT Left 09/20/2017    Performed by Geralyn Flash, MD at [facility] OR/PERIOP   ??? RIGHT TISSUE EXPANDER EXCHANGE Right 10/10/2017    Performed by Marlinda Mike, MD at [facility] OR/Periop   ??? WRIST SURGERY Left 2000's     Family History   Problem Relation Age of Onset   ??? Diabetes Mother    ??? Arthritis-rheumatoid Mother    ??? Depression Mother Appearance: Normal appearance. She is well-developed and normal weight.   HENT:      Head: Normocephalic and atraumatic.      Mouth/Throat:      Mouth: No oral lesions.      Pharynx: Oropharynx is clear. No oropharyngeal exudate.   Eyes:      General: No scleral icterus.     Conjunctiva/sclera: Conjunctivae normal.      Pupils: Pupils are equal, round, and reactive to light.   Neck:      Musculoskeletal: Normal range of motion and neck supple.   Cardiovascular:      Rate and Rhythm: Normal rate and regular rhythm.      Heart sounds: Normal heart sounds. No murmur. No friction rub. No gallop.    Pulmonary:      Effort: Pulmonary effort is normal.      Breath sounds: Normal breath sounds. No wheezing, rhonchi or rales.   Chest:       Abdominal:      General: Bowel sounds are normal. There is no distension.      Palpations: Abdomen is soft. There is no  mass.      Tenderness: There is no abdominal tenderness. There is no guarding.   Musculoskeletal: Normal range of motion.   Lymphadenopathy:      Cervical: No cervical adenopathy.      Upper Body:      Right upper body: No supraclavicular adenopathy.      Left upper body: No supraclavicular adenopathy.   Skin:     General: Skin is warm and dry.      Findings: No rash.   Neurological:      Mental Status: She is alert and oriented to person, place, and time.      Cranial Nerves: No cranial nerve deficit.      Sensory: No sensory deficit.      Coordination: Coordination normal.   Psychiatric:         Behavior: Behavior normal.         Thought Content: Thought content normal.         Judgment: Judgment normal.          ECHO 09/20/17 LVEF=67%          Assessment and Plan:  The patient is a 68 y.o.  female with the following:    1.  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   2.  S/p right mastectomy, SLNB with Dr. Loreta Ave and TE with Dr. Fran Lowes on

## 2018-02-24 ENCOUNTER — Encounter: Admit: 2018-02-24 | Discharge: 2018-02-24 | Payer: Commercial Managed Care - PPO

## 2018-02-24 ENCOUNTER — Encounter: Admit: 2018-02-24 | Discharge: 2018-02-24 | Payer: MEDICARE

## 2018-02-24 DIAGNOSIS — G629 Polyneuropathy, unspecified: ICD-10-CM

## 2018-02-24 DIAGNOSIS — Z9011 Acquired absence of right breast and nipple: ICD-10-CM

## 2018-02-24 DIAGNOSIS — Z87898 Personal history of other specified conditions: ICD-10-CM

## 2018-02-24 DIAGNOSIS — Z5111 Encounter for antineoplastic chemotherapy: ICD-10-CM

## 2018-02-24 DIAGNOSIS — M109 Gout, unspecified: ICD-10-CM

## 2018-02-24 DIAGNOSIS — H9190 Unspecified hearing loss, unspecified ear: ICD-10-CM

## 2018-02-24 DIAGNOSIS — C50411 Malignant neoplasm of upper-outer quadrant of right female breast: Principal | ICD-10-CM

## 2018-02-24 DIAGNOSIS — D0511 Intraductal carcinoma in situ of right breast: ICD-10-CM

## 2018-02-24 DIAGNOSIS — G4730 Sleep apnea, unspecified: ICD-10-CM

## 2018-02-24 DIAGNOSIS — K929 Disease of digestive system, unspecified: ICD-10-CM

## 2018-02-24 DIAGNOSIS — Z170 Estrogen receptor positive status [ER+]: ICD-10-CM

## 2018-02-24 DIAGNOSIS — D509 Iron deficiency anemia, unspecified: ICD-10-CM

## 2018-02-24 DIAGNOSIS — M797 Fibromyalgia: ICD-10-CM

## 2018-02-24 DIAGNOSIS — M549 Dorsalgia, unspecified: ICD-10-CM

## 2018-02-24 DIAGNOSIS — M1990 Unspecified osteoarthritis, unspecified site: Principal | ICD-10-CM

## 2018-02-24 DIAGNOSIS — J45909 Unspecified asthma, uncomplicated: ICD-10-CM

## 2018-02-24 LAB — COMPREHENSIVE METABOLIC PANEL
Lab: 0.5 mg/dL (ref 0.3–1.2)
Lab: 102 MMOL/L — ABNORMAL LOW (ref 98–110)
Lab: 11 mg/dL (ref 7–25)
Lab: 138 MMOL/L — ABNORMAL LOW (ref 137–147)
Lab: 17 U/L — ABNORMAL LOW (ref 7–40)
Lab: 3.4 g/dL — ABNORMAL LOW (ref 3.5–5.0)
Lab: 30 MMOL/L — ABNORMAL HIGH (ref 21–30)
Lab: 4.1 MMOL/L — ABNORMAL LOW (ref 3.5–5.1)
Lab: 63 U/L (ref 25–110)
Lab: 85 mg/dL (ref 70–100)
Lab: 9.2 mg/dL (ref 8.5–10.6)
Lab: >60 mL/min — ABNORMAL LOW (ref >60)

## 2018-02-24 LAB — CBC AND DIFF
Lab: 0 10*3/uL (ref 0–0.20)
Lab: 0.1 10*3/uL (ref 0–0.45)
Lab: 0.6 10*3/uL (ref 0–0.80)
Lab: 1 % (ref 0–2)
Lab: 2.5 K/UL (ref >60)
Lab: 221 K/UL — ABNORMAL LOW (ref 150–400)
Lab: 3 % (ref 0–5)
Lab: 3.6 10*3/uL — ABNORMAL LOW (ref 4.5–11.0)
Lab: 33 g/dL (ref 32.0–36.0)

## 2018-02-24 MED ORDER — PACLITAXEL IVPB
64 mg/m2 | Freq: Once | INTRAVENOUS | 0 refills | Status: CP
Start: 2018-02-24 — End: ?
  Administered 2018-02-24 (×2): 103.68 mg via INTRAVENOUS

## 2018-02-24 MED ORDER — ONDANSETRON HCL (PF) 4 MG/2 ML IJ SOLN
8 mg | Freq: Once | INTRAVENOUS | 0 refills | Status: CP
Start: 2018-02-24 — End: ?
  Administered 2018-02-24: 21:00:00 8 mg via INTRAVENOUS

## 2018-02-24 MED ORDER — DIPHENHYDRAMINE HCL 50 MG/ML IJ SOLN
25 mg | Freq: Once | INTRAVENOUS | 0 refills | Status: CP
Start: 2018-02-24 — End: ?
  Administered 2018-02-24: 21:00:00 25 mg via INTRAVENOUS

## 2018-02-24 MED ORDER — DEXAMETHASONE IVPB
12 mg | Freq: Once | INTRAVENOUS | 0 refills | Status: CN
Start: 2018-02-24 — End: ?

## 2018-02-24 MED ORDER — DIPHENHYDRAMINE HCL 50 MG/ML IJ SOLN
25 mg | Freq: Once | INTRAVENOUS | 0 refills | Status: CN
Start: 2018-02-24 — End: ?

## 2018-02-24 MED ORDER — FAMOTIDINE (PF) 20 MG/2 ML IV SOLN
20 mg | Freq: Once | INTRAVENOUS | 0 refills | Status: CP
Start: 2018-02-24 — End: ?
  Administered 2018-02-24: 21:00:00 20 mg via INTRAVENOUS

## 2018-02-24 MED ORDER — HEPARIN, PORCINE (PF) 100 UNIT/ML IV SYRG
500 [IU] | Freq: Once | 0 refills | Status: CP
Start: 2018-02-24 — End: ?

## 2018-02-24 MED ORDER — FAMOTIDINE (PF) 20 MG/2 ML IV SOLN
20 mg | Freq: Once | INTRAVENOUS | 0 refills | Status: CN
Start: 2018-02-24 — End: ?

## 2018-02-24 MED ORDER — PACLITAXEL IVPB
64 mg/m2 | Freq: Once | INTRAVENOUS | 0 refills | Status: CN
Start: 2018-02-24 — End: ?

## 2018-02-24 MED ORDER — DEXAMETHASONE IVPB
12 mg | Freq: Once | INTRAVENOUS | 0 refills | Status: CP
Start: 2018-02-24 — End: ?
  Administered 2018-02-24 (×2): 12 mg via INTRAVENOUS

## 2018-03-03 ENCOUNTER — Encounter: Admit: 2018-03-03 | Discharge: 2018-03-04 | Payer: Commercial Managed Care - PPO

## 2018-03-03 ENCOUNTER — Encounter: Admit: 2018-03-03 | Discharge: 2018-03-03 | Payer: MEDICARE

## 2018-03-03 ENCOUNTER — Encounter: Admit: 2018-03-03 | Discharge: 2018-03-03 | Payer: Commercial Managed Care - PPO

## 2018-03-03 DIAGNOSIS — C50411 Malignant neoplasm of upper-outer quadrant of right female breast: Principal | ICD-10-CM

## 2018-03-03 DIAGNOSIS — D0511 Intraductal carcinoma in situ of right breast: ICD-10-CM

## 2018-03-03 DIAGNOSIS — Z9011 Acquired absence of right breast and nipple: ICD-10-CM

## 2018-03-03 DIAGNOSIS — Z170 Estrogen receptor positive status [ER+]: ICD-10-CM

## 2018-03-03 LAB — COMPREHENSIVE METABOLIC PANEL
Lab: 0.4 mg/dL (ref 0.3–1.2)
Lab: 12 U/L (ref 7–56)
Lab: 18 U/L — ABNORMAL HIGH (ref 7–40)
Lab: 29 MMOL/L (ref 21–30)
Lab: 3.4 g/dL — ABNORMAL LOW (ref 3.5–5.0)
Lab: >60 mL/min (ref >60)
Lab: >60 mL/min — ABNORMAL LOW (ref >60)

## 2018-03-03 LAB — CBC AND DIFF
Lab: 0.1 10*3/uL (ref 0–0.45)
Lab: 16 % — ABNORMAL LOW (ref 24–44)

## 2018-03-03 MED ORDER — HEPARIN, PORCINE (PF) 100 UNIT/ML IV SYRG
500 [IU] | Freq: Once | 0 refills | Status: CP
Start: 2018-03-03 — End: ?

## 2018-03-03 MED ORDER — PACLITAXEL IVPB
64 mg/m2 | Freq: Once | INTRAVENOUS | 0 refills | Status: CP
Start: 2018-03-03 — End: ?
  Administered 2018-03-03 (×2): 103.68 mg via INTRAVENOUS

## 2018-03-03 MED ORDER — FAMOTIDINE (PF) 20 MG/2 ML IV SOLN
20 mg | Freq: Once | INTRAVENOUS | 0 refills | Status: CP
Start: 2018-03-03 — End: ?
  Administered 2018-03-03: 20:00:00 20 mg via INTRAVENOUS

## 2018-03-03 MED ORDER — ONDANSETRON HCL (PF) 4 MG/2 ML IJ SOLN
8 mg | Freq: Once | INTRAVENOUS | 0 refills | Status: CP
Start: 2018-03-03 — End: ?
  Administered 2018-03-03: 20:00:00 8 mg via INTRAVENOUS

## 2018-03-03 MED ORDER — DEXAMETHASONE IVPB
12 mg | Freq: Once | INTRAVENOUS | 0 refills | Status: CP
Start: 2018-03-03 — End: ?
  Administered 2018-03-03 (×2): 12 mg via INTRAVENOUS

## 2018-03-03 MED ORDER — DIPHENHYDRAMINE HCL 50 MG/ML IJ SOLN
25 mg | Freq: Once | INTRAVENOUS | 0 refills | Status: CP
Start: 2018-03-03 — End: ?
  Administered 2018-03-03: 21:00:00 25 mg via INTRAVENOUS

## 2018-03-06 ENCOUNTER — Encounter: Admit: 2018-03-06 | Discharge: 2018-03-06 | Payer: Commercial Managed Care - PPO

## 2018-03-06 ENCOUNTER — Encounter: Admit: 2018-03-06 | Discharge: 2018-03-06 | Payer: MEDICARE

## 2018-03-06 DIAGNOSIS — C50411 Malignant neoplasm of upper-outer quadrant of right female breast: Principal | ICD-10-CM

## 2018-03-07 NOTE — Patient Instructions
Thank you for coming to see us today.   Please call our office or send a message through MyChart if you have any questions or concerns.          Dr. Anne O'Dea and Michelle Prager, APRN  [phone] (Nurse Tara Gardner/Michele Kuester)  [phone] (fax)    [facility] [facility]  [address]  [place], [place] [address]    [facility]  [address]  [place],  [address]

## 2018-03-10 ENCOUNTER — Encounter: Admit: 2018-03-10 | Discharge: 2018-03-10 | Payer: MEDICARE

## 2018-03-10 ENCOUNTER — Ambulatory Visit: Admit: 2018-03-10 | Discharge: 2018-03-11 | Payer: Commercial Managed Care - PPO

## 2018-03-10 ENCOUNTER — Encounter: Admit: 2018-03-10 | Discharge: 2018-03-10 | Payer: Commercial Managed Care - PPO

## 2018-03-10 DIAGNOSIS — H9190 Unspecified hearing loss, unspecified ear: ICD-10-CM

## 2018-03-10 DIAGNOSIS — M109 Gout, unspecified: ICD-10-CM

## 2018-03-10 DIAGNOSIS — Z9884 Bariatric surgery status: ICD-10-CM

## 2018-03-10 DIAGNOSIS — D509 Iron deficiency anemia, unspecified: ICD-10-CM

## 2018-03-10 DIAGNOSIS — M1990 Unspecified osteoarthritis, unspecified site: Principal | ICD-10-CM

## 2018-03-10 DIAGNOSIS — C50411 Malignant neoplasm of upper-outer quadrant of right female breast: Principal | ICD-10-CM

## 2018-03-10 DIAGNOSIS — Z9011 Acquired absence of right breast and nipple: ICD-10-CM

## 2018-03-10 DIAGNOSIS — Z87898 Personal history of other specified conditions: ICD-10-CM

## 2018-03-10 DIAGNOSIS — D0511 Intraductal carcinoma in situ of right breast: ICD-10-CM

## 2018-03-10 DIAGNOSIS — M818 Other osteoporosis without current pathological fracture: ICD-10-CM

## 2018-03-10 DIAGNOSIS — Z170 Estrogen receptor positive status [ER+]: ICD-10-CM

## 2018-03-10 DIAGNOSIS — M797 Fibromyalgia: ICD-10-CM

## 2018-03-10 DIAGNOSIS — M549 Dorsalgia, unspecified: ICD-10-CM

## 2018-03-10 DIAGNOSIS — G629 Polyneuropathy, unspecified: ICD-10-CM

## 2018-03-10 DIAGNOSIS — K929 Disease of digestive system, unspecified: ICD-10-CM

## 2018-03-10 DIAGNOSIS — G4730 Sleep apnea, unspecified: ICD-10-CM

## 2018-03-10 DIAGNOSIS — J45909 Unspecified asthma, uncomplicated: ICD-10-CM

## 2018-03-10 MED ORDER — ANASTROZOLE 1 MG PO TAB
1 mg | ORAL_TABLET | Freq: Every day | ORAL | 3 refills | 33.00000 days | Status: AC
Start: 2018-03-10 — End: 2018-07-03

## 2018-03-10 NOTE — Progress Notes
dysuria, frequency and urgency.   Musculoskeletal: Positive for arthralgias (baseline, no concerns voiced today), myalgias (baseline) and neck pain (baseline). Negative for back pain (baseline), gait problem, joint swelling and neck stiffness (baseline, no concerns voiced today).   Skin: Negative for rash.   Neurological: Positive for numbness. Negative for dizziness, weakness, light-headedness and headaches.   Hematological: Negative for adenopathy. Does not bruise/bleed easily.   Psychiatric/Behavioral: Negative for sleep disturbance. The patient is not nervous/anxious.      Medical History:   Diagnosis Date   ??? Arthritis    ??? Asthma     as a child   ??? Back pain    ??? Fibromyalgia    ??? Gastrointestinal disorder     GERD   ??? Gout    ??? Hearing reduced    ??? History of palpitations    ??? Sleep apnea     wears CPAP machine     Surgical History:   Procedure Laterality Date   ??? TONSILLECTOMY  1964   ??? BILATERAL SALPINGO-OOPHORECTOMY  1982   ??? HX HYSTERECTOMY  1992   ??? HX APPENDECTOMY  1992   ??? HX CHOLECYSTECTOMY  2014   ??? HX CATARACT REMOVAL Bilateral 2014   ??? HX ACL RECONSTRUCTION Left 2015   ??? FOOT SURGERY Left 2017   ??? 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 RIGHT SKIN SPARING MASTECTOMY Right 08/15/2017    Performed by Ruthy Dick, DO at [facility] OR/PERIOP   ??? IDENTIFICATION SENTINEL LYMPH NODE Right 08/15/2017    Performed by Ruthy Dick, DO at [facility] OR/PERIOP   ??? INJECTION RADIOACTIVE TRACER FOR SENTINEL NODE IDENTIFICATION Right 08/15/2017    Performed by Ruthy Dick, DO at [facility] OR/PERIOP   ??? SENTINEL LYMPH NODE BIOPSY Right 08/15/2017    Performed by Ruthy Dick, DO at [facility] OR/PERIOP   ??? RECONSTRUCTION BREAST WITH TISSUE EXPANDER AND SUBSEQUENT EXPANSION - IMMEDIATE/ DELAYED Right 08/15/2017    Performed by Marlinda Mike, MD at [facility] OR/PERIOP   ??? IMPLANTATION BIOLOGIC IMPLANT FOR SOFT TISSUE REINFORCEMENT Right 08/15/2017    Performed by Marlinda Mike, MD at [facility] OR/PERIOP   ??? PORT PLACEMENT Left 09/20/2017 Pain Addressed:  N/A    Patient Evaluated for a Clinical Trial: No treatment clinical trial available for this patient.     [place] Cooperative Oncology Group performance status is 1, Restricted in physically strenuous activity but ambulatory and able to carry out work of a light or sedentary nature, e.g., light house work, office work.     Physical Exam  Vitals signs reviewed.   Constitutional:       General: She is not in acute distress.     Appearance: Normal appearance. She is well-developed and normal weight.   HENT:      Head: Normocephalic and atraumatic.      Mouth/Throat:      Mouth: No oral lesions.      Pharynx: No oropharyngeal exudate.   Eyes:      General: No scleral icterus.     Conjunctiva/sclera: Conjunctivae normal.      Pupils: Pupils are equal, round, and reactive to light.   Neck:      Musculoskeletal: Normal range of motion and neck supple.   Cardiovascular:      Rate and Rhythm: Normal rate and regular rhythm.      Heart sounds: Normal heart sounds. No murmur. No friction rub. No gallop.    Pulmonary:      Effort: Pulmonary  effort is normal.      Breath sounds: Normal breath sounds. No wheezing, rhonchi or rales.   Chest:       Abdominal:      General: Bowel sounds are normal. There is no distension.      Palpations: Abdomen is soft. There is no mass.      Tenderness: There is no abdominal tenderness. There is no guarding.   Musculoskeletal: Normal range of motion.   Lymphadenopathy:      Cervical: No cervical adenopathy.      Upper Body:      Right upper body: No supraclavicular adenopathy.      Left upper body: No supraclavicular adenopathy.   Skin:     General: Skin is warm and dry.      Findings: No rash.   Neurological:      Mental Status: She is alert and oriented to person, place, and time.      Cranial Nerves: No cranial nerve deficit.      Sensory: No sensory deficit.      Coordination: Coordination normal.   Psychiatric:         Behavior: Behavior normal. continue to monitor this periodically for the duration of her treatment.  We have provided her with written information regarding this medication for further review.    Continue F/u with Dr. Fran Lowes.     Calea has asked insightful questions and all questions were answered.     Fox was encouraged to call with any persistent symptoms, questions, or concerns.    RTC in 6 weeks for an end of treatment visit and to assess her tolerance of endocrine therapy.    Dellia Cloud, MD

## 2018-03-11 ENCOUNTER — Encounter: Admit: 2018-03-11 | Discharge: 2018-03-11 | Payer: Commercial Managed Care - PPO

## 2018-03-11 MED ORDER — DENOSUMAB 60 MG/ML SC SYRG
60 mg | Freq: Once | SUBCUTANEOUS | 0 refills | Status: CN
Start: 2018-03-11 — End: ?

## 2018-03-12 ENCOUNTER — Encounter: Admit: 2018-03-12 | Discharge: 2018-03-12 | Payer: Commercial Managed Care - PPO

## 2018-03-12 ENCOUNTER — Encounter: Admit: 2018-03-12 | Discharge: 2018-03-12 | Payer: MEDICARE

## 2018-03-19 ENCOUNTER — Encounter: Admit: 2018-03-19 | Discharge: 2018-03-19 | Payer: Commercial Managed Care - PPO

## 2018-03-19 ENCOUNTER — Encounter: Admit: 2018-03-19 | Discharge: 2018-03-19 | Payer: MEDICARE

## 2018-03-19 DIAGNOSIS — C50411 Malignant neoplasm of upper-outer quadrant of right female breast: Secondary | ICD-10-CM

## 2018-03-19 DIAGNOSIS — C50919 Malignant neoplasm of unspecified site of unspecified female breast: ICD-10-CM

## 2018-03-19 DIAGNOSIS — J45909 Unspecified asthma, uncomplicated: ICD-10-CM

## 2018-03-19 DIAGNOSIS — G4730 Sleep apnea, unspecified: ICD-10-CM

## 2018-03-19 DIAGNOSIS — H9190 Unspecified hearing loss, unspecified ear: ICD-10-CM

## 2018-03-19 DIAGNOSIS — K929 Disease of digestive system, unspecified: ICD-10-CM

## 2018-03-19 DIAGNOSIS — G629 Polyneuropathy, unspecified: ICD-10-CM

## 2018-03-19 DIAGNOSIS — M109 Gout, unspecified: ICD-10-CM

## 2018-03-19 DIAGNOSIS — M797 Fibromyalgia: ICD-10-CM

## 2018-03-19 DIAGNOSIS — Z87898 Personal history of other specified conditions: ICD-10-CM

## 2018-03-19 DIAGNOSIS — T451X5A Adverse effect of antineoplastic and immunosuppressive drugs, initial encounter: ICD-10-CM

## 2018-03-19 DIAGNOSIS — M1990 Unspecified osteoarthritis, unspecified site: Principal | ICD-10-CM

## 2018-03-19 DIAGNOSIS — Z170 Estrogen receptor positive status [ER+]: ICD-10-CM

## 2018-03-19 DIAGNOSIS — G620 Drug-induced polyneuropathy: Principal | ICD-10-CM

## 2018-03-19 DIAGNOSIS — M549 Dorsalgia, unspecified: ICD-10-CM

## 2018-03-19 MED ORDER — DULOXETINE 30 MG PO CPDR
30 mg | ORAL_CAPSULE | Freq: Every day | ORAL | 2 refills | 60.00000 days | Status: AC
Start: 2018-03-19 — End: 2018-07-09

## 2018-03-19 NOTE — Patient Instructions
Prescription topical and oral medications  Typical prescription treatments for neuropathic pain fall into two groups of medications.  While these medication groups started out for treatment of different diagnoses, we use them to also treat nerve pain.  The general groups include:  1. Anti-epileptic/anti-seizure medications: gabapentin (Neurontin), pregabalin (Lyrica), Topamax, carbamazepine (Tegretol)  2. Anti-depressants ??? serotonin-norepinephrine reuptake inhibitors and tricyclic antidepressants: duloxetine (Cymbalta), venlafaxine (Effexor), amitriptyline (Elavil), nortriptyline (Pamelor)    Topical compounded creams can use some of these oral medications in a topical medium to provide symptomatic relief of pain at the site of use.  Sometimes starting with the over-the-counter options is most cost effective, but if we get some relief (but not enough) the prescription options might be beneficial.      Complimentary treatment options  Therapy can offer help with a variety of issues including difficulty with balance, improved walking, strengthening, and desensitization techniques that help with the neuropathy symptoms  Integrative oncology uses evidence-based complimentary therapies, including mind and body practices, natural products and lifestyle modifications to help manage symptoms and adverse effects of cancer treatments.  Therapies can include:  ? Acupuncture  ? Mindfulness  ? Relaxation  ? Stress management    Additional physical therapy modalities can help with the desensitization of nerves as well as some of the gait and balance issues that may result from your neuropathy.      Multiple other anecdotal remedies can be discussed, but there is limited research in their effectiveness, side effects, and interactions with other medications/treatments.  Some of these include TENS units, CBD oils, essential oils, etc.      Dr. Bobbye Riggs  General Instructions:

## 2018-03-19 NOTE — Pre-Anesthesia Medication Instructions
YOUR MEDICATIONS    Current Medications    Medication Directions   acetaminophen (TYLENOL) 325 mg tablet Take two tablets by mouth every 6 hours. Take q6 hours scheduled for first 3 day, then PRN after   allopurinol (ZYLOPRIM) 100 mg tablet Take 100 mg by mouth daily. Take with food.   anastrozole (ARIMIDEX) 1 mg tablet Take one tablet by mouth daily. Indications: hormone receptor positive breast cancer   Cyanocobalamin (VITAMIN B-12) 500 mcg lozg Place 500 mcg under tongue daily.   diazePAM (VALIUM) 5 mg tablet Take one tablet by mouth every 6 hours as needed for Anxiety.   duloxetine DR (CYMBALTA) 30 mg capsule Take one capsule by mouth daily.   Famotidine-Ca Carb-Mag Hydrox (PEPCID COMPLETE) 10-800-165 mg chew Chew 1 tablet by mouth twice daily.   gabapentin (NEURONTIN) 300 mg capsule Take 300 mg by mouth twice daily.   lidocaine/prilocaine (EMLA) 2.5/2.5 % topical cream Apply to port site 30-45 minutes prior to lab appointment   loperamide (IMODIUM A-D) 2 mg capsule Take 2 capsules by mouth initially, followed by 1 capsule by mouth after each loose stool up to a maximum of 8 tablets in 24 hours.   LORazepam (ATIVAN) 0.5 mg tablet Take one tablet by mouth every 6 hours as needed for Nausea, Vomiting or Other.... Indications: anxious, difficulty sleeping   OLANZapine (ZYPREXA ZYDIS) 10 mg rapid dissolve tablet Dissolve one tablet by mouth at bedtime daily.   other medication Take 1 each by mouth daily. Bariatric Pal Multivitamin 1 daily  Bariatric Multivitamin + Minerals to include daily (minimum): Folic Acid (Folate) 400 mcg, Thiamine 12 mg, Vitamin A 5000 units, Vitamin E 15 IU, Vitamin K 90 mcg Copper 2 mg, and Zinc 11mg    pantoprazole DR (PROTONIX) 40 mg tablet Take 40 mg by mouth twice daily.   senna/docusate (SENOKOT-S) 8.6/50 mg tablet Take one tablet by mouth daily.  Patient taking differently: Take 1 tablet by mouth twice daily.   simvastatin (ZOCOR) 20 mg tablet Take 20 mg by mouth at bedtime daily.

## 2018-03-20 ENCOUNTER — Encounter: Admit: 2018-03-20 | Discharge: 2018-03-20 | Payer: MEDICARE

## 2018-03-21 ENCOUNTER — Encounter: Admit: 2018-03-21 | Discharge: 2018-03-21 | Payer: MEDICARE

## 2018-03-21 ENCOUNTER — Ambulatory Visit: Admit: 2018-03-21 | Discharge: 2018-03-22 | Payer: Commercial Managed Care - PPO

## 2018-03-21 DIAGNOSIS — H9190 Unspecified hearing loss, unspecified ear: ICD-10-CM

## 2018-03-21 DIAGNOSIS — M1990 Unspecified osteoarthritis, unspecified site: Principal | ICD-10-CM

## 2018-03-21 DIAGNOSIS — M797 Fibromyalgia: ICD-10-CM

## 2018-03-21 DIAGNOSIS — C50411 Malignant neoplasm of upper-outer quadrant of right female breast: Principal | ICD-10-CM

## 2018-03-21 DIAGNOSIS — G4730 Sleep apnea, unspecified: ICD-10-CM

## 2018-03-21 DIAGNOSIS — G629 Polyneuropathy, unspecified: ICD-10-CM

## 2018-03-21 DIAGNOSIS — Z87898 Personal history of other specified conditions: ICD-10-CM

## 2018-03-21 DIAGNOSIS — J45909 Unspecified asthma, uncomplicated: ICD-10-CM

## 2018-03-21 DIAGNOSIS — M549 Dorsalgia, unspecified: ICD-10-CM

## 2018-03-21 DIAGNOSIS — C50919 Malignant neoplasm of unspecified site of unspecified female breast: ICD-10-CM

## 2018-03-21 DIAGNOSIS — K929 Disease of digestive system, unspecified: ICD-10-CM

## 2018-03-21 DIAGNOSIS — M109 Gout, unspecified: ICD-10-CM

## 2018-03-25 ENCOUNTER — Encounter: Admit: 2018-03-25 | Discharge: 2018-03-25 | Payer: MEDICARE

## 2018-03-26 ENCOUNTER — Encounter: Admit: 2018-03-26 | Discharge: 2018-03-26 | Payer: MEDICARE

## 2018-04-09 ENCOUNTER — Encounter: Admit: 2018-04-09 | Discharge: 2018-04-09 | Payer: Commercial Managed Care - PPO

## 2018-04-09 NOTE — Telephone Encounter
I spoke with Hailey Stewart and let her know that at this time we are cancelling all nonemergent office visits and imaging due to COVID 19. She has no concerns. We will contact her to reschedule.    Andrew Au, PA-C

## 2018-04-21 ENCOUNTER — Encounter: Admit: 2018-04-21 | Discharge: 2018-04-21 | Payer: Commercial Managed Care - PPO

## 2018-04-21 ENCOUNTER — Encounter: Admit: 2018-04-21 | Discharge: 2018-04-21 | Payer: MEDICARE

## 2018-04-21 DIAGNOSIS — Z9011 Acquired absence of right breast and nipple: ICD-10-CM

## 2018-04-21 DIAGNOSIS — Z79811 Long term (current) use of aromatase inhibitors: ICD-10-CM

## 2018-04-21 DIAGNOSIS — C50411 Malignant neoplasm of upper-outer quadrant of right female breast: Principal | ICD-10-CM

## 2018-04-21 DIAGNOSIS — M810 Age-related osteoporosis without current pathological fracture: ICD-10-CM

## 2018-04-21 DIAGNOSIS — Z170 Estrogen receptor positive status [ER+]: ICD-10-CM

## 2018-04-25 ENCOUNTER — Encounter: Admit: 2018-04-25 | Discharge: 2018-04-25 | Payer: Commercial Managed Care - PPO

## 2018-05-01 ENCOUNTER — Encounter: Admit: 2018-05-01 | Discharge: 2018-05-01 | Payer: Commercial Managed Care - PPO

## 2018-05-13 ENCOUNTER — Encounter: Admit: 2018-05-13 | Discharge: 2018-05-13 | Payer: MEDICARE

## 2018-05-15 ENCOUNTER — Ambulatory Visit: Admit: 2018-05-15 | Discharge: 2018-05-16 | Payer: Commercial Managed Care - PPO

## 2018-05-15 ENCOUNTER — Encounter: Admit: 2018-05-15 | Discharge: 2018-05-15 | Payer: Commercial Managed Care - PPO

## 2018-05-15 ENCOUNTER — Ambulatory Visit: Admit: 2018-05-15 | Discharge: 2018-05-16 | Payer: MEDICARE

## 2018-05-15 ENCOUNTER — Encounter: Admit: 2018-05-15 | Discharge: 2018-05-15 | Payer: MEDICARE

## 2018-05-15 DIAGNOSIS — C50411 Malignant neoplasm of upper-outer quadrant of right female breast: Principal | ICD-10-CM

## 2018-05-15 DIAGNOSIS — H9190 Unspecified hearing loss, unspecified ear: ICD-10-CM

## 2018-05-15 DIAGNOSIS — G4730 Sleep apnea, unspecified: ICD-10-CM

## 2018-05-15 DIAGNOSIS — Z9189 Other specified personal risk factors, not elsewhere classified: ICD-10-CM

## 2018-05-15 DIAGNOSIS — Z9011 Acquired absence of right breast and nipple: ICD-10-CM

## 2018-05-15 DIAGNOSIS — R922 Inconclusive mammogram: ICD-10-CM

## 2018-05-15 DIAGNOSIS — K929 Disease of digestive system, unspecified: ICD-10-CM

## 2018-05-15 DIAGNOSIS — Z1231 Encounter for screening mammogram for malignant neoplasm of breast: ICD-10-CM

## 2018-05-15 DIAGNOSIS — C50919 Malignant neoplasm of unspecified site of unspecified female breast: ICD-10-CM

## 2018-05-15 DIAGNOSIS — Z170 Estrogen receptor positive status [ER+]: ICD-10-CM

## 2018-05-15 DIAGNOSIS — M1990 Unspecified osteoarthritis, unspecified site: Principal | ICD-10-CM

## 2018-05-15 DIAGNOSIS — J45909 Unspecified asthma, uncomplicated: ICD-10-CM

## 2018-05-15 DIAGNOSIS — Z87898 Personal history of other specified conditions: ICD-10-CM

## 2018-05-15 DIAGNOSIS — M109 Gout, unspecified: ICD-10-CM

## 2018-05-15 DIAGNOSIS — M549 Dorsalgia, unspecified: ICD-10-CM

## 2018-05-15 DIAGNOSIS — G629 Polyneuropathy, unspecified: ICD-10-CM

## 2018-05-15 DIAGNOSIS — M797 Fibromyalgia: ICD-10-CM

## 2018-05-15 MED ORDER — BIMATOPROST 0.03 % LASH DRPA
1 [drp] | Freq: Every day | TOPICAL | 3 refills | 30.00000 days | Status: AC
Start: 2018-05-15 — End: ?

## 2018-05-15 NOTE — Progress Notes
Name: Hailey Stewart          MRN: [id number]      DOB: 1950/04/02      AGE: 68 y.o.   DATE OF SERVICE: 05/15/2018                   Cancer Staging  Malignant neoplasm of upper-outer quadrant of right breast in female, estrogen receptor positive (HCC)  Staging form: Breast, AJCC 8th Edition  - Clinical stage from 07/18/2017: Stage IB (cT2, cN0, cM0, G2, ER+, PR+, HER2-) - Signed by Andrew Au, PA-C on 07/23/2017  - Pathologic stage from 08/15/2017: Stage IA (pT2(2), pN0(sn), cM0, G2, ER+, PR+, HER2-) - Signed by Andrew Au, PA-C on 08/23/2017      DIAGNOSIS:  Right grade 2 IDC (ER94.7%, PR20.2%, HER2 0, Ki-67 38.77%) at 9:30, dx 07/2017    History of Present Illness    Hailey Stewart returns to the clinic for routine 8 month follow up.    HISTORY:  Hailey Stewart is a caucasian female who presented to the [facility] on 07/31/2017 at age 79 for evaluation of right breast cancer. She initially saw Andrew Au, PA-C at 07/18/2017 at age 89 for evaluation of right breast mass prior to biopsy. A suspicious mass was found in the right breast on the screening/tomo bilateral mammogram on 07/04/2017. A right breast ultrasound was performed on 07/05/2017 confirming a right breast mass. Hailey Stewart had recent gastric bypass surgery and after losing 40 lbs patient palpated a right breast lump. She denies breast pain, skin changes, or nipple discharge to either breast. No prior history of any breast biopsies or breast surgeries. Hailey Stewart had targeted breast imaging at [facility] showing the mass to measure 3.3 cm with additional pleomorphic calcifications surrounding the mass measuring up to 6.1 cm AP. There was an area of calcifications in the right breast upper outer quadrant 2.4 cm anterior to the mass and management of these calcifications would be based on the biopsy of the mass. Right breast sono-guided biopsy 07/18/17 ([place]) revealed grade 2 invasive ductal carcinoma. Hailey Stewart underwent right skin sparing mastectomy/SLNB/TE on 08/15/17.    PATHOLOGY:  Tumor:  4.5 x 3.2 x 2.8 cm and 0.9 x 0.7 x 0.7 cm (separated by 2.1 cm of benign breast tissue)  Margins Free From Tumor:  Yes  ER:  positive   PR:  positive    Her 2:  negative  Grade:  2  Lymph Nodes:  0/3  LVSI:  yes  Extranodal extension:  no      BREAST IMAGING:  Mammogram:    -- Bilateral screening mammogram 07/04/17 [facility]) revealed extremely dense breast tissue. There was a dense mass in the patient's right breast upper outer quadrant. This was only well seen on CC view and was vague on MLO view. I would recommend bilateral breast ultrasound for further evaluation. There were multiple calcifications in this area. There were no suspicious masses or calcifications in the left breast.  -- Right diagnostic mammogram 07/18/17 ([place]) revealed heterogeneously dense breast tissue. There was an irregular high density mass with associated pleomorphic calcifications and spiculated margins in the outer right breast at mid to posterior depth measuring up to 3.4 cm on mammogram with the extent of associated calcifications measuring 6.1 cm in anterior-posterior dimension. The mass measurement did not include associated spiculations. Additional suspicious calcifications were present in the upper outer right breast located 2.4 cm anterior to the suspicious mass and  calcifications.    Ultrasound:    -- Right breast ultrasound 07/05/17 [facility]) revealed a small hypoechoic irregularly but well marginated mass at 9:00, 6 cm FTN. The mass was hypoechoic and solid. The mass measured approximately 4.1 x 2.8 x 3 cm. Differential considerations include primary malignancy versus metastatic lesion. The mass demonstrated decreased vascularity with color Doppler interrogation. A small right axillary lymph node measured 5 x 33 mm. BI-RADS 4.    -- Targeted right breast ultrasound 07/18/17 ([place]) revealed an irregular Constitutional: Negative for fever, chills, appetite change and fatigue.   HENT: Negative for hearing loss, congestion, rhinorrhea and tinnitus.    Eyes: Negative for pain, discharge and itching.   Respiratory: Negative for cough, chest tightness and shortness of breath.    Cardiovascular: Negative for chest pain and palpitations.   Gastrointestinal: Negative for abdominal distention, pain, nausea, vomiting, and diarrhea.   Genitourinary: Negative for frequency, vaginal bleeding, difficulty urinating and pelvic pain.   Musculoskeletal: Negative for myalgias, back pain, joint swelling and arthralgias.   Skin: Negative for rash.   Neurological: Negative for dizziness, weakness, light-headedness and headaches.   Hematological: Does not bruise/bleed easily.   Psychiatric/Behavioral: Negative for disturbed wake/sleep cycle. The patient is not nervous/anxious.    Allergies   Allergen Reactions   ??? Garamycin [Gentamicin] ANAPHYLAXIS       Medical History:   Diagnosis Date   ??? Arthritis    ??? Asthma     as a child   ??? Back pain    ??? Breast cancer (HCC)    ??? Fibromyalgia    ??? Gastrointestinal disorder     GERD   ??? Gout    ??? Hearing reduced    ??? History of palpitations    ??? Neuropathy    ??? Sleep apnea     wears CPAP machine     Surgical History:   Procedure Laterality Date   ??? TONSILLECTOMY  1964   ??? BILATERAL SALPINGO-OOPHORECTOMY  1982   ??? HX HYSTERECTOMY  1992   ??? HX APPENDECTOMY  1992   ??? HX CHOLECYSTECTOMY  2014   ??? HX CATARACT REMOVAL Bilateral 2014   ??? HX ACL RECONSTRUCTION Left 2015   ??? FOOT SURGERY Left 2017   ??? 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 RIGHT SKIN SPARING MASTECTOMY Right 08/15/2017    Performed by Ruthy Dick, DO at [facility] OR   ??? IDENTIFICATION SENTINEL LYMPH NODE Right 08/15/2017    Performed by Ruthy Dick, DO at [facility] OR   ??? INJECTION RADIOACTIVE TRACER FOR SENTINEL NODE IDENTIFICATION Right 08/15/2017    Performed by Ruthy Dick, DO at [facility] OR   ??? SENTINEL LYMPH NODE BIOPSY Right 08/15/2017 Performed by Ruthy Dick, DO at [facility] OR   ??? RECONSTRUCTION BREAST WITH TISSUE EXPANDER AND SUBSEQUENT EXPANSION - IMMEDIATE/ DELAYED Right 08/15/2017    Performed by Marlinda Mike, MD at [facility] OR   ??? IMPLANTATION BIOLOGIC IMPLANT FOR SOFT TISSUE REINFORCEMENT Right 08/15/2017    Performed by Marlinda Mike, MD at [facility] OR   ??? PORT PLACEMENT Left 09/20/2017    Performed by Geralyn Flash, MD at [facility] OR   ??? RIGHT TISSUE EXPANDER EXCHANGE Right 10/10/2017    Performed by Marlinda Mike, MD at [facility] OR   ??? WRIST SURGERY Left 2000's     Family History   Problem Relation Age of Onset   ??? Diabetes Mother    ??? Arthritis-rheumatoid Mother    ???  Depression Mother    ??? Arthritis-rheumatoid Father    ??? Diabetes Maternal Grandmother    ??? Heart Disease Maternal Grandmother      Social History     Socioeconomic History   ??? Marital status: Married     Spouse name: Not on file   ??? Number of children: Not on file   ??? Years of education: Not on file   ??? Highest education level: Not on file   Occupational History   ??? Not on file   Tobacco Use   ??? Smoking status: Never Smoker   ??? Smokeless tobacco: Never Used   Substance and Sexual Activity   ??? Alcohol use: Yes     Alcohol/week: 1.0 standard drinks     Types: 1 Glasses of wine per week     Frequency: Monthly or less     Binge frequency: Less than monthly     Comment: rare   ??? Drug use: Never   ??? Sexual activity: Not Currently   Other Topics Concern   ??? Not on file   Social History Narrative   ??? Not on file         Objective:         ??? acetaminophen (TYLENOL) 325 mg tablet Take two tablets by mouth every 6 hours. Take q6 hours scheduled for first 3 day, then PRN after   ??? allopurinol (ZYLOPRIM) 100 mg tablet Take 100 mg by mouth daily. Take with food.   ??? anastrozole (ARIMIDEX) 1 mg tablet Take one tablet by mouth daily. Indications: hormone receptor positive breast cancer   ??? ascorbic acid (vitamin C) (VITAMIN-C) 250 mg tablet Take 250 mg by mouth daily. SpO2: 100%   Weight: 50.3 kg (111 lb)   Height: 157.5 cm (62)   PainSc: Zero     Body mass index is 20.3 kg/m???.     Pain Score: Zero            Pain Addressed:  N/A    Patient Evaluated for a Clinical Trial: No treatment clinical trial available for this patient.     [place] Cooperative Oncology Group performance status is 0, Fully active, able to carry on all pre-disease performance without restriction.Marland Kitchen     Physical Exam  Vitals signs reviewed.   Chest:         RIGHT BREAST EXAM:  Breast:  S/p mastectomy/SLNB/implant. No palpable masses  Skin Erythema:  No  Attachment of Overlying Skin:  No  Peau d' orange:  No  Chest Wall Attachment:  No  Nipple Inversion:  No  Nipple Discharge: No    LEFT BREAST EXAM:  Breast: No palpable masses  Skin Erythema:  No  Attachment of Overlying Skin:  No  Peau d' orange:  No  Chest Wall Attachment: No  Nipple Inversion:  No  Nipple Discharge:  No    RIGHT NODAL BASIN EXAM:  Axillary:  negative  Infraclavicular:  negative  Supraclavicular:  negative    LEFT NODAL BASIN EXAM:  Axillary:  negative  Infraclavicular: negative  Supraclavicular:  negative    Constitutional: No acute distress.  HEENT:  Head: Normocephalic and atraumatic.  Eyes: No discharge. No scleral icterus.  Pulmonary/Chest: No respiratory distress.   Neurological: Alert and oriented to person, place and time. No cranial nerve deficit.  Skin: Warm and dry. No rash noted. No erythema. No pallor.  Psychiatric: Normal mood and affect. Behavior is normal. Judgement and thought content normal.  Assessment and Plan:  Right grade 2 IDC (ER94.7%, PR20.2%, HER2 0, Ki-67 38.77%) at 9:30, dx 07/2017 - NED 8 months    Hailey Stewart reports she is doing well. She has had significant weight loss (50 pounds) since her initial visit due to weight loss surgery. Her Oncotype ws 27 and adjuvant chemotherapy was recommended. She finished the [facility], but did not complete 12 cycles of taxol due to neuropathy. She is now on Arimidex with Dr. Biagio Borg and is tolerating without complaints. She asked for a prescription for Latisse because her eyelashes are not growing in. I provided one and we discussed that it should only be used on the upper eyelashes and it can cause eye irritation. If it does, she should discontinue. She has significant asymmetry between both breasts and I provided a prescription for bras and prosthesis. She was supposed to have reconstructive surgery to have the TE removed, but due to COVID 19, the surgery was cancelled. She is planning to retire from her job and will not have insurance after 07/08/18. I recommend she follow up with Dr. Tama Gander office to see about getting her surgery rescheduled. She asked if I could write a letter so that she can continue to work off hours. I think this is reasonable since she finished chemotherapy a few months ago. Hailey Stewart had a BIS today for arm lymphedema surveillance. She had a left diagnostic mammogram and ultrasound which showed no suspicious findings. I will plan to see Hailey Stewart in 1 year with left screening mammogram and ultrasound. She was given ample time to ask questions all of which were answered to her satisfaction. She was encouraged to call with any interval questions or concerns.    1. Continue follow up with Dr. Biagio Borg  2. Continue follow up with Dr. Fran Lowes  3. Prescription for bras and prosthesis  4. Prescription for latisse  5. BIS in 1 year  6. RTC in 1 year with left screening mammogram and ultrasound    Andrew Au, PA-C

## 2018-05-15 NOTE — Progress Notes
Bioimpedance Spectroscopy performed. Advised patient that Normal result will be sent within 24 hours by mail or via Mychart (preferred). The patient will be contacted via phone by the lymphedema nurse with any abnormal results.

## 2018-05-16 ENCOUNTER — Encounter: Admit: 2018-05-16 | Discharge: 2018-05-16 | Payer: Commercial Managed Care - PPO

## 2018-05-16 DIAGNOSIS — Z01818 Encounter for other preprocedural examination: Principal | ICD-10-CM

## 2018-05-19 ENCOUNTER — Ambulatory Visit: Admit: 2018-05-19 | Discharge: 2018-05-20 | Payer: Commercial Managed Care - PPO

## 2018-05-19 ENCOUNTER — Encounter: Admit: 2018-05-19 | Discharge: 2018-05-19 | Payer: MEDICARE

## 2018-05-19 DIAGNOSIS — G4730 Sleep apnea, unspecified: ICD-10-CM

## 2018-05-19 DIAGNOSIS — C50919 Malignant neoplasm of unspecified site of unspecified female breast: ICD-10-CM

## 2018-05-19 DIAGNOSIS — M797 Fibromyalgia: ICD-10-CM

## 2018-05-19 DIAGNOSIS — G629 Polyneuropathy, unspecified: ICD-10-CM

## 2018-05-19 DIAGNOSIS — K929 Disease of digestive system, unspecified: ICD-10-CM

## 2018-05-19 DIAGNOSIS — M1990 Unspecified osteoarthritis, unspecified site: Principal | ICD-10-CM

## 2018-05-19 DIAGNOSIS — J45909 Unspecified asthma, uncomplicated: ICD-10-CM

## 2018-05-19 DIAGNOSIS — M109 Gout, unspecified: ICD-10-CM

## 2018-05-19 DIAGNOSIS — Z87898 Personal history of other specified conditions: ICD-10-CM

## 2018-05-19 DIAGNOSIS — H9190 Unspecified hearing loss, unspecified ear: ICD-10-CM

## 2018-05-19 DIAGNOSIS — M549 Dorsalgia, unspecified: ICD-10-CM

## 2018-05-19 NOTE — Progress Notes
Subjective:             Hailey Stewart is a 68 y.o. female.    History of Present Illness  Cancer Staging  Malignant neoplasm of upper-outer quadrant of right breast in female, estrogen receptor positive (HCC)  Staging form: Breast, AJCC 8th Edition  - Clinical stage from 07/18/2017: Stage IB (cT2, cN0, cM0, G2, ER+, PR+, HER2-) - Signed by Andrew Au, PA-C on 07/23/2017      68 y.o. female with right breast cancer. Hx of R SSM.  Currently has 700/650.  No radiation.  She currently wears a 36C and would like to be the same.  Past medical / surgical history is notable for RNYGB 03/2017.  Patient has no history of miscarriages or clotting disorders.  She has indicated a preference for implant based reconstruction.  Here to preop for TE exchange.   Body mass index is 20.12 kg/m???.    Allergies   Allergen Reactions   ??? Garamycin [Gentamicin] ANAPHYLAXIS     Medical History:   Diagnosis Date   ??? Arthritis    ??? Asthma     as a child   ??? Back pain    ??? Breast cancer (HCC)    ??? Fibromyalgia    ??? Gastrointestinal disorder     GERD   ??? Gout    ??? Hearing reduced    ??? History of palpitations    ??? Neuropathy    ??? Sleep apnea     wears CPAP machine     Surgical History:   Procedure Laterality Date   ??? TONSILLECTOMY  1964   ??? BILATERAL SALPINGO-OOPHORECTOMY  1982   ??? HX HYSTERECTOMY  1992   ??? HX APPENDECTOMY  1992   ??? HX CHOLECYSTECTOMY  2014   ??? HX CATARACT REMOVAL Bilateral 2014   ??? HX ACL RECONSTRUCTION Left 2015   ??? FOOT SURGERY Left 2017   ??? 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 RIGHT SKIN SPARING MASTECTOMY Right 08/15/2017    Performed by Ruthy Dick, DO at [facility]   ??? IDENTIFICATION SENTINEL LYMPH NODE Right 08/15/2017    Performed by Ruthy Dick, DO at [facility] OR   ??? INJECTION RADIOACTIVE TRACER FOR SENTINEL NODE IDENTIFICATION Right 08/15/2017    Performed by Ruthy Dick, DO at [facility] OR   ??? SENTINEL LYMPH NODE BIOPSY Right 08/15/2017    Performed by Ruthy Dick, DO at [facility] OR ??? simvastatin (ZOCOR) 20 mg tablet Take 20 mg by mouth at bedtime daily.   ??? sucralfate (CARAFATE) 1 gram tablet Take one tablet by mouth three times daily. Take on an empty stomach.   ??? turmeric root extract 538 mg cap Take  by mouth daily.     Vitals:    05/19/18 1200   BP: 114/64   Pulse: 64   Weight: 49.9 kg (110 lb)   Height: 157.5 cm (62)     Body mass index is 20.12 kg/m???.     Physical Exam  Constitutional:       Appearance: Normal appearance.   Pulmonary:      Effort: Pulmonary effort is normal.   Chest:       Abdominal:      General: Abdomen is flat.   Skin:     General: Skin is warm.   Neurological:      Mental Status: She is alert.   Psychiatric:         Mood and Affect: Mood normal.  Assessment and Plan:  71F with R breast cancer, hx of SSM, ready for TE exchange.  L breast volume deficient as compared to R.      We then discussed implant based reconstruction.  Permanent implants would replace tissue expanders at an outpatient procedure.  This would also require surveillance and maintenance as these implants are not designed to last a lifetime.  Differences in fill material were discussed with the patient. Additional risks such as rupture, capsular contracture, hematoma, rotation, and anaplastic large cell lymphoma were discussed with the patient.    Difference between native and reconstructed breast is ~    Informed consent obtained for L breast TE exchange, R breast implant for symmetry, possible TE placement.

## 2018-05-20 DIAGNOSIS — N651 Disproportion of reconstructed breast: ICD-10-CM

## 2018-05-20 DIAGNOSIS — C50411 Malignant neoplasm of upper-outer quadrant of right female breast: Principal | ICD-10-CM

## 2018-05-20 DIAGNOSIS — Z170 Estrogen receptor positive status [ER+]: ICD-10-CM

## 2018-05-25 ENCOUNTER — Encounter: Admit: 2018-05-25 | Discharge: 2018-05-25 | Payer: MEDICARE

## 2018-05-25 ENCOUNTER — Encounter: Admit: 2018-05-25 | Discharge: 2018-05-25 | Payer: Commercial Managed Care - PPO

## 2018-05-25 DIAGNOSIS — Z1159 Encounter for screening for other viral diseases: ICD-10-CM

## 2018-05-25 DIAGNOSIS — Z01818 Encounter for other preprocedural examination: Principal | ICD-10-CM

## 2018-05-26 ENCOUNTER — Encounter: Admit: 2018-05-26 | Discharge: 2018-05-26 | Payer: Commercial Managed Care - PPO

## 2018-05-26 DIAGNOSIS — C50411 Malignant neoplasm of upper-outer quadrant of right female breast: Secondary | ICD-10-CM

## 2018-05-26 LAB — COVID-19 (SARS-COV-2) PCR

## 2018-05-27 ENCOUNTER — Ambulatory Visit: Admit: 2018-05-27 | Discharge: 2018-05-27 | Payer: MEDICARE

## 2018-05-27 ENCOUNTER — Encounter: Admit: 2018-05-27 | Discharge: 2018-05-27 | Payer: MEDICARE

## 2018-05-27 ENCOUNTER — Encounter: Admit: 2018-05-27 | Discharge: 2018-05-27 | Payer: Commercial Managed Care - PPO

## 2018-05-27 ENCOUNTER — Ambulatory Visit: Admit: 2018-05-26 | Discharge: 2018-05-26 | Payer: Commercial Managed Care - PPO

## 2018-05-27 ENCOUNTER — Ambulatory Visit: Admit: 2018-05-27 | Discharge: 2018-05-27 | Payer: Commercial Managed Care - PPO

## 2018-05-27 DIAGNOSIS — G629 Polyneuropathy, unspecified: ICD-10-CM

## 2018-05-27 DIAGNOSIS — Z853 Personal history of malignant neoplasm of breast: ICD-10-CM

## 2018-05-27 DIAGNOSIS — M797 Fibromyalgia: ICD-10-CM

## 2018-05-27 DIAGNOSIS — N651 Disproportion of reconstructed breast: Principal | ICD-10-CM

## 2018-05-27 DIAGNOSIS — Z87898 Personal history of other specified conditions: ICD-10-CM

## 2018-05-27 DIAGNOSIS — J45909 Unspecified asthma, uncomplicated: ICD-10-CM

## 2018-05-27 DIAGNOSIS — M549 Dorsalgia, unspecified: ICD-10-CM

## 2018-05-27 DIAGNOSIS — K929 Disease of digestive system, unspecified: ICD-10-CM

## 2018-05-27 DIAGNOSIS — M1990 Unspecified osteoarthritis, unspecified site: Principal | ICD-10-CM

## 2018-05-27 DIAGNOSIS — H9190 Unspecified hearing loss, unspecified ear: ICD-10-CM

## 2018-05-27 DIAGNOSIS — G4730 Sleep apnea, unspecified: ICD-10-CM

## 2018-05-27 DIAGNOSIS — C50919 Malignant neoplasm of unspecified site of unspecified female breast: ICD-10-CM

## 2018-05-27 DIAGNOSIS — M109 Gout, unspecified: ICD-10-CM

## 2018-05-27 MED ORDER — DOXYCYCLINE HYCLATE 100 MG PO TAB
100 mg | ORAL_TABLET | Freq: Two times a day (BID) | ORAL | 0 refills | 8.00000 days | Status: AC
Start: 2018-05-27 — End: ?

## 2018-05-27 MED ORDER — DEXTRAN 70-HYPROMELLOSE (PF) 0.1-0.3 % OP DPET
0 refills | Status: DC
Start: 2018-05-27 — End: 2018-05-27
  Administered 2018-05-27: 13:00:00 2 [drp] via OPHTHALMIC

## 2018-05-27 MED ORDER — LACTATED RINGERS IV SOLP
1000 mL | INTRAVENOUS | 0 refills | Status: DC
Start: 2018-05-27 — End: 2018-05-27
  Administered 2018-05-27: 13:00:00 1000 mL via INTRAVENOUS

## 2018-05-27 MED ORDER — SUGAMMADEX 100 MG/ML IV SOLN
INTRAVENOUS | 0 refills | Status: DC
Start: 2018-05-27 — End: 2018-05-27
  Administered 2018-05-27: 15:00:00 101 mg via INTRAVENOUS

## 2018-05-27 MED ORDER — ROCURONIUM 10 MG/ML IV SOLN
INTRAVENOUS | 0 refills | Status: DC
Start: 2018-05-27 — End: 2018-05-27
  Administered 2018-05-27: 13:00:00 60 mg via INTRAVENOUS

## 2018-05-27 MED ORDER — HYDROMORPHONE (PF) 2 MG/ML IJ SYRG
.5 mg | INTRAVENOUS | 0 refills | Status: DC | PRN
Start: 2018-05-27 — End: 2018-05-27

## 2018-05-27 MED ORDER — PHENYLEPHRINE IN 0.9% NACL(PF) 1 MG/10 ML (100 MCG/ML) IV SYRG
INTRAVENOUS | 0 refills | Status: DC
Start: 2018-05-27 — End: 2018-05-27
  Administered 2018-05-27 (×4): 100 ug via INTRAVENOUS
  Administered 2018-05-27: 14:00:00 200 ug via INTRAVENOUS
  Administered 2018-05-27: 14:00:00 50 ug via INTRAVENOUS

## 2018-05-27 MED ORDER — LIDOCAINE (PF) 10 MG/ML (1 %) IJ SOLN
.1-2 mL | INTRAMUSCULAR | 0 refills | Status: DC | PRN
Start: 2018-05-27 — End: 2018-05-27

## 2018-05-27 MED ORDER — ONDANSETRON HCL (PF) 4 MG/2 ML IJ SOLN
INTRAVENOUS | 0 refills | Status: DC
Start: 2018-05-27 — End: 2018-05-27
  Administered 2018-05-27: 15:00:00 4 mg via INTRAVENOUS

## 2018-05-27 MED ORDER — MIDAZOLAM 1 MG/ML IJ SOLN
INTRAVENOUS | 0 refills | Status: DC
Start: 2018-05-27 — End: 2018-05-27
  Administered 2018-05-27 (×2): 1 mg via INTRAVENOUS

## 2018-05-27 MED ORDER — LIDOCAINE (PF) 200 MG/10 ML (2 %) IJ SYRG
0 refills | Status: DC
Start: 2018-05-27 — End: 2018-05-27
  Administered 2018-05-27: 13:00:00 50 mg via INTRAVENOUS

## 2018-05-27 MED ORDER — METOCLOPRAMIDE HCL 5 MG/ML IJ SOLN
10 mg | Freq: Once | INTRAVENOUS | 0 refills | Status: DC | PRN
Start: 2018-05-27 — End: 2018-05-27

## 2018-05-27 MED ORDER — PROPOFOL INJ 10 MG/ML IV VIAL
0 refills | Status: DC
Start: 2018-05-27 — End: 2018-05-27
  Administered 2018-05-27: 13:00:00 100 mg via INTRAVENOUS

## 2018-05-27 MED ORDER — DEXAMETHASONE SODIUM PHOSPHATE 4 MG/ML IJ SOLN
INTRAVENOUS | 0 refills | Status: DC
Start: 2018-05-27 — End: 2018-05-27
  Administered 2018-05-27: 13:00:00 4 mg via INTRAVENOUS

## 2018-05-27 MED ORDER — HYDROMORPHONE (PF) 2 MG/ML IJ SYRG
0 refills | Status: DC
Start: 2018-05-27 — End: 2018-05-27
  Administered 2018-05-27: 13:00:00 0.5 mg via INTRAVENOUS
  Administered 2018-05-27: 14:00:00 .2 mg via INTRAVENOUS

## 2018-05-27 MED ORDER — CEFAZOLIN 1 GRAM IJ SOLR
0 refills | Status: DC
Start: 2018-05-27 — End: 2018-05-27
  Administered 2018-05-27: 14:00:00 2 g via INTRAVENOUS

## 2018-05-27 MED ORDER — OXYCODONE 5 MG PO TAB
5 mg | ORAL_TABLET | ORAL | 0 refills | 6.00000 days | Status: AC | PRN
Start: 2018-05-27 — End: 2018-08-04

## 2018-05-27 MED ORDER — EPHEDRINE SULFATE 50 MG/ML IJ SOLN
0 refills | Status: DC
Start: 2018-05-27 — End: 2018-05-27
  Administered 2018-05-27: 14:00:00 10 mg via INTRAVENOUS
  Administered 2018-05-27 (×2): 5 mg via INTRAVENOUS

## 2018-05-27 MED ORDER — HEPARIN, PORCINE (PF) 100 UNIT/ML IV SYRG
500 [IU] | Freq: Once | 0 refills | Status: CP
Start: 2018-05-27 — End: ?

## 2018-05-27 MED ORDER — FENTANYL CITRATE (PF) 50 MCG/ML IJ SOLN
50 ug | INTRAVENOUS | 0 refills | Status: DC | PRN
Start: 2018-05-27 — End: 2018-05-27

## 2018-05-27 MED ORDER — HALOPERIDOL LACTATE 5 MG/ML IJ SOLN
0 refills | Status: DC
Start: 2018-05-27 — End: 2018-05-27
  Administered 2018-05-27: 13:00:00 1 mg via INTRAVENOUS

## 2018-05-27 MED ORDER — BUPIVACAINE-EPINEPHRINE 0.25 %-1:200,000 IJ SOLN
0 refills | Status: DC
Start: 2018-05-27 — End: 2018-05-27
  Administered 2018-05-27: 15:00:00 16 mL via INTRAMUSCULAR

## 2018-05-27 MED ORDER — ACETAMINOPHEN 1,000 MG/100 ML (10 MG/ML) IV SOLN
0 refills | Status: DC
Start: 2018-05-27 — End: 2018-05-27
  Administered 2018-05-27: 15:00:00 1000 mg via INTRAVENOUS

## 2018-05-27 MED ORDER — DIPHENHYDRAMINE HCL 50 MG/ML IJ SOLN
25 mg | Freq: Once | INTRAVENOUS | 0 refills | Status: DC | PRN
Start: 2018-05-27 — End: 2018-05-27

## 2018-05-27 MED ORDER — OXYCODONE 5 MG PO TAB
5-10 mg | Freq: Once | ORAL | 0 refills | Status: DC | PRN
Start: 2018-05-27 — End: 2018-05-27

## 2018-05-27 NOTE — Anesthesia Post-Procedure Evaluation
Post-Anesthesia Evaluation    Name: Hailey Stewart      MRN: [id number]     DOB: 08-Oct-1950     Age: 68 y.o.     Sex: female   __________________________________________________________________________     Procedure Date: 05/27/2018  Procedure(s) (LRB):  REPLACEMENT TISSUE EXPANDER WITH PERMANENT PROSTHESIS (Right)  INSERTION BREAST PROSTHESIS FOLLOWING MASTOPEXY/ MASTECTOMY/ IN RECONSTRUCTION - IMMEDIATE (Left)  REVISION RECONSTRUCTED BREAST (Right)      Surgeon: Surgeon(s):  Marlinda Mike, MD  Nicky Pugh., MD    Post-Anesthesia Vitals  BP: 103/56 (05/19 1100)  Temp: 36.4 ???C (97.5 ???F) (05/19 1045)  Pulse: 79 (05/19 1045)  Respirations: 13 PER MINUTE (05/19 1045)  SpO2: 100 % (05/19 1045)   Vitals Value Taken Time   BP 103/56 05/27/2018 11:00 AM   Temp 36.4 ???C (97.5 ???F) 05/27/2018 10:45 AM   Pulse 79 05/27/2018 10:45 AM   Respirations 13 PER MINUTE 05/27/2018 10:45 AM   SpO2 100 % 05/27/2018 10:45 AM         Post Anesthesia Evaluation Note    Evaluation location: Pre/Post  Patient participation: recovered; patient participated in evaluation  Level of consciousness: alert    Pain score: 0  Pain management: adequate    Hydration: normovolemia  Temperature: 36.0???C - 38.4???C  Airway patency: adequate    Perioperative Events       Post-op nausea and vomiting: no PONV    Postoperative Status  Cardiovascular status: hemodynamically stable  Respiratory status: spontaneous ventilation  Follow-up needed: none        Perioperative Events  Perioperative Event: No  Emergency Case Activation: No

## 2018-05-27 NOTE — Operative Report(Direct Entry)
OPERATIVE REPORT    Name: Hailey Stewart is a 68 y.o. female     DOB: 07-Nov-1950             MRN#: [id number]    DATE OF OPERATION: 05/27/2018    Surgeon(s) and Role:     * Marlinda Mike, MD - Primary     * Grow, Penni Homans., MD - Resident - Assisting        Preoperative Diagnosis:    Malignant neoplasm of upper-outer quadrant of right breast in female, estrogen receptor positive (HCC) [C50.411, Z17.0]    Post-op Diagnosis      * Malignant neoplasm of upper-outer quadrant of right breast in female, estrogen receptor positive (HCC) [C50.411, Z17.0]     * Breast asymmetry between native breast and reconstructed breast [N65.1]    Procedure(s) (LRB):  REPLACEMENT TISSUE EXPANDER WITH PERMANENT PROSTHESIS (Right)  INSERTION BREAST PROSTHESIS FOLLOWING MASTOPEXY/ MASTECTOMY/ IN RECONSTRUCTION - IMMEDIATE (Left)  REVISION RECONSTRUCTED BREAST (Right)    Anesthesia Type: General    Indications for Procedure - 68 y.o. female with hx of R breast cancer, s/p SSM, TE placement.  To OR for R TE exchange, L implant placement for symmetry    Findings:  R pocket loose - capsulorraphy along lateral gutter and capsulotomy performed at superior aspect.  L breast implant placed in subfascial pocket.      Implants:  Implant Name Type Inv. Item Serial No. Manufacturer Lot No. LRB No. Used Action   EXPANDER TISSUE 14.6X12.6CM BREAST STYLE 9200 TEXTURED - [id number]  EXPANDER TISSUE 14.6X12.6CM BREAST STYLE 9200 TEXTURED [id number] [employer] [id number] Right 1 Explanted   Allergan Natrelle Smooth Round Breast Implant  [id number]  IMPLANT BREAST NATRELLE INSPIRA COHESIVE 345CC SUPERIOR [id number] [facility] [employer] [id number] Left 1 Implanted   Allergan Natrelle Smooth Round Breast Implant  [id number]    Allergan  Right  Implanted       Description and Findings of Operative Procedure: The patient was taken to the operating room. Anesthesia was started. Chest and breasts were prepped in the usual fashion. We started with the right breast. Incision was made through the previous mastectomy incision.  This was taken in a stairstep fashion to offset the suture lines of closure.  This was taken down to the level of the AlloDerm with the scalpel and electrocautery. Tissue expander was identified and loosened with finger sweep. Tissue expander was removed in toto. Capsulotomy was performed at the superior aspects of the patient's capsule with electrocautery.  Capsulorraphy was performed at the lateral pocket with 3-0 PDS in a running fashion.  Pocket was irrigated. Hemostasis was achieved. A silicone sizer was placed in the right breast pocket and was tailor tacked in position with staples.      Turning to the left breast, we marked an incision above the IMF.  Incision was made with scalpel and subfascial pocket was created with electrocautery.  Hemostasis was achieved and sizer was placed in pocket.  The patient was repositioned in the upright position. Both breasts were examined and inspected. The silicone sizer provided the most pleasing aesthetic look. We then repositioned the table back to 180 degrees.     The chest and breasts were re-prepped with ChloraPrep and draped with clean towels. Glove change occurred. The first implant was opened onto the back table and irrigated with orthopedic saline. Using a pair of clean [employer]'s, the implant was placed in the right breast pocket. Implant was verified in proper  position. AlloDerm was reapproximated using inturrupted 3-0 Monocryl. A malleable was used to protect inadvertent puncture to the implant.     This process was then repeated on the left  hand side with the 2nd implant. Proper position of the implant was verified. Skin incisions were then closed with 3-0 and 4-0 Monocryl. The skin was covered with Therabond and Tegaderm    The patient was weaned from anesthesia, transferred to a gurney, placed

## 2018-06-13 ENCOUNTER — Encounter: Admit: 2018-06-13 | Discharge: 2018-06-13

## 2018-06-13 DIAGNOSIS — C50411 Malignant neoplasm of upper-outer quadrant of right female breast: Principal | ICD-10-CM

## 2018-06-13 DIAGNOSIS — J45909 Unspecified asthma, uncomplicated: Secondary | ICD-10-CM

## 2018-06-13 DIAGNOSIS — M549 Dorsalgia, unspecified: Secondary | ICD-10-CM

## 2018-06-13 DIAGNOSIS — M109 Gout, unspecified: Secondary | ICD-10-CM

## 2018-06-13 DIAGNOSIS — G629 Polyneuropathy, unspecified: Secondary | ICD-10-CM

## 2018-06-13 DIAGNOSIS — N651 Disproportion of reconstructed breast: Secondary | ICD-10-CM

## 2018-06-13 DIAGNOSIS — C50919 Malignant neoplasm of unspecified site of unspecified female breast: Secondary | ICD-10-CM

## 2018-06-13 DIAGNOSIS — Z87898 Personal history of other specified conditions: Secondary | ICD-10-CM

## 2018-06-13 DIAGNOSIS — H9190 Unspecified hearing loss, unspecified ear: Secondary | ICD-10-CM

## 2018-06-13 DIAGNOSIS — M797 Fibromyalgia: Secondary | ICD-10-CM

## 2018-06-13 DIAGNOSIS — M1990 Unspecified osteoarthritis, unspecified site: Secondary | ICD-10-CM

## 2018-06-13 DIAGNOSIS — G4730 Sleep apnea, unspecified: Secondary | ICD-10-CM

## 2018-06-13 DIAGNOSIS — K929 Disease of digestive system, unspecified: Secondary | ICD-10-CM

## 2018-06-13 MED ORDER — BIMATOPROST 0.03 % LASH DRPA
1 [drp] | Freq: Every day | TOPICAL | 1 refills | 30.00000 days | Status: DC
Start: 2018-06-13 — End: 2018-09-01

## 2018-06-13 NOTE — Progress Notes
Date of Service: 06/13/2018    Subjective:             Rhylen Mayben is a 68 y.o. female.    History of Present Illness  33F POD#7 R TE exchange, L implant augmentation for symmetry.  Doing well.  Pain controlled.       Review of Systems   Constitutional: Negative.    HENT: Negative.    Eyes: Negative.    Respiratory: Negative.    Cardiovascular: Negative.    Gastrointestinal: Negative.    Endocrine: Negative.    Genitourinary: Negative.    Musculoskeletal: Negative.    Skin: Negative.    Allergic/Immunologic: Negative.    Neurological: Negative.    Hematological: Negative.    Psychiatric/Behavioral: Negative.          Objective:         ??? acetaminophen (TYLENOL) 325 mg tablet Take two tablets by mouth every 6 hours. Take q6 hours scheduled for first 3 day, then PRN after   ??? allopurinol (ZYLOPRIM) 100 mg tablet Take 100 mg by mouth daily. Take with food.   ??? anastrozole (ARIMIDEX) 1 mg tablet Take one tablet by mouth daily. Indications: hormone receptor positive breast cancer   ??? ascorbic acid (vitamin C) (VITAMIN-C) 250 mg tablet Take 250 mg by mouth daily.   ??? biotin 10,000 mcg TbDi Dissolve  by mouth daily.   ??? Cyanocobalamin (VITAMIN B-12) 500 mcg lozg Place 500 mcg under tongue daily.   ??? diazePAM (VALIUM) 5 mg tablet Take one tablet by mouth every 6 hours as needed for Anxiety.   ??? duloxetine DR (CYMBALTA) 30 mg capsule Take one capsule by mouth daily.   ??? Famotidine-Ca Carb-Mag Hydrox (PEPCID COMPLETE) 10-800-165 mg chew Chew 1 tablet by mouth twice daily.   ??? gabapentin (NEURONTIN) 300 mg capsule Take 300 mg by mouth twice daily.   ??? lidocaine/prilocaine (EMLA) 2.5/2.5 % topical cream Apply to port site 30-45 minutes prior to lab appointment   ??? loperamide (IMODIUM A-D) 2 mg capsule Take 2 capsules by mouth initially, followed by 1 capsule by mouth after each loose stool up to a maximum of 8 tablets in 24 hours.   ??? LORazepam (ATIVAN) 0.5 mg tablet Take one tablet by mouth every 6 hours as needed for Nausea, Vomiting or Other.... Indications: anxious, difficulty sleeping   ??? OLANZapine (ZYPREXA ZYDIS) 10 mg rapid dissolve tablet Dissolve one tablet by mouth at bedtime daily.   ??? other medication Take 1 each by mouth daily. Bariatric Pal Multivitamin 1 daily  Bariatric Multivitamin + Minerals to include daily (minimum): Folic Acid (Folate) 400 mcg, Thiamine 12 mg, Vitamin A 5000 units, Vitamin E 15 IU, Vitamin K 90 mcg Copper 2 mg, and Zinc 11mg    ??? oxyCODONE (ROXICODONE) 5 mg tablet Take one tablet by mouth every 4 hours as needed   ??? pantoprazole DR (PROTONIX) 40 mg tablet Take 40 mg by mouth twice daily.   ??? senna/docusate (SENOKOT-S) 8.6/50 mg tablet Take one tablet by mouth daily. (Patient taking differently: Take 1 tablet by mouth twice daily.)   ??? simvastatin (ZOCOR) 20 mg tablet Take 20 mg by mouth at bedtime daily.   ??? sucralfate (CARAFATE) 1 gram tablet Take one tablet by mouth three times daily. Take on an empty stomach.   ??? turmeric root extract 538 mg cap Take  by mouth daily.     Vitals:    06/13/18 0827   BP: 107/63   BP  Source: Arm, Left Upper   Patient Position: Sitting   Pulse: 72   Weight: 47.2 kg (104 lb)   Height: 157.5 cm (62)   PainSc: Zero     Body mass index is 19.02 kg/m???.     Physical Exam  Pulmonary:      Effort: Pulmonary effort is normal.   Chest:       Skin:     General: Skin is warm.   Neurological:      Mental Status: She is alert.   Psychiatric:         Mood and Affect: Mood normal.          Assessment and Plan:  63F s/p TE exchange, and R breast implant for symmetry.  Doing well.  Marked ptosis on both sides.  Patient feels that implants are not large enough  -Patient has device on 100lb frame.  Encouraged patient to review her appearance in clothes instead of focusing on cup size.    -Paitent has marked ptosis of L breast esp in face of previous MWL.  No revisions were done at the time of implant placement  -RTC 4 weeks -Will discuss additional revisions at that time.

## 2018-06-14 ENCOUNTER — Encounter: Admit: 2018-06-14 | Discharge: 2018-06-14

## 2018-06-14 ENCOUNTER — Ambulatory Visit: Admit: 2018-06-13 | Discharge: 2018-06-14

## 2018-06-14 DIAGNOSIS — Z170 Estrogen receptor positive status [ER+]: Secondary | ICD-10-CM

## 2018-07-03 ENCOUNTER — Encounter: Admit: 2018-07-03 | Discharge: 2018-07-03

## 2018-07-03 MED ORDER — ANASTROZOLE 1 MG PO TAB
1 mg | ORAL_TABLET | Freq: Every day | ORAL | 3 refills | 33.00000 days | Status: DC
Start: 2018-07-03 — End: 2019-07-07

## 2018-07-03 NOTE — Telephone Encounter
Pt called reporting she is retiring at end of month and has new drug coverage plan that she needs her Arimidex sent through. the new Medicare [facility] Rx Drug Plan. Number [phone] fax [phone]. Member ID: [id number] Plan: [id number] Rx BIN: [id number] Rx Grp: [id number] Rx PCN: Med DADV Cust Serv: [phone] [place]: [phone]. Pt to send front/back pictures of new Medicare Card through [place] reference for when calling [facility] to establish new Rx for Arimidex.

## 2018-07-03 NOTE — Telephone Encounter
Pt notified that script was sent to [place] [facility].

## 2018-07-03 NOTE — Telephone Encounter
Spoke with insurance and pt's Arimidex will be filled through [facility]   [address]   [address]   [place] [address]

## 2018-07-06 ENCOUNTER — Encounter: Admit: 2018-07-06 | Discharge: 2018-07-06

## 2018-07-09 MED ORDER — DULOXETINE 30 MG PO CPDR
30 mg | ORAL_CAPSULE | Freq: Every day | ORAL | 0 refills | 60.00000 days | Status: DC
Start: 2018-07-09 — End: 2018-10-28

## 2018-07-10 ENCOUNTER — Encounter: Admit: 2018-07-10 | Discharge: 2018-07-10

## 2018-07-10 NOTE — Telephone Encounter
Spoke with [facility] mail order to verify pt's script was received. [facility] not [facility]. Was informed that [facility] was [facility] to was the incorrect division. Also, they reported that the generic anastrozole was not approved by her insurance plan. Unable to get reason until ran. [facility]ist [place]. He ran test claim and was rejected and no reason was given. Pharmacist to still submit prescription for fill.     Told by pharmacist that the pt will need to call customer number [phone] to discuss reason why ALL new medications are being rejected. There were four other new scripts sent in that were getting rejections on per [facility], PharmD.     Discussed with patient regarding prescription coverage denial for ALL of her prescriptions and needing to call customer service. Patient thanked [place] for calling [facility] and discovering this situation. She is to call customer service today.

## 2018-07-28 ENCOUNTER — Encounter: Admit: 2018-07-28 | Discharge: 2018-07-28

## 2018-07-28 ENCOUNTER — Ambulatory Visit: Admit: 2018-07-28 | Discharge: 2018-07-29

## 2018-07-28 DIAGNOSIS — C50411 Malignant neoplasm of upper-outer quadrant of right female breast: Secondary | ICD-10-CM

## 2018-07-28 DIAGNOSIS — D509 Iron deficiency anemia, unspecified: Secondary | ICD-10-CM

## 2018-07-28 DIAGNOSIS — Z170 Estrogen receptor positive status [ER+]: Secondary | ICD-10-CM

## 2018-07-28 DIAGNOSIS — G629 Polyneuropathy, unspecified: Secondary | ICD-10-CM

## 2018-07-28 DIAGNOSIS — Z9011 Acquired absence of right breast and nipple: Secondary | ICD-10-CM

## 2018-07-28 DIAGNOSIS — R42 Dizziness and giddiness: Secondary | ICD-10-CM

## 2018-07-28 DIAGNOSIS — C50919 Malignant neoplasm of unspecified site of unspecified female breast: Secondary | ICD-10-CM

## 2018-07-28 DIAGNOSIS — J45909 Unspecified asthma, uncomplicated: Secondary | ICD-10-CM

## 2018-07-28 DIAGNOSIS — R51 Headache: Secondary | ICD-10-CM

## 2018-07-28 DIAGNOSIS — Z79811 Long term (current) use of aromatase inhibitors: Secondary | ICD-10-CM

## 2018-07-28 DIAGNOSIS — G4730 Sleep apnea, unspecified: Secondary | ICD-10-CM

## 2018-07-28 DIAGNOSIS — H9190 Unspecified hearing loss, unspecified ear: Secondary | ICD-10-CM

## 2018-07-28 DIAGNOSIS — M818 Other osteoporosis without current pathological fracture: Secondary | ICD-10-CM

## 2018-07-28 DIAGNOSIS — D0511 Intraductal carcinoma in situ of right breast: Secondary | ICD-10-CM

## 2018-07-28 DIAGNOSIS — Z87898 Personal history of other specified conditions: Secondary | ICD-10-CM

## 2018-07-28 DIAGNOSIS — K929 Disease of digestive system, unspecified: Secondary | ICD-10-CM

## 2018-07-28 DIAGNOSIS — M797 Fibromyalgia: Secondary | ICD-10-CM

## 2018-07-28 DIAGNOSIS — M549 Dorsalgia, unspecified: Secondary | ICD-10-CM

## 2018-07-28 DIAGNOSIS — M109 Gout, unspecified: Secondary | ICD-10-CM

## 2018-07-28 DIAGNOSIS — M1990 Unspecified osteoarthritis, unspecified site: Secondary | ICD-10-CM

## 2018-07-28 MED ORDER — GADOBENATE DIMEGLUMINE 529 MG/ML (0.1MMOL/0.2ML) IV SOLN
10 mL | Freq: Once | INTRAVENOUS | 0 refills | Status: CP
Start: 2018-07-28 — End: ?
  Administered 2018-07-28: 23:00:00 10 mL via INTRAVENOUS

## 2018-07-28 MED ORDER — HEPARIN, PORCINE (PF) 100 UNIT/ML IV SYRG
500 [IU] | Freq: Once | 0 refills | Status: CP
Start: 2018-07-28 — End: ?

## 2018-07-28 MED ORDER — DENOSUMAB 60 MG/ML SC SYRG
60 mg | Freq: Once | SUBCUTANEOUS | 0 refills | Status: CP
Start: 2018-07-28 — End: ?
  Administered 2018-07-28: 17:00:00 60 mg via SUBCUTANEOUS

## 2018-07-28 NOTE — Progress Notes
Name: Hailey Stewart          MRN: [id number]      DOB: 1950-11-22      AGE: 67 y.o.   DATE OF SERVICE: 07/28/2018    Subjective:             Reason for Visit:  Heme/Onc Care    Cancer Staging  Malignant neoplasm of upper-outer quadrant of right breast in female, estrogen receptor positive (HCC)  Staging form: Breast, AJCC 8th Edition  - Clinical stage from 07/18/2017: Stage IB (cT2, cN0, cM0, G2, ER+, PR+, HER2-) - Signed by Andrew Au, PA-C on 07/23/2017  - Pathologic stage from 08/15/2017: Stage IA (pT2(2), pN0(sn), cM0, G2, ER+, PR+, HER2-) - Signed by Andrew Au, PA-C on 08/23/2017    Oncology History    Hailey Stewart is a very pleasant 68 y.o.postmenopausal female who presented 07/24/17  for initial consultation regarding her recently diagnosed right breast cancer. She is accompanied by her family. Reported hot flashes, taking Amberen OTC. Voiced she is taking several supplements since having gastric bypass surgery.     Hailey Stewart presented to the [facility] 07/18/2017 for evaluation of right breast mass prior to biopsy.  [name] recently had gastric bypass surgery 03/20/17 and after losing 40 lbs she palpated a right breast lump. A suspicious mass was found in the right breast on the screening/tomo bilateral mammogram 07/04/2017 [facility] A right breast ultrasound 07/05/17 [facility]) confirmed a right breast mass. Right diagnostic mammogram 07/18/17 ([place]) revealed an irregular mass and additional suspicious calcifications in the upper outer right breast. Right ultrasound 07/18/17 ([place]) revealed an irregular 3.3 cm right breast mass at 9:30 with suspicious pleomorphic calcifications with additional suspicious calcifications in the upper outer right breast. She underwent a right sono-guided core needle biopsy 07/18/17 ([place]) which revealed IDC, grade II at 9:30. Clinical stage IB; cT2, cN0, cM0. ER 94.7, PR 20.2, Her2 (0 + by IHC) with a Ki67 of 38.77%. Bilateral MRI 07/30/17 demonstrated a right mass and an additional satellite cancer  immediately posterior to the dominant mass. S/p right mastectomy, SLNB with Dr. Loreta Ave and TE with Dr. Fran Lowes on 08/15/17. Pathology revealed multifocal (2 foci) IDC, grade II with DCIS, grade III. Negative lymph nodes (0/3). Pathologic stage IA; pT2(2), pN0(sn), cM0.    Completed AC, Taxol. Taxol stopped early due to neuropathy.     Current Therapy: Arimidex. Plan to start Prolia.     History of Present Illness    Hailey Stewart returns today for routine f/u. She is taking the Arimidex and overall tolerating well. She continues to have ongoing dizziness which she feels is bothersome. Very mild intermittent headaches.  No change in neuropathy for which she is following with Dr. Chestine Spore.          Review of Systems   Constitutional: Positive for appetite change and fatigue. Negative for activity change, chills, fever and unexpected weight change.   HENT: Positive for tinnitus (chronic). Negative for congestion, dental problem, mouth sores, sore throat and trouble swallowing.    Eyes: Negative for photophobia, itching and visual disturbance.   Respiratory: Positive for apnea (c-pap). Negative for cough, chest tightness, shortness of breath and wheezing.    Cardiovascular: Negative for chest pain, palpitations and leg swelling.   Gastrointestinal: Negative for abdominal distention, abdominal pain, blood in stool, constipation, diarrhea, nausea and vomiting.   Genitourinary: Negative for decreased urine volume, difficulty urinating, dysuria, frequency and urgency.   Musculoskeletal:  Positive for arthralgias (baseline, no concerns voiced today), myalgias (baseline) and neck pain (baseline). Negative for back pain (baseline), gait problem, joint swelling and neck stiffness (baseline, no concerns voiced today).   Skin: Negative for rash. Neurological: Positive for dizziness, numbness and headaches. Negative for weakness and light-headedness.   Hematological: Negative for adenopathy. Does not bruise/bleed easily.   Psychiatric/Behavioral: Negative for sleep disturbance. The patient is not nervous/anxious.      Medical History:   Diagnosis Date   ??? Arthritis    ??? Asthma     as a child   ??? Back pain    ??? Breast cancer (HCC)    ??? Fibromyalgia    ??? Gastrointestinal disorder     GERD   ??? Gout    ??? Hearing reduced    ??? History of palpitations    ??? Neuropathy    ??? Sleep apnea     wears CPAP machine     Surgical History:   Procedure Laterality Date   ??? TONSILLECTOMY  1964   ??? BILATERAL SALPINGO-OOPHORECTOMY  1982   ??? HX HYSTERECTOMY  1992   ??? HX APPENDECTOMY  1992   ??? HX CHOLECYSTECTOMY  2014   ??? HX CATARACT REMOVAL Bilateral 2014   ??? HX ACL RECONSTRUCTION Left 2015   ??? FOOT SURGERY Left 2017   ??? 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 RIGHT SKIN SPARING MASTECTOMY Right 08/15/2017    Performed by Ruthy Dick, DO at [facility]   ??? IDENTIFICATION SENTINEL LYMPH NODE Right 08/15/2017    Performed by Ruthy Dick, DO at [facility]   ??? INJECTION RADIOACTIVE TRACER FOR SENTINEL NODE IDENTIFICATION Right 08/15/2017    Performed by Ruthy Dick, DO at [facility]   ??? SENTINEL LYMPH NODE BIOPSY Right 08/15/2017    Performed by Ruthy Dick, DO at [facility]   ??? RECONSTRUCTION BREAST WITH TISSUE EXPANDER AND SUBSEQUENT EXPANSION - IMMEDIATE/ DELAYED Right 08/15/2017    Performed by Marlinda Mike, MD at [facility]   ??? IMPLANTATION BIOLOGIC IMPLANT FOR SOFT TISSUE REINFORCEMENT Right 08/15/2017    Performed by Marlinda Mike, MD at [facility] OR   ??? PORT PLACEMENT Left 09/20/2017    Performed by Geralyn Flash, MD at [facility] OR   ??? RIGHT TISSUE EXPANDER EXCHANGE Right 10/10/2017    Performed by Marlinda Mike, MD at [facility] OR   ??? REPLACEMENT TISSUE EXPANDER WITH PERMANENT PROSTHESIS Right 05/27/2018    Performed by Marlinda Mike, MD at [facility] OR   ??? INSERTION BREAST PROSTHESIS FOLLOWING MASTOPEXY/ MASTECTOMY/ IN RECONSTRUCTION - IMMEDIATE Left 05/27/2018    Performed by Marlinda Mike, MD at [facility] OR   ??? REVISION RECONSTRUCTED BREAST Right 05/27/2018    Performed by Marlinda Mike, MD at [facility] OR   ??? WRIST SURGERY Left 2000's     Family History   Problem Relation Age of Onset   ??? Diabetes Mother    ??? Arthritis-rheumatoid Mother    ??? Depression Mother    ??? Arthritis-rheumatoid Father    ??? Diabetes Maternal Grandmother    ??? Heart Disease Maternal Grandmother      Social History     Socioeconomic History   ??? Marital status: Married     Spouse name: Not on file   ??? Number of children: Not on file   ??? Years of education: Not on file   ??? Highest education level: Not on file   Occupational History   ???  Not on file   Tobacco Use   ??? Smoking status: Never Smoker   ??? Smokeless tobacco: Never Used   Substance and Sexual Activity   ??? Alcohol use: Yes     Alcohol/week: 1.0 standard drinks     Types: 1 Glasses of wine per week     Frequency: Monthly or less     Binge frequency: Less than monthly     Comment: rare   ??? Drug use: Never   ??? Sexual activity: Not Currently   Other Topics Concern   ??? Not on file   Social History Narrative   ??? Not on file     Objective:         ??? acetaminophen (TYLENOL) 325 mg tablet Take two tablets by mouth every 6 hours. Take q6 hours scheduled for first 3 day, then PRN after   ??? allopurinol (ZYLOPRIM) 100 mg tablet Take 100 mg by mouth daily. Take with food.   ??? anastrozole (ARIMIDEX) 1 mg tablet Take one tablet by mouth daily. Indications: hormone receptor positive breast cancer   ??? ascorbic acid (vitamin C) (VITAMIN-C) 250 mg tablet Take 250 mg by mouth daily.   ??? bimatoprost(+) (LATISSE) 0.03 % eyelash drops Apply one drop to base of eyelashes as directed daily.   ??? biotin 10,000 mcg TbDi Dissolve  by mouth daily.   ??? Cyanocobalamin (VITAMIN B-12) 500 mcg lozg Place 500 mcg under tongue daily.   ??? diazePAM (VALIUM) 5 mg tablet Take one tablet by mouth every 6 hours as needed for Anxiety. ??? duloxetine DR (CYMBALTA) 30 mg capsule TAKE ONE CAPSULE BY MOUTH DAILY    ??? Famotidine-Ca Carb-Mag Hydrox (PEPCID COMPLETE) 10-800-165 mg chew Chew 1 tablet by mouth twice daily.   ??? gabapentin (NEURONTIN) 300 mg capsule Take 300 mg by mouth twice daily.   ??? lidocaine/prilocaine (EMLA) 2.5/2.5 % topical cream Apply to port site 30-45 minutes prior to lab appointment   ??? loperamide (IMODIUM A-D) 2 mg capsule Take 2 capsules by mouth initially, followed by 1 capsule by mouth after each loose stool up to a maximum of 8 tablets in 24 hours.   ??? LORazepam (ATIVAN) 0.5 mg tablet Take one tablet by mouth every 6 hours as needed for Nausea, Vomiting or Other.... Indications: anxious, difficulty sleeping   ??? OLANZapine (ZYPREXA ZYDIS) 10 mg rapid dissolve tablet Dissolve one tablet by mouth at bedtime daily.   ??? other medication Take 1 each by mouth daily. Bariatric Pal Multivitamin 1 daily  Bariatric Multivitamin + Minerals to include daily (minimum): Folic Acid (Folate) 400 mcg, Thiamine 12 mg, Vitamin A 5000 units, Vitamin E 15 IU, Vitamin K 90 mcg Copper 2 mg, and Zinc 11mg    ??? oxyCODONE (ROXICODONE) 5 mg tablet Take one tablet by mouth every 4 hours as needed   ??? pantoprazole DR (PROTONIX) 40 mg tablet Take 40 mg by mouth twice daily.   ??? senna/docusate (SENOKOT-S) 8.6/50 mg tablet Take one tablet by mouth daily. (Patient taking differently: Take 1 tablet by mouth twice daily.)   ??? simvastatin (ZOCOR) 20 mg tablet Take 20 mg by mouth at bedtime daily.   ??? sucralfate (CARAFATE) 1 gram tablet Take one tablet by mouth three times daily. Take on an empty stomach.   ??? turmeric root extract 538 mg cap Take  by mouth daily.     Vitals:    07/28/18 1015   BP: 100/63   BP Source: Arm, Left Upper   Patient Position:  Sitting   Pulse: 65   Resp: 16   Temp: 36.3 ???C (97.3 ???F)   TempSrc: Temporal   SpO2: 100%   Weight: 47.2 kg (104 lb)   Height: 157.5 cm (62)   PainSc: Zero     Body mass index is 19.02 kg/m???.     Pain Score: Zero Fatigue Scale: 2    Pain Addressed:  N/A    Patient Evaluated for a Clinical Trial: No treatment clinical trial available for this patient.     [place] Cooperative Oncology Group performance status is 1, Restricted in physically strenuous activity but ambulatory and able to carry out work of a light or sedentary nature, e.g., light house work, office work.     Physical Exam  Vitals signs reviewed.   Constitutional:       General: She is not in acute distress.     Appearance: Normal appearance. She is well-developed. She is not diaphoretic.   HENT:      Head: Normocephalic and atraumatic.      Mouth/Throat:      Mouth: Mucous membranes are moist. No oral lesions.      Pharynx: No oropharyngeal exudate.   Eyes:      General: No scleral icterus.     Conjunctiva/sclera: Conjunctivae normal.      Pupils: Pupils are equal, round, and reactive to light.   Neck:      Musculoskeletal: Normal range of motion.   Cardiovascular:      Rate and Rhythm: Regular rhythm.   Pulmonary:      Effort: Pulmonary effort is normal. No respiratory distress.      Breath sounds: Normal breath sounds.   Chest:      Breasts:         Right: No bleeding, mass, skin change or tenderness.         Left: No bleeding, inverted nipple, mass, nipple discharge, skin change or tenderness.       Lymphadenopathy:      Cervical: No cervical adenopathy.      Upper Body:      Right upper body: No supraclavicular or axillary adenopathy.      Left upper body: No supraclavicular or axillary adenopathy.   Skin:     Coloration: Skin is not jaundiced or pale.   Neurological:      Mental Status: She is alert and oriented to person, place, and time.   Psychiatric:         Behavior: Behavior normal.         Thought Content: Thought content normal.         Judgment: Judgment normal.          ECHO 09/20/17 LVEF=67%     BONE DENSITY 03/10/2018:    IMPRESSION      Osteoporosis, predominantly involving the right femoral neck.         Assessment and Plan: The patient is a 68 y.o.  female with the following:    1.  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   2.  S/p right mastectomy, SLNB with Dr. Loreta Ave and TE with Dr. Fran Lowes on 08/15/17. Pathology revealed multifocal (2 foci) IDC, grade II with DCIS, grade III. Negative lymph nodes (0/3). Pathologic stage IA; pT2(2), pN0(sn), cM0. Oncotype Dx testing which showed a RS of 27.   3.  History of gastric bypass,  pt unable to take steroids without a protective regimen of PPI plus carafate.   4.  Nausea, rate 1 chemotherapy-induced.  5.  Dizziness, CT head negative, no current issues.   6.  Back pain, intermittent, no current issues.  7.  Anemia, treatment induced with reported iron deficiency  8.  Fatigue, chemotherapy-induced, grade 1  9.  Neuropathy, grade 2-3, chemotherapy-induced  10.  Osteoporosis.      Continue Arimidex.     Brain MRI to evaluate dizziness and HA's.     Continue calcium and vitamin D, and weightbearing exercise. Start Prolia today as planned. Next DEXA due March 2021.    Port flush today and every 6-8 weeks. Ok to have port out.     Mammogram due 05/2019.     Continue F/u with Dr. Fran Lowes.     Continue f/u with Dr. Chestine Spore.     Maynard has asked insightful questions and all questions were answered.     Wonnie was encouraged to call with any persistent symptoms, questions, or concerns.    RTC in 3 months.     Dr. Biagio Borg is the collaborating physician.     Jacqualin Combes, APRN-NP

## 2018-07-28 NOTE — Progress Notes
Cycle 1 Day 1 Prolia    Injection given per plan.   Tolerated well.   Discharged in stable condition.

## 2018-07-28 NOTE — Patient Instructions
Thank you for coming to see us today.   Please call our office or send a message through MyChart if you have any questions or concerns.          Dr. Anne O'Dea and Michelle Prager, APRN  [phone] (Nurse Tara Gardner/Eula Mazzola)  [phone] (fax)    [facility] Location  [address]  [place], [place] [address]    [facility]  [address]  [place], [place] [address]

## 2018-07-28 NOTE — Patient Instructions
Call Immediately to report the following:  Uncontrolled nausea and/or vomiting, uncontrolled pain, or unusual bleeding.  Temperature of 100.4 F or greater and/or any sign/symptom of infection (redness, warmth, tenderness)  Painful mouth or difficulty swallowing  Red, cracked, or painful hands and/or feet  Diarrhea   Swelling of arms or legs  Rash    Important Phone Numbers:  [facility] Main Number (answered 24 hours a day) [phone]  Cancer Center Scheduling (appointments) [phone] OR [phone]  [facility] (for nutritional supplements) [phone]        Port Maintenance - If you have a port, it should be flushed every 6-8 weeks when not in use.  Please check with your MD, nurse, or the scheduler.

## 2018-08-04 ENCOUNTER — Encounter: Admit: 2018-08-04 | Discharge: 2018-08-04

## 2018-08-04 ENCOUNTER — Ambulatory Visit: Admit: 2018-08-04 | Discharge: 2018-08-05

## 2018-08-04 DIAGNOSIS — M1990 Unspecified osteoarthritis, unspecified site: Secondary | ICD-10-CM

## 2018-08-04 DIAGNOSIS — G4730 Sleep apnea, unspecified: Secondary | ICD-10-CM

## 2018-08-04 DIAGNOSIS — K929 Disease of digestive system, unspecified: Secondary | ICD-10-CM

## 2018-08-04 DIAGNOSIS — G629 Polyneuropathy, unspecified: Secondary | ICD-10-CM

## 2018-08-04 DIAGNOSIS — M549 Dorsalgia, unspecified: Secondary | ICD-10-CM

## 2018-08-04 DIAGNOSIS — H9190 Unspecified hearing loss, unspecified ear: Secondary | ICD-10-CM

## 2018-08-04 DIAGNOSIS — C50411 Malignant neoplasm of upper-outer quadrant of right female breast: Secondary | ICD-10-CM

## 2018-08-04 DIAGNOSIS — J45909 Unspecified asthma, uncomplicated: Secondary | ICD-10-CM

## 2018-08-04 DIAGNOSIS — M797 Fibromyalgia: Secondary | ICD-10-CM

## 2018-08-04 DIAGNOSIS — Z87898 Personal history of other specified conditions: Secondary | ICD-10-CM

## 2018-08-04 DIAGNOSIS — M109 Gout, unspecified: Secondary | ICD-10-CM

## 2018-08-04 DIAGNOSIS — N651 Disproportion of reconstructed breast: Principal | ICD-10-CM

## 2018-08-04 DIAGNOSIS — C50919 Malignant neoplasm of unspecified site of unspecified female breast: Secondary | ICD-10-CM

## 2018-08-04 NOTE — Progress Notes
Date of Service: 08/04/2018    Subjective:             Hailey Stewart is a 68 y.o. female.    History of Present Illness  46F 6 weeks post op R TE exchange, L implant augmentation for symmetry.  Doing well.  Pain controlled.  She remains unhappy with lack of breast volume.  In addition she has cascading of L breast tissue over her implant.       Review of Systems   Constitutional: Negative.    HENT: Negative.    Eyes: Negative.    Respiratory: Negative.    Cardiovascular: Negative.    Gastrointestinal: Negative.    Endocrine: Negative.    Genitourinary: Negative.    Musculoskeletal: Negative.    Skin: Negative.    Allergic/Immunologic: Negative.    Neurological: Negative.    Hematological: Negative.    Psychiatric/Behavioral: Negative.          Objective:         ??? acetaminophen (TYLENOL) 325 mg tablet Take two tablets by mouth every 6 hours. Take q6 hours scheduled for first 3 day, then PRN after   ??? allopurinol (ZYLOPRIM) 100 mg tablet Take 100 mg by mouth daily. Take with food.   ??? anastrozole (ARIMIDEX) 1 mg tablet Take one tablet by mouth daily. Indications: hormone receptor positive breast cancer   ??? ascorbic acid (vitamin C) (VITAMIN-C) 250 mg tablet Take 250 mg by mouth daily.   ??? bimatoprost(+) (LATISSE) 0.03 % eyelash drops Apply one drop to base of eyelashes as directed daily.   ??? biotin 10,000 mcg TbDi Dissolve  by mouth daily.   ??? Cyanocobalamin (VITAMIN B-12) 500 mcg lozg Place 500 mcg under tongue daily.   ??? diazePAM (VALIUM) 5 mg tablet Take one tablet by mouth every 6 hours as needed for Anxiety.   ??? duloxetine DR (CYMBALTA) 30 mg capsule TAKE ONE CAPSULE BY MOUTH DAILY    ??? Famotidine-Ca Carb-Mag Hydrox (PEPCID COMPLETE) 10-800-165 mg chew Chew 1 tablet by mouth twice daily.   ??? gabapentin (NEURONTIN) 300 mg capsule Take 300 mg by mouth twice daily.   ??? lidocaine/prilocaine (EMLA) 2.5/2.5 % topical cream Apply to port site 30-45 minutes prior to lab appointment ??? loperamide (IMODIUM A-D) 2 mg capsule Take 2 capsules by mouth initially, followed by 1 capsule by mouth after each loose stool up to a maximum of 8 tablets in 24 hours.   ??? LORazepam (ATIVAN) 0.5 mg tablet Take one tablet by mouth every 6 hours as needed for Nausea, Vomiting or Other.... Indications: anxious, difficulty sleeping   ??? OLANZapine (ZYPREXA ZYDIS) 10 mg rapid dissolve tablet Dissolve one tablet by mouth at bedtime daily.   ??? other medication Take 1 each by mouth daily. Bariatric Pal Multivitamin 1 daily  Bariatric Multivitamin + Minerals to include daily (minimum): Folic Acid (Folate) 400 mcg, Thiamine 12 mg, Vitamin A 5000 units, Vitamin E 15 IU, Vitamin K 90 mcg Copper 2 mg, and Zinc 11mg    ??? pantoprazole DR (PROTONIX) 40 mg tablet Take 40 mg by mouth twice daily.   ??? senna/docusate (SENOKOT-S) 8.6/50 mg tablet Take one tablet by mouth daily. (Patient taking differently: Take 1 tablet by mouth twice daily.)   ??? simvastatin (ZOCOR) 20 mg tablet Take 20 mg by mouth at bedtime daily.   ??? sucralfate (CARAFATE) 1 gram tablet Take one tablet by mouth three times daily. Take on an empty stomach.   ??? turmeric root extract  538 mg cap Take  by mouth daily.     Vitals:    08/04/18 1150   BP: 96/56   Pulse: 70   Weight: 45.8 kg (101 lb)   Height: 157.5 cm (62)     Body mass index is 18.47 kg/m???.     Physical Exam  Chest:              Assessment and Plan:  61F s/p TE exchange, and R breast implant for symmetry.  Doing well.  Marked ptosis on both sides.  Patient maintains that implants are still not large enough  -Patient has device on 100lb frame.  Satisfied with additional sizer.    -Paitent has marked ptosis of L breast esp in face of previous MWL.  Discussed difficulty of lifting tissue in the face of her ptotic nipple.  Patient asked to have nipple removed  -Patient also desires removal of port  -Coordinate OR date  -RTC for preop

## 2018-08-05 DIAGNOSIS — Z170 Estrogen receptor positive status [ER+]: Secondary | ICD-10-CM

## 2018-08-08 ENCOUNTER — Encounter: Admit: 2018-08-08 | Discharge: 2018-08-08

## 2018-08-13 ENCOUNTER — Encounter: Admit: 2018-08-13 | Discharge: 2018-08-13

## 2018-08-13 NOTE — Telephone Encounter
Called and lvm for pt regarding surgery scheduling.

## 2018-08-18 ENCOUNTER — Encounter: Admit: 2018-08-18 | Discharge: 2018-08-18

## 2018-08-18 DIAGNOSIS — N651 Disproportion of reconstructed breast: Secondary | ICD-10-CM

## 2018-08-18 DIAGNOSIS — Z1159 Encounter for screening for other viral diseases: Secondary | ICD-10-CM

## 2018-08-19 ENCOUNTER — Ambulatory Visit: Admit: 2018-08-19 | Discharge: 2018-08-19

## 2018-08-19 DIAGNOSIS — N651 Disproportion of reconstructed breast: Secondary | ICD-10-CM

## 2018-09-01 ENCOUNTER — Encounter: Admit: 2018-09-01 | Discharge: 2018-09-01

## 2018-09-01 DIAGNOSIS — M797 Fibromyalgia: Secondary | ICD-10-CM

## 2018-09-01 DIAGNOSIS — Z87898 Personal history of other specified conditions: Secondary | ICD-10-CM

## 2018-09-01 DIAGNOSIS — C50919 Malignant neoplasm of unspecified site of unspecified female breast: Secondary | ICD-10-CM

## 2018-09-01 DIAGNOSIS — J45909 Unspecified asthma, uncomplicated: Secondary | ICD-10-CM

## 2018-09-01 DIAGNOSIS — H9190 Unspecified hearing loss, unspecified ear: Secondary | ICD-10-CM

## 2018-09-01 DIAGNOSIS — K929 Disease of digestive system, unspecified: Secondary | ICD-10-CM

## 2018-09-01 DIAGNOSIS — M109 Gout, unspecified: Secondary | ICD-10-CM

## 2018-09-01 DIAGNOSIS — M1990 Unspecified osteoarthritis, unspecified site: Secondary | ICD-10-CM

## 2018-09-01 DIAGNOSIS — G4730 Sleep apnea, unspecified: Secondary | ICD-10-CM

## 2018-09-01 DIAGNOSIS — M549 Dorsalgia, unspecified: Secondary | ICD-10-CM

## 2018-09-01 DIAGNOSIS — G629 Polyneuropathy, unspecified: Secondary | ICD-10-CM

## 2018-09-01 NOTE — Progress Notes
[facility] phone triage completed with patient for surgery on 09/24/18 with Dr. Billie Ruddy. Medications, allergies and medical history reviewed and updated in chart. She denies CP, SOA, dizziness, numbness, tingling, N/V/D, cough, fever, flu-like symptoms, exposure to anyone ill, COVID-19 positive or waiting for results.  She denies travel outside of [place] or [place] in past 15 days.  She denies PMH of complications with anesthesia.  She states she can climb 2 flights of stairs without developing CP/SOA.  Denies URI symptoms at this time. No PAC visit indicated.    Preop and medication instructions reviewed with patient. No vitamins or supplements for 14 days and no NSAIDS for 7 days before surgery.   NPO after 11 pm the night before surgery but ok to drink water until 2 hours before arrival at hospital. Patient verbalized understanding and copy of instructions were placed in MyChart.    Patient informed of mask and visitor policy and expressed understanding.    Patient reminded of her COVID-19 test scheduled for 09/22/2018 at 9:50 am [facility].

## 2018-09-01 NOTE — Pre-Anesthesia Patient Instructions
GENERAL INFORMATION    Before you come to the hospital  Make arrangements for a responsible adult to drive you home and stay with you for 24 hours following surgery.  Bath/Shower Instructions  Take a bath or shower with antibacterial soap the night before or the morning of your procedure. Use clean towels.  Put on clean clothes after bath or shower.  Avoid using lotion and oils.  If you are having surgery above the waist, wear a shirt that fastens up the front.  Sleep on clean sheets if bath or shower is done the night before procedure.  Leave money, credit cards, jewelry, and any other valuables at home. The [facility] is not responsible for the loss or breakage of personal items.  Remove nail polish, makeup and all jewelry (including piercings) before coming to the hospital.  The morning of your procedure:  brush your teeth and tongue  do not smoke  do not shave the area where you will have surgery    What to bring to the hospital  ID/ Insurance Card  Medical Device card  Official documents for legal guardianship   Copy of your Living Will, Advanced Directives, and/or Durable Power of Attorney   Small bag with a few personal belongings  CPAP/BiPAP machine (including all supplies)  Cases for glasses/hearing aids/contact lens (bring solutions for contacts)  Dress in clean, loose, comfortable clothing     Eating or drinking before surgery  Do not eat or drink anything after 11:00 p.m. the day before your procedure (including gum, mints, candy, or chewing tobacco) OR follow the specific instructions you were given by your Surgeon.  You may have WATER ONLY up to 2 hours before arriving at the hospital.     Other instructions  Notify your surgeon if:  you become ill with a cough, fever, sore throat, nausea, vomiting or flu-like symptoms  you have any open wounds/sores that are red, painful, draining, or are new since you last saw  the doctor  you need to cancel your procedure You will receive a call with your surgery arrival time from between 2:30pm and 4:30pm the last business day before your procedure.  If you do not receive a call, please call [phone] before 4:30pm or [phone] after 4:30pm.    Notify us at [facility]): [phone]  if you need to cancel your procedure  if you are going to be late    Arrival at the hospital    [facility]  [address]  [place], [place] [address]    Park in the [employer], located directly across from the main entrance to the hospital.  Judee Clara parking is available  from 7 AM to 4 PM Monday through Friday.  Enter through the ground floor main hospital entrance and check in at the Information Desk in the lobby.  They will validate your parking ticket and direct you to the next location.    For the safety of all patients, visitors and staff as we work to contain COVID-19, we must dramatically restrict patient visitors.      Current Visitor Policy (06/12/18):  One visitor per patient per day.  Exceptions include:    Two parents/guardian for patients younger than 42.  End-of-life patients may be allowed additional support persons.  Visitors should check with the patient's nurse.  Only cancer patients at their exam visit may have one visitor with them.  No visitors are allowed for cancer patients receiving  treatment/infusion services.  This applies at all cancer center locations.  Restrictions still apply for patients who test positive for COVID-19 (no visitors).    Visitors will continue to be screened at all entrances.  They must be free of fever and symptoms to be in our facilities.  We ask visitors to follow these guidelines:  Wear a mask at all times.  Go directly to the nursing station in the unit you are visiting and do not linger in public areas.  Check in at the nursing station before going to the patient's room.  Maintain a physical distance of six feet from all others. Follow elevator restrictions to four riding at a time - peak times are 6:30-7:30 a.m., noon and 6:30-7:30 p.m.  Be aware cafeteria peak times are 11 a.m. - 1 p.m.  Wash your hands frequently and cover your coughs and sneezes.

## 2018-09-01 NOTE — Pre-Anesthesia Medication Instructions
YOUR MEDICATION LIST     acetaminophen (TYLENOL) 325 mg tablet Take two tablets by mouth every 6 hours. Take q6 hours scheduled for first 3 day, then PRN after    allopurinol (ZYLOPRIM) 100 mg tablet Take 100 mg by mouth daily. Take with food.    anastrozole (ARIMIDEX) 1 mg tablet Take one tablet by mouth daily. Indications: hormone receptor positive breast cancer    ascorbic acid (vitamin C) (VITAMIN-C) 250 mg tablet Take 250 mg by mouth daily.    biotin 10,000 mcg TbDi Dissolve  by mouth daily.    calcium carb/vit D2/minerals (CALTRATE PLUS PO) Take  by mouth.    Cyanocobalamin (VITAMIN B-12) 500 mcg lozg Place 500 mcg under tongue daily.    diazePAM (VALIUM) 5 mg tablet Take one tablet by mouth every 6 hours as needed for Anxiety.    DOCUSATE SODIUM PO Take  by mouth.    duloxetine DR (CYMBALTA) 30 mg capsule TAKE ONE CAPSULE BY MOUTH DAILY     Famotidine-Ca Carb-Mag Hydrox (PEPCID COMPLETE) 10-800-165 mg chew Chew 1 tablet by mouth twice daily.    gabapentin (NEURONTIN) 300 mg capsule Take 300 mg by mouth twice daily.    lidocaine/prilocaine (EMLA) 2.5/2.5 % topical cream Apply to port site 30-45 minutes prior to lab appointment    loperamide (IMODIUM A-D) 2 mg capsule Take 2 capsules by mouth initially, followed by 1 capsule by mouth after each loose stool up to a maximum of 8 tablets in 24 hours.    LORazepam (ATIVAN) 0.5 mg tablet Take one tablet by mouth every 6 hours as needed for Nausea, Vomiting or Other.... Indications: anxious, difficulty sleeping    multivit-min/folic acid/biotin (HAIR,SKIN AND NAILS(FA-BIOTIN) PO) Take  by mouth.    other medication Take 1 each by mouth daily. Bariatric Pal Multivitamin 1 daily  Bariatric Multivitamin + Minerals to include daily (minimum): Folic Acid (Folate) 400 mcg, Thiamine 12 mg, Vitamin A 5000 units, Vitamin E 15 IU, Vitamin K 90 mcg Copper 2 mg, and Zinc 11mg     pantoprazole DR (PROTONIX) 40 mg tablet Take 40 mg by mouth twice daily. senna/docusate (SENOKOT-S) 8.6/50 mg tablet Take one tablet by mouth daily. (Patient taking differently: Take 1 tablet by mouth twice daily.)    simvastatin (ZOCOR) 20 mg tablet Take 20 mg by mouth at bedtime daily.    sucralfate (CARAFATE) 1 gram tablet Take one tablet by mouth three times daily. Take on an empty stomach.    turmeric root extract 538 mg cap Take  by mouth daily.    vitamins, B complex tab Take 1 tablet by mouth daily.         YOUR MEDICATION INSTRUCTIONS FOR SURGERY    Please continue taking your medicine as your doctor has told you to UNLESS it is listed to stop below.      14 DAYS BEFORE SURGERY  Do not take the following vitamins, herbals, and supplements:  Vitamin C  Biotin  Bariatric Multivitamin  Turmeric Root Extract  Hair, Skin and Nails with Biotin  Vitamin B Complex  Do not start any new vitamins, herbals, or natural supplements before surgery.      7 DAYS BEFORE SURGERY  DO NOT TAKE the following anti-inflammatory medications such as ibuprofen (Advil, Motrin) and naproxen (Aleve)  You may use acetaminophen (Tylenol)      DAY BEFORE SURGERY  TAKE your medications the day before surgery as usual unless told differently      MORNING OF SURGERY  DO NOT take these medications:  Remaining vitamins/supplements  Ointments/creams/lotions/oils/powders/deodorant  Allopurinol  Loperamide  Senna/Docusate  Sucralfate  Docusate Sodium    TAKE these medications with a sip (1-2 ounces) of water:  Anastrozole  Duloxetine  Pepcid Complete  Gabapentin  Pantoprazole  Simvastatin  Use all inhalers, nasal sprays, and eye drops as usual  May take if needed:  Diazepam  Lorazepam      Please contact Debbie, RN, with any changes to your medication list or medication questions before surgery.  E-mail:  [email or web address]    Before going home from the hospital, please ask your doctor when you should re-start any medicine that was stopped for surgery.

## 2018-09-11 ENCOUNTER — Encounter: Admit: 2018-09-11 | Discharge: 2018-09-11

## 2018-09-11 ENCOUNTER — Ambulatory Visit: Admit: 2018-09-11 | Discharge: 2018-09-12

## 2018-09-11 DIAGNOSIS — G4730 Sleep apnea, unspecified: Secondary | ICD-10-CM

## 2018-09-11 DIAGNOSIS — N651 Disproportion of reconstructed breast: Secondary | ICD-10-CM

## 2018-09-11 DIAGNOSIS — Z87898 Personal history of other specified conditions: Secondary | ICD-10-CM

## 2018-09-11 DIAGNOSIS — K929 Disease of digestive system, unspecified: Secondary | ICD-10-CM

## 2018-09-11 DIAGNOSIS — C50919 Malignant neoplasm of unspecified site of unspecified female breast: Secondary | ICD-10-CM

## 2018-09-11 DIAGNOSIS — M549 Dorsalgia, unspecified: Secondary | ICD-10-CM

## 2018-09-11 DIAGNOSIS — G629 Polyneuropathy, unspecified: Secondary | ICD-10-CM

## 2018-09-11 DIAGNOSIS — J45909 Unspecified asthma, uncomplicated: Secondary | ICD-10-CM

## 2018-09-11 DIAGNOSIS — M1990 Unspecified osteoarthritis, unspecified site: Secondary | ICD-10-CM

## 2018-09-11 DIAGNOSIS — M797 Fibromyalgia: Secondary | ICD-10-CM

## 2018-09-11 DIAGNOSIS — M109 Gout, unspecified: Secondary | ICD-10-CM

## 2018-09-11 DIAGNOSIS — H9190 Unspecified hearing loss, unspecified ear: Secondary | ICD-10-CM

## 2018-09-12 ENCOUNTER — Encounter: Admit: 2018-09-12 | Discharge: 2018-09-12

## 2018-09-12 DIAGNOSIS — M549 Dorsalgia, unspecified: Secondary | ICD-10-CM

## 2018-09-12 DIAGNOSIS — K929 Disease of digestive system, unspecified: Secondary | ICD-10-CM

## 2018-09-12 DIAGNOSIS — G4730 Sleep apnea, unspecified: Secondary | ICD-10-CM

## 2018-09-12 DIAGNOSIS — M797 Fibromyalgia: Secondary | ICD-10-CM

## 2018-09-12 DIAGNOSIS — Z87898 Personal history of other specified conditions: Secondary | ICD-10-CM

## 2018-09-12 DIAGNOSIS — M109 Gout, unspecified: Secondary | ICD-10-CM

## 2018-09-12 DIAGNOSIS — G629 Polyneuropathy, unspecified: Secondary | ICD-10-CM

## 2018-09-12 DIAGNOSIS — M1990 Unspecified osteoarthritis, unspecified site: Secondary | ICD-10-CM

## 2018-09-12 DIAGNOSIS — H9190 Unspecified hearing loss, unspecified ear: Secondary | ICD-10-CM

## 2018-09-12 DIAGNOSIS — J45909 Unspecified asthma, uncomplicated: Secondary | ICD-10-CM

## 2018-09-12 DIAGNOSIS — C50919 Malignant neoplasm of unspecified site of unspecified female breast: Secondary | ICD-10-CM

## 2018-09-12 NOTE — Telephone Encounter
Pt having breast recon sx on 9/16. She was asking if she could go ride on a motorcycle with her husband on 9/20. I told her I would not advise this because she will be sore, swollen, and tender from sx and bouncing on a motorcycle and hugging someone on one will not be comfortable for her. She asked if she could follow along in the car behind them. I said she could as long as she is not on any medications. No further questions.

## 2018-09-22 ENCOUNTER — Encounter: Admit: 2018-09-22 | Discharge: 2018-09-23 | Payer: MEDICARE

## 2018-09-23 ENCOUNTER — Encounter: Admit: 2018-09-23 | Discharge: 2018-09-23 | Payer: MEDICARE

## 2018-09-23 ENCOUNTER — Ambulatory Visit: Admit: 2018-09-23 | Discharge: 2018-09-23 | Payer: MEDICARE

## 2018-09-23 LAB — COVID-19 (SARS-COV-2) PCR

## 2018-09-24 ENCOUNTER — Encounter: Admit: 2018-09-24 | Discharge: 2018-09-24 | Payer: MEDICARE

## 2018-09-24 ENCOUNTER — Ambulatory Visit: Admit: 2018-09-24 | Discharge: 2018-09-24 | Payer: MEDICARE

## 2018-09-24 MED ORDER — LIDOCAINE (PF) 1% 30 ML/EPINEPHRINE 1MG/LR 1000 ML IRR (OR)
0 refills | Status: DC
Start: 2018-09-24 — End: 2018-09-24
  Administered 2018-09-24 (×3): 600 mL

## 2018-09-24 MED ORDER — DEXTRAN 70-HYPROMELLOSE (PF) 0.1-0.3 % OP DPET
0 refills | Status: DC
Start: 2018-09-24 — End: 2018-09-24
  Administered 2018-09-24: 15:00:00 2 [drp] via OPHTHALMIC

## 2018-09-24 MED ORDER — OXYCODONE 5 MG PO TAB
5 mg | ORAL_TABLET | ORAL | 0 refills | 6.00000 days | Status: DC | PRN
Start: 2018-09-24 — End: 2018-10-28

## 2018-09-24 MED ORDER — SENNOSIDES 8.6 MG PO TAB
1 | ORAL_TABLET | Freq: Two times a day (BID) | ORAL | 1 refills | Status: CN
Start: 2018-09-24 — End: ?

## 2018-09-24 MED ORDER — SUCCINYLCHOLINE CHLORIDE 20 MG/ML IJ SOLN
INTRAVENOUS | 0 refills | Status: DC
Start: 2018-09-24 — End: 2018-09-24
  Administered 2018-09-24: 15:00:00 100 mg via INTRAVENOUS

## 2018-09-24 MED ORDER — FENTANYL CITRATE (PF) 50 MCG/ML IJ SOLN
50 ug | INTRAVENOUS | 0 refills | Status: DC | PRN
Start: 2018-09-24 — End: 2018-09-24

## 2018-09-24 MED ORDER — KETOROLAC 30 MG/ML (1 ML) IJ SOLN
0 refills | Status: DC
Start: 2018-09-24 — End: 2018-09-24
  Administered 2018-09-24: 18:00:00 15 mg via INTRAVENOUS

## 2018-09-24 MED ORDER — ACETAMINOPHEN 500 MG PO TAB
1000 mg | ORAL_TABLET | ORAL | 0 refills | Status: DC
Start: 2018-09-24 — End: 2018-10-28

## 2018-09-24 MED ORDER — DOXYCYCLINE HYCLATE 100 MG PO TAB
100 mg | ORAL_TABLET | Freq: Two times a day (BID) | ORAL | 0 refills | 8.00000 days | Status: AC
Start: 2018-09-24 — End: ?

## 2018-09-24 MED ORDER — ONDANSETRON HCL (PF) 4 MG/2 ML IJ SOLN
INTRAVENOUS | 0 refills | Status: DC
Start: 2018-09-24 — End: 2018-09-24
  Administered 2018-09-24: 17:00:00 4 mg via INTRAVENOUS

## 2018-09-24 MED ORDER — PROPOFOL INJ 10 MG/ML IV VIAL
0 refills | Status: DC
Start: 2018-09-24 — End: 2018-09-24
  Administered 2018-09-24: 15:00:00 100 mg via INTRAVENOUS

## 2018-09-24 MED ORDER — LIDOCAINE (PF) 1% 30 ML/EPINEPHRINE 1MG/LR 1000 ML IRR (OR)
Freq: Once | 0 refills | Status: DC
Start: 2018-09-24 — End: 2018-09-24

## 2018-09-24 MED ORDER — FENTANYL CITRATE (PF) 50 MCG/ML IJ SOLN
0 refills | Status: DC
Start: 2018-09-24 — End: 2018-09-24
  Administered 2018-09-24: 15:00:00 100 ug via INTRAVENOUS

## 2018-09-24 MED ORDER — DEXAMETHASONE SODIUM PHOSPHATE 4 MG/ML IJ SOLN
INTRAVENOUS | 0 refills | Status: DC
Start: 2018-09-24 — End: 2018-09-24
  Administered 2018-09-24: 16:00:00 4 mg via INTRAVENOUS

## 2018-09-24 MED ORDER — PHENYLEPHRINE 40 MCG/ML IN NS IV DRIP (STD CONC)
0 refills | Status: DC
Start: 2018-09-24 — End: 2018-09-24
  Administered 2018-09-24 (×2): .3 ug/kg/min via INTRAVENOUS

## 2018-09-24 MED ORDER — ONDANSETRON HCL (PF) 4 MG/2 ML IJ SOLN
4 mg | Freq: Once | INTRAVENOUS | 0 refills | Status: DC | PRN
Start: 2018-09-24 — End: 2018-09-24

## 2018-09-24 MED ORDER — MUPIROCIN 2 % TP OINT
0 refills | Status: DC
Start: 2018-09-24 — End: 2018-09-24
  Administered 2018-09-24: 17:00:00 1 via TOPICAL

## 2018-09-24 MED ORDER — GLYCOPYRROLATE 0.2 MG/ML IJ SOLN
0 refills | Status: DC
Start: 2018-09-24 — End: 2018-09-24
  Administered 2018-09-24 (×2): 0.2 mg via INTRAVENOUS

## 2018-09-24 MED ORDER — PHENYLEPHRINE IN 0.9% NACL(PF) 1 MG/10 ML (100 MCG/ML) IV SYRG
INTRAVENOUS | 0 refills | Status: DC
Start: 2018-09-24 — End: 2018-09-24
  Administered 2018-09-24 (×4): 100 ug via INTRAVENOUS

## 2018-09-24 MED ORDER — LIDOCAINE (PF) 200 MG/10 ML (2 %) IJ SYRG
0 refills | Status: DC
Start: 2018-09-24 — End: 2018-09-24
  Administered 2018-09-24: 15:00:00 60 mg via INTRAVENOUS

## 2018-09-24 MED ORDER — PROMETHAZINE 25 MG/ML IJ SOLN
6.25 mg | INTRAVENOUS | 0 refills | Status: DC | PRN
Start: 2018-09-24 — End: 2018-09-24

## 2018-09-24 MED ORDER — HYDROMORPHONE (PF) 2 MG/ML IJ SYRG
0 refills | Status: DC
Start: 2018-09-24 — End: 2018-09-24
  Administered 2018-09-24: 17:00:00 0.5 mg via INTRAVENOUS

## 2018-09-24 MED ORDER — ROCURONIUM 10 MG/ML IV SOLN
INTRAVENOUS | 0 refills | Status: DC
Start: 2018-09-24 — End: 2018-09-24
  Administered 2018-09-24: 17:00:00 10 mg via INTRAVENOUS
  Administered 2018-09-24: 16:00:00 30 mg via INTRAVENOUS

## 2018-09-24 MED ORDER — HALOPERIDOL LACTATE 5 MG/ML IJ SOLN
0 refills | Status: DC
Start: 2018-09-24 — End: 2018-09-24
  Administered 2018-09-24: 17:00:00 1 mg via INTRAVENOUS

## 2018-09-24 MED ORDER — LACTATED RINGERS IV SOLP
1000 mL | INTRAVENOUS | 0 refills | Status: DC
Start: 2018-09-24 — End: 2018-09-24
  Administered 2018-09-24: 16:00:00 1000.000 mL via INTRAVENOUS

## 2018-09-24 MED ORDER — SUGAMMADEX 100 MG/ML IV SOLN
INTRAVENOUS | 0 refills | Status: DC
Start: 2018-09-24 — End: 2018-09-24
  Administered 2018-09-24: 18:00:00 100 mg via INTRAVENOUS

## 2018-09-24 MED ORDER — CEFAZOLIN 1 GRAM IJ SOLR
0 refills | Status: DC
Start: 2018-09-24 — End: 2018-09-24
  Administered 2018-09-24: 16:00:00 2 g via INTRAVENOUS

## 2018-09-24 MED ADMIN — LACTATED RINGERS IV SOLP [4318]: 1000 mL | INTRAVENOUS | @ 15:00:00 | Stop: 2018-09-24 | NDC 00338011704

## 2018-09-26 ENCOUNTER — Encounter: Admit: 2018-09-26 | Discharge: 2018-09-26 | Payer: MEDICARE

## 2018-10-02 ENCOUNTER — Ambulatory Visit: Admit: 2018-10-02 | Discharge: 2018-10-03 | Payer: MEDICARE

## 2018-10-02 ENCOUNTER — Encounter: Admit: 2018-10-02 | Discharge: 2018-10-02 | Payer: MEDICARE

## 2018-10-02 NOTE — Progress Notes
Date of Service: 10/02/2018    Subjective:             Hailey Stewart is a 68 y.o. female. Patient returns to clinic for postoperative appointment.  Patient feels that her left breast is slightly larger than the right breast. Patient states she would like her breasts to be larger. Patient states she has had minimal discomfort from her fat grafting abdominal donor site. Body mass index is 19.02 kg/m?Marland Kitchen    Allergies   Allergen Reactions   ? Garamycin [Gentamicin] ANAPHYLAXIS        Review of Systems   Constitutional: Negative.    HENT: Negative.    Eyes: Negative.    Respiratory: Negative.    Cardiovascular: Negative.    Gastrointestinal: Negative.    Endocrine: Negative.    Genitourinary: Negative.    Musculoskeletal: Negative.    Skin: Negative.    Allergic/Immunologic: Negative.    Neurological: Negative.    Hematological: Negative.    Psychiatric/Behavioral: Negative.      Objective:         ? acetaminophen (TYLENOL) 325 mg tablet Take two tablets by mouth every 6 hours. Take q6 hours scheduled for first 3 day, then PRN after   ? acetaminophen (TYLENOL) 500 mg tablet Take two tablets by mouth every 6 hours. Max of 4,000 mg of acetaminophen in 24 hours. Please take 2 Tablets (1000 mg) every 6 hrs for the first three days after your procedure. After three days, you may take Tylenol every 6 hours as needed.   ? allopurinol (ZYLOPRIM) 100 mg tablet Take 100 mg by mouth daily. Take with food.   ? anastrozole (ARIMIDEX) 1 mg tablet Take one tablet by mouth daily. Indications: hormone receptor positive breast cancer   ? ascorbic acid (vitamin C) (VITAMIN-C) 250 mg tablet Take 250 mg by mouth daily.   ? biotin 10,000 mcg TbDi Dissolve  by mouth daily.   ? calcium carb/vit D2/minerals (CALTRATE PLUS PO) Take  by mouth.   ? Cyanocobalamin (VITAMIN B-12) 500 mcg lozg Place 500 mcg under tongue daily.   ? diazePAM (VALIUM) 5 mg tablet Take one tablet by mouth every 6 hours as needed for Anxiety. ? DOCUSATE SODIUM PO Take  by mouth.   ? doxycycline (VIBRAMYCIN) 100 mg tablet Take one tablet by mouth twice daily for 10 days.   ? duloxetine DR (CYMBALTA) 30 mg capsule TAKE ONE CAPSULE BY MOUTH DAILY    ? Famotidine-Ca Carb-Mag Hydrox (PEPCID COMPLETE) 10-800-165 mg chew Chew 1 tablet by mouth twice daily.   ? gabapentin (NEURONTIN) 300 mg capsule Take 300 mg by mouth twice daily.   ? lidocaine/prilocaine (EMLA) 2.5/2.5 % topical cream Apply to port site 30-45 minutes prior to lab appointment   ? loperamide (IMODIUM A-D) 2 mg capsule Take 2 capsules by mouth initially, followed by 1 capsule by mouth after each loose stool up to a maximum of 8 tablets in 24 hours.   ? LORazepam (ATIVAN) 0.5 mg tablet Take one tablet by mouth every 6 hours as needed for Nausea, Vomiting or Other.... Indications: anxious, difficulty sleeping   ? multivit-min/folic acid/biotin (HAIR,SKIN AND NAILS(FA-BIOTIN) PO) Take  by mouth.   ? other medication Take 1 each by mouth daily. Bariatric Pal Multivitamin 1 daily  Bariatric Multivitamin + Minerals to include daily (minimum): Folic Acid (Folate) 400 mcg, Thiamine 12 mg, Vitamin A 5000 units, Vitamin E 15 IU, Vitamin K 90 mcg Copper 2 mg, and Zinc 11mg    ?  oxyCODONE (ROXICODONE) 5 mg tablet Take one tablet by mouth every 4 hours as needed for Pain  Please do not exceed 6 tablets in a 24 hour period.   ? pantoprazole DR (PROTONIX) 40 mg tablet Take 40 mg by mouth twice daily.   ? senna (SENOKOT) 8.6 mg tablet Take one tablet by mouth twice daily. Please take while on narcotic pain medication. Please hold for loose stools.   ? senna/docusate (SENOKOT-S) 8.6/50 mg tablet Take one tablet by mouth daily. (Patient taking differently: Take 1 tablet by mouth twice daily.)   ? simvastatin (ZOCOR) 20 mg tablet Take 20 mg by mouth at bedtime daily.   ? sucralfate (CARAFATE) 1 gram tablet Take one tablet by mouth three times daily. Take on an empty stomach. ? turmeric root extract 538 mg cap Take  by mouth daily.   ? vitamins, B complex tab Take 1 tablet by mouth daily.     Vitals:    10/02/18 1031   BP: 119/74   BP Source: Arm, Left Upper   Patient Position: Sitting   Pulse: 59   Temp: 36.6 ?C (97.9 ?F)   TempSrc: Oral   Weight: 47.2 kg (104 lb)   Height: 157.5 cm (62)   PainSc: Zero     Physical Exam  Vitals signs reviewed.   Constitutional:       Appearance: She is well-groomed.   HENT:      Head: Normocephalic.   Pulmonary:      Effort: Pulmonary effort is normal.   Chest:       Abdominal:      Palpations: Abdomen is soft.      Tenderness: There is no abdominal tenderness.       Skin:     General: Skin is warm and dry.   Neurological:      Mental Status: She is alert.   Psychiatric:         Mood and Affect: Mood normal.         Behavior: Behavior normal. Behavior is cooperative.          Assessment and Plan:  68 y/o female. S/p Bilateral breast implant exchange, fat grafting to bilateral breast and removal of port.       Patient does not need to wear abdominal binder.   RTC in one month

## 2018-10-23 NOTE — Patient Instructions
Thank you for coming to see us today.   Please call our office or send a message through MyChart if you have any questions or concerns.          Dr. Anne O'Dea and Michelle Prager, APRN  [phone] (Nurse Marca Gadsby/Michele Kuester)  [phone] (fax)    [facility] Location  [address]  [place], [place] [address]    [facility]  [address]  [place], [place] [address]

## 2018-10-25 ENCOUNTER — Encounter: Admit: 2018-10-25 | Discharge: 2018-10-25 | Payer: MEDICARE

## 2018-10-28 ENCOUNTER — Encounter: Admit: 2018-10-28 | Discharge: 2018-10-28 | Payer: MEDICARE

## 2018-10-28 DIAGNOSIS — Z87898 Personal history of other specified conditions: Secondary | ICD-10-CM

## 2018-10-28 DIAGNOSIS — J45909 Unspecified asthma, uncomplicated: Secondary | ICD-10-CM

## 2018-10-28 DIAGNOSIS — M1990 Unspecified osteoarthritis, unspecified site: Secondary | ICD-10-CM

## 2018-10-28 DIAGNOSIS — H9190 Unspecified hearing loss, unspecified ear: Secondary | ICD-10-CM

## 2018-10-28 DIAGNOSIS — G629 Polyneuropathy, unspecified: Secondary | ICD-10-CM

## 2018-10-28 DIAGNOSIS — M549 Dorsalgia, unspecified: Secondary | ICD-10-CM

## 2018-10-28 DIAGNOSIS — C50919 Malignant neoplasm of unspecified site of unspecified female breast: Secondary | ICD-10-CM

## 2018-10-28 DIAGNOSIS — K929 Disease of digestive system, unspecified: Secondary | ICD-10-CM

## 2018-10-28 DIAGNOSIS — G4730 Sleep apnea, unspecified: Secondary | ICD-10-CM

## 2018-10-28 DIAGNOSIS — M109 Gout, unspecified: Secondary | ICD-10-CM

## 2018-10-28 DIAGNOSIS — M797 Fibromyalgia: Secondary | ICD-10-CM

## 2018-10-30 ENCOUNTER — Encounter: Admit: 2018-10-30 | Discharge: 2018-10-30 | Payer: MEDICARE

## 2018-10-30 ENCOUNTER — Ambulatory Visit: Admit: 2018-10-30 | Discharge: 2018-10-31 | Payer: MEDICARE

## 2018-10-30 DIAGNOSIS — G4730 Sleep apnea, unspecified: Secondary | ICD-10-CM

## 2018-10-30 DIAGNOSIS — Z87898 Personal history of other specified conditions: Secondary | ICD-10-CM

## 2018-10-30 DIAGNOSIS — M549 Dorsalgia, unspecified: Secondary | ICD-10-CM

## 2018-10-30 DIAGNOSIS — M109 Gout, unspecified: Secondary | ICD-10-CM

## 2018-10-30 DIAGNOSIS — K929 Disease of digestive system, unspecified: Secondary | ICD-10-CM

## 2018-10-30 DIAGNOSIS — G629 Polyneuropathy, unspecified: Secondary | ICD-10-CM

## 2018-10-30 DIAGNOSIS — N651 Disproportion of reconstructed breast: Secondary | ICD-10-CM

## 2018-10-30 DIAGNOSIS — J45909 Unspecified asthma, uncomplicated: Secondary | ICD-10-CM

## 2018-10-30 DIAGNOSIS — C50919 Malignant neoplasm of unspecified site of unspecified female breast: Secondary | ICD-10-CM

## 2018-10-30 DIAGNOSIS — M1990 Unspecified osteoarthritis, unspecified site: Secondary | ICD-10-CM

## 2018-10-30 DIAGNOSIS — M797 Fibromyalgia: Secondary | ICD-10-CM

## 2018-10-30 DIAGNOSIS — C50411 Malignant neoplasm of upper-outer quadrant of right female breast: Secondary | ICD-10-CM

## 2018-10-30 DIAGNOSIS — H9190 Unspecified hearing loss, unspecified ear: Secondary | ICD-10-CM

## 2018-10-30 NOTE — Progress Notes
Subjective:       History of Present Illness  Hailey Stewart is a 68 y.o. female. Patient returns to clinic for follow-up appointment. Patient states that her left breast still appears to be more full than the right breast. Patient states she prefers the appearance of her left breast. Patient notes some superior hollowing of the right breast. Patient states she can tell a difference between the breasts in a bra and clothing. Patient states that overall she is feeling well. Body mass index is 19.17 kg/m?Marland Kitchen  Allergies   Allergen Reactions   ? Garamycin [Gentamicin] ANAPHYLAXIS        Review of Systems   Constitutional: Negative.    HENT: Negative.    Eyes: Negative.    Respiratory: Negative.    Cardiovascular: Negative.    Gastrointestinal: Negative.    Endocrine: Negative.    Genitourinary: Negative.    Musculoskeletal: Negative.    Skin: Negative.    Allergic/Immunologic: Negative.    Neurological: Negative.    Hematological: Negative.    Psychiatric/Behavioral: Negative.      Objective:         ? acetaminophen (TYLENOL) 325 mg tablet Take two tablets by mouth every 6 hours. Take q6 hours scheduled for first 3 day, then PRN after   ? allopurinol (ZYLOPRIM) 100 mg tablet Take 100 mg by mouth daily. Take with food.   ? anastrozole (ARIMIDEX) 1 mg tablet Take one tablet by mouth daily. Indications: hormone receptor positive breast cancer   ? ascorbic acid (vitamin C) (VITAMIN-C) 250 mg tablet Take 250 mg by mouth daily.   ? biotin 10,000 mcg TbDi Dissolve  by mouth daily.   ? calcium carb/vit D2/minerals (CALTRATE PLUS PO) Take  by mouth.   ? Cyanocobalamin (VITAMIN B-12) 500 mcg lozg Place 500 mcg under tongue daily.   ? diazePAM (VALIUM) 5 mg tablet Take one tablet by mouth every 6 hours as needed for Anxiety.   ? DOCUSATE SODIUM PO Take  by mouth.   ? Famotidine-Ca Carb-Mag Hydrox (PEPCID COMPLETE) 10-800-165 mg chew Chew 1 tablet by mouth twice daily. ? gabapentin (NEURONTIN) 300 mg capsule Take 300 mg by mouth twice daily.   ? loperamide (IMODIUM A-D) 2 mg capsule Take 2 capsules by mouth initially, followed by 1 capsule by mouth after each loose stool up to a maximum of 8 tablets in 24 hours.   ? other medication Take 1 each by mouth daily. Bariatric Pal Multivitamin 1 daily  Bariatric Multivitamin + Minerals to include daily (minimum): Folic Acid (Folate) 400 mcg, Thiamine 12 mg, Vitamin A 5000 units, Vitamin E 15 IU, Vitamin K 90 mcg Copper 2 mg, and Zinc 11mg    ? pantoprazole DR (PROTONIX) 40 mg tablet Take 40 mg by mouth twice daily.   ? senna (SENOKOT) 8.6 mg tablet Take one tablet by mouth twice daily. Please take while on narcotic pain medication. Please hold for loose stools.   ? senna/docusate (SENOKOT-S) 8.6/50 mg tablet Take one tablet by mouth daily. (Patient taking differently: Take 1 tablet by mouth twice daily.)   ? simvastatin (ZOCOR) 20 mg tablet Take 20 mg by mouth at bedtime daily.   ? sucralfate (CARAFATE) 1 gram tablet Take one tablet by mouth three times daily. Take on an empty stomach.   ? turmeric root extract 538 mg cap Take  by mouth daily.   ? vitamins, B complex tab Take 1 tablet by mouth daily.     Vitals:  10/30/18 1304   PainSc: Zero     Physical Exam  Vitals signs reviewed.   Constitutional:       Appearance: She is well-groomed.   HENT:      Head: Normocephalic.   Pulmonary:      Effort: Pulmonary effort is normal.   Chest:       Abdominal:      Palpations: Abdomen is soft.       Skin:     General: Skin is warm and dry.      Findings: No bruising or erythema.   Neurological:      Mental Status: She is alert.   Psychiatric:         Mood and Affect: Mood normal.         Behavior: Behavior normal. Behavior is cooperative.         Thought Content: Thought content normal.         Judgment: Judgment normal.          Assessment and Plan:  68 year old female. Hx of right breast cancer. SSM with bilateral implant placement. Coordinate OR date for exchange of right breast implant.   Patient may need a device with higher projection on the right to achieve symmetry between the breasts.  Patient aware revision will likely be early 2021.     RTC for preoperative appointment

## 2018-11-24 ENCOUNTER — Encounter: Admit: 2018-11-24 | Discharge: 2018-11-24 | Payer: MEDICARE

## 2018-11-24 ENCOUNTER — Ambulatory Visit: Admit: 2018-11-24 | Discharge: 2018-11-24 | Payer: MEDICARE

## 2018-11-24 DIAGNOSIS — C50411 Malignant neoplasm of upper-outer quadrant of right female breast: Secondary | ICD-10-CM

## 2018-11-24 DIAGNOSIS — Z20828 Contact with and (suspected) exposure to other viral communicable diseases: Secondary | ICD-10-CM

## 2018-12-09 ENCOUNTER — Encounter: Admit: 2018-12-09 | Discharge: 2018-12-09 | Payer: MEDICARE

## 2018-12-09 DIAGNOSIS — K929 Disease of digestive system, unspecified: Secondary | ICD-10-CM

## 2018-12-09 DIAGNOSIS — Z87898 Personal history of other specified conditions: Secondary | ICD-10-CM

## 2018-12-09 DIAGNOSIS — G4730 Sleep apnea, unspecified: Secondary | ICD-10-CM

## 2018-12-09 DIAGNOSIS — M797 Fibromyalgia: Secondary | ICD-10-CM

## 2018-12-09 DIAGNOSIS — M1990 Unspecified osteoarthritis, unspecified site: Secondary | ICD-10-CM

## 2018-12-09 DIAGNOSIS — M549 Dorsalgia, unspecified: Secondary | ICD-10-CM

## 2018-12-09 DIAGNOSIS — C50919 Malignant neoplasm of unspecified site of unspecified female breast: Secondary | ICD-10-CM

## 2018-12-09 DIAGNOSIS — M109 Gout, unspecified: Secondary | ICD-10-CM

## 2018-12-09 DIAGNOSIS — G629 Polyneuropathy, unspecified: Secondary | ICD-10-CM

## 2018-12-09 DIAGNOSIS — H9190 Unspecified hearing loss, unspecified ear: Secondary | ICD-10-CM

## 2018-12-09 DIAGNOSIS — J45909 Unspecified asthma, uncomplicated: Secondary | ICD-10-CM

## 2018-12-11 ENCOUNTER — Encounter: Admit: 2018-12-11 | Discharge: 2018-12-11 | Payer: MEDICARE

## 2018-12-11 ENCOUNTER — Ambulatory Visit: Admit: 2018-12-11 | Discharge: 2018-12-12 | Payer: MEDICARE

## 2018-12-11 DIAGNOSIS — J45909 Unspecified asthma, uncomplicated: Secondary | ICD-10-CM

## 2018-12-11 DIAGNOSIS — G629 Polyneuropathy, unspecified: Secondary | ICD-10-CM

## 2018-12-11 DIAGNOSIS — G4730 Sleep apnea, unspecified: Secondary | ICD-10-CM

## 2018-12-11 DIAGNOSIS — M797 Fibromyalgia: Secondary | ICD-10-CM

## 2018-12-11 DIAGNOSIS — M1990 Unspecified osteoarthritis, unspecified site: Secondary | ICD-10-CM

## 2018-12-11 DIAGNOSIS — Z87898 Personal history of other specified conditions: Secondary | ICD-10-CM

## 2018-12-11 DIAGNOSIS — H9190 Unspecified hearing loss, unspecified ear: Secondary | ICD-10-CM

## 2018-12-11 DIAGNOSIS — K929 Disease of digestive system, unspecified: Secondary | ICD-10-CM

## 2018-12-11 DIAGNOSIS — M109 Gout, unspecified: Secondary | ICD-10-CM

## 2018-12-11 DIAGNOSIS — M549 Dorsalgia, unspecified: Secondary | ICD-10-CM

## 2018-12-11 DIAGNOSIS — C50919 Malignant neoplasm of unspecified site of unspecified female breast: Secondary | ICD-10-CM

## 2018-12-20 ENCOUNTER — Encounter: Admit: 2018-12-20 | Discharge: 2018-12-21 | Payer: MEDICARE

## 2018-12-20 DIAGNOSIS — Z20828 Contact with and (suspected) exposure to other viral communicable diseases: Secondary | ICD-10-CM

## 2018-12-22 ENCOUNTER — Encounter: Admit: 2018-12-22 | Discharge: 2018-12-22 | Payer: MEDICARE

## 2018-12-22 DIAGNOSIS — M549 Dorsalgia, unspecified: Secondary | ICD-10-CM

## 2018-12-22 DIAGNOSIS — J45909 Unspecified asthma, uncomplicated: Secondary | ICD-10-CM

## 2018-12-22 DIAGNOSIS — C50919 Malignant neoplasm of unspecified site of unspecified female breast: Secondary | ICD-10-CM

## 2018-12-22 DIAGNOSIS — M109 Gout, unspecified: Secondary | ICD-10-CM

## 2018-12-22 DIAGNOSIS — H9190 Unspecified hearing loss, unspecified ear: Secondary | ICD-10-CM

## 2018-12-22 DIAGNOSIS — M797 Fibromyalgia: Secondary | ICD-10-CM

## 2018-12-22 DIAGNOSIS — Z87898 Personal history of other specified conditions: Secondary | ICD-10-CM

## 2018-12-22 DIAGNOSIS — G4730 Sleep apnea, unspecified: Secondary | ICD-10-CM

## 2018-12-22 DIAGNOSIS — G629 Polyneuropathy, unspecified: Secondary | ICD-10-CM

## 2018-12-22 DIAGNOSIS — K929 Disease of digestive system, unspecified: Secondary | ICD-10-CM

## 2018-12-22 DIAGNOSIS — M1990 Unspecified osteoarthritis, unspecified site: Secondary | ICD-10-CM

## 2018-12-22 NOTE — Telephone Encounter
Contacted patient and confirmed name and DOB. Patient advised that COVID-19 test results are negative. Advised that patient can continue with the procedure and should follow pre-procedure instructions. Advised patient to continue with home quarantine until procedure. Advised that if they develop any concerning symptoms prior to the procedure to contact their procedure team, specialist, and/or PCP for assistance.

## 2018-12-23 ENCOUNTER — Encounter: Admit: 2018-12-23 | Discharge: 2018-12-23 | Payer: MEDICARE

## 2018-12-23 ENCOUNTER — Ambulatory Visit: Admit: 2018-12-23 | Discharge: 2018-12-23 | Payer: MEDICARE

## 2018-12-23 DIAGNOSIS — M1990 Unspecified osteoarthritis, unspecified site: Secondary | ICD-10-CM

## 2018-12-23 DIAGNOSIS — G4730 Sleep apnea, unspecified: Secondary | ICD-10-CM

## 2018-12-23 DIAGNOSIS — M797 Fibromyalgia: Secondary | ICD-10-CM

## 2018-12-23 DIAGNOSIS — K929 Disease of digestive system, unspecified: Secondary | ICD-10-CM

## 2018-12-23 DIAGNOSIS — J45909 Unspecified asthma, uncomplicated: Secondary | ICD-10-CM

## 2018-12-23 DIAGNOSIS — M109 Gout, unspecified: Secondary | ICD-10-CM

## 2018-12-23 DIAGNOSIS — M549 Dorsalgia, unspecified: Secondary | ICD-10-CM

## 2018-12-23 DIAGNOSIS — C50919 Malignant neoplasm of unspecified site of unspecified female breast: Secondary | ICD-10-CM

## 2018-12-23 DIAGNOSIS — Z87898 Personal history of other specified conditions: Secondary | ICD-10-CM

## 2018-12-23 DIAGNOSIS — H9190 Unspecified hearing loss, unspecified ear: Secondary | ICD-10-CM

## 2018-12-23 DIAGNOSIS — G629 Polyneuropathy, unspecified: Secondary | ICD-10-CM

## 2018-12-23 MED ORDER — PHENYLEPHRINE IN 0.9% NACL(PF) 1 MG/10 ML (100 MCG/ML) IV SYRG
INTRAVENOUS | 0 refills | Status: DC
Start: 2018-12-23 — End: 2018-12-23
  Administered 2018-12-23: 19:00:00 100 ug via INTRAVENOUS
  Administered 2018-12-23: 20:00:00 50 ug via INTRAVENOUS
  Administered 2018-12-23: 18:00:00 200 ug via INTRAVENOUS

## 2018-12-23 MED ORDER — ACETAMINOPHEN 500 MG PO TAB
1000 mg | Freq: Once | ORAL | 0 refills | Status: CP
Start: 2018-12-23 — End: ?
  Administered 2018-12-23: 18:00:00 1000 mg via ORAL

## 2018-12-23 MED ORDER — OXYCODONE 5 MG PO TAB
5-10 mg | Freq: Once | ORAL | 0 refills | Status: CP | PRN
Start: 2018-12-23 — End: ?
  Administered 2018-12-23: 22:00:00 10 mg via ORAL

## 2018-12-23 MED ORDER — SUCCINYLCHOLINE CHLORIDE 20 MG/ML IJ SOLN
INTRAVENOUS | 0 refills | Status: DC
Start: 2018-12-23 — End: 2018-12-23
  Administered 2018-12-23: 18:00:00 80 mg via INTRAVENOUS

## 2018-12-23 MED ORDER — EPHEDRINE SULFATE 50 MG/5ML SYR (10 MG/ML) (AN)(OSM)
0 refills | Status: DC
Start: 2018-12-23 — End: 2018-12-23
  Administered 2018-12-23 (×2): 10 mg via INTRAVENOUS

## 2018-12-23 MED ORDER — SCOPOLAMINE BASE 1 MG OVER 3 DAYS TD PT3D
1 | Freq: Once | TRANSDERMAL | 0 refills | Status: DC
Start: 2018-12-23 — End: 2018-12-24
  Administered 2018-12-23: 18:00:00 1 via TRANSDERMAL

## 2018-12-23 MED ORDER — DEXTRAN 70-HYPROMELLOSE (PF) 0.1-0.3 % OP DPET
0 refills | Status: DC
Start: 2018-12-23 — End: 2018-12-23
  Administered 2018-12-23: 18:00:00 2 [drp] via OPHTHALMIC

## 2018-12-23 MED ORDER — HYDROCODONE-ACETAMINOPHEN 5-325 MG PO TAB
1-2 | ORAL_TABLET | ORAL | 0 refills | 30.00000 days | Status: DC | PRN
Start: 2018-12-23 — End: 2019-04-01

## 2018-12-23 MED ORDER — CEFAZOLIN 1 GRAM IJ SOLR
0 refills | Status: DC
Start: 2018-12-23 — End: 2018-12-23
  Administered 2018-12-23: 18:00:00 2 g via INTRAVENOUS

## 2018-12-23 MED ORDER — FENTANYL CITRATE (PF) 50 MCG/ML IJ SOLN
0 refills | Status: DC
Start: 2018-12-23 — End: 2018-12-23
  Administered 2018-12-23: 20:00:00 50 ug via INTRAVENOUS
  Administered 2018-12-23: 19:00:00 25 ug via INTRAVENOUS
  Administered 2018-12-23: 18:00:00 50 ug via INTRAVENOUS

## 2018-12-23 MED ORDER — LACTATED RINGERS IV SOLP
1000 mL | INTRAVENOUS | 0 refills | Status: DC
Start: 2018-12-23 — End: 2018-12-24

## 2018-12-23 MED ORDER — PATCH DOCUMENTATION - SCOPOLAMINE BASE 1 MG/72HR
1 | Freq: Two times a day (BID) | TRANSDERMAL | 0 refills | Status: DC
Start: 2018-12-23 — End: 2018-12-24

## 2018-12-23 MED ORDER — HALOPERIDOL LACTATE 5 MG/ML IJ SOLN
1 mg | Freq: Once | INTRAVENOUS | 0 refills | Status: DC | PRN
Start: 2018-12-23 — End: 2018-12-24

## 2018-12-23 MED ORDER — FENTANYL CITRATE (PF) 50 MCG/ML IJ SOLN
25-50 ug | INTRAVENOUS | 0 refills | Status: DC | PRN
Start: 2018-12-23 — End: 2018-12-24
  Administered 2018-12-23: 22:00:00 50 ug via INTRAVENOUS

## 2018-12-23 MED ORDER — DOXYCYCLINE HYCLATE 100 MG PO TAB
100 mg | ORAL_TABLET | Freq: Two times a day (BID) | ORAL | 0 refills | 8.00000 days | Status: AC
Start: 2018-12-23 — End: ?

## 2018-12-23 MED ORDER — MEPERIDINE (PF) 25 MG/ML IJ SYRG
12.5 mg | INTRAVENOUS | 0 refills | Status: DC | PRN
Start: 2018-12-23 — End: 2018-12-24

## 2018-12-23 MED ORDER — ONDANSETRON HCL (PF) 4 MG/2 ML IJ SOLN
INTRAVENOUS | 0 refills | Status: DC
Start: 2018-12-23 — End: 2018-12-23
  Administered 2018-12-23: 20:00:00 4 mg via INTRAVENOUS

## 2018-12-23 MED ORDER — PROPOFOL INJ 10 MG/ML IV VIAL
0 refills | Status: DC
Start: 2018-12-23 — End: 2018-12-23
  Administered 2018-12-23: 18:00:00 100 mg via INTRAVENOUS

## 2018-12-23 MED ORDER — LIDOCAINE (PF) 200 MG/10 ML (2 %) IJ SYRG
0 refills | Status: DC
Start: 2018-12-23 — End: 2018-12-23
  Administered 2018-12-23: 18:00:00 60 mg via INTRAVENOUS

## 2018-12-23 MED ORDER — ONDANSETRON HCL (PF) 4 MG/2 ML IJ SOLN
4 mg | Freq: Once | INTRAVENOUS | 0 refills | Status: CP
Start: 2018-12-23 — End: ?
  Administered 2018-12-23: 18:00:00 4 mg via INTRAVENOUS

## 2018-12-23 MED ORDER — DEXAMETHASONE SODIUM PHOSPHATE 4 MG/ML IJ SOLN
INTRAVENOUS | 0 refills | Status: DC
Start: 2018-12-23 — End: 2018-12-23
  Administered 2018-12-23: 18:00:00 4 mg via INTRAVENOUS

## 2018-12-29 ENCOUNTER — Encounter: Admit: 2018-12-29 | Discharge: 2018-12-29 | Payer: MEDICARE

## 2018-12-29 ENCOUNTER — Ambulatory Visit: Admit: 2018-12-29 | Discharge: 2018-12-30 | Payer: MEDICARE

## 2018-12-29 DIAGNOSIS — H9190 Unspecified hearing loss, unspecified ear: Secondary | ICD-10-CM

## 2018-12-29 DIAGNOSIS — M549 Dorsalgia, unspecified: Secondary | ICD-10-CM

## 2018-12-29 DIAGNOSIS — Z87898 Personal history of other specified conditions: Secondary | ICD-10-CM

## 2018-12-29 DIAGNOSIS — J45909 Unspecified asthma, uncomplicated: Secondary | ICD-10-CM

## 2018-12-29 DIAGNOSIS — C50919 Malignant neoplasm of unspecified site of unspecified female breast: Secondary | ICD-10-CM

## 2018-12-29 DIAGNOSIS — C50411 Malignant neoplasm of upper-outer quadrant of right female breast: Secondary | ICD-10-CM

## 2018-12-29 DIAGNOSIS — N651 Disproportion of reconstructed breast: Secondary | ICD-10-CM

## 2018-12-29 DIAGNOSIS — G4730 Sleep apnea, unspecified: Secondary | ICD-10-CM

## 2018-12-29 DIAGNOSIS — K929 Disease of digestive system, unspecified: Secondary | ICD-10-CM

## 2018-12-29 DIAGNOSIS — G629 Polyneuropathy, unspecified: Secondary | ICD-10-CM

## 2018-12-29 DIAGNOSIS — M1990 Unspecified osteoarthritis, unspecified site: Secondary | ICD-10-CM

## 2018-12-29 DIAGNOSIS — M797 Fibromyalgia: Secondary | ICD-10-CM

## 2018-12-29 DIAGNOSIS — M109 Gout, unspecified: Secondary | ICD-10-CM

## 2018-12-29 NOTE — Progress Notes
Date of Service: 12/29/2018    Subjective:             Hailey Stewart is a 68 y.o. female.    History of Present Illness  Patient returns to clinic for follow-up. Patient states overall she is doing well. Patient states her left breast is more painful/tender than the right breast. Patient states she feels her right breast feels lower than her left. No other concerns at this time. Body mass index is 19.39 kg/m?Marland Kitchen  Allergies   Allergen Reactions   ? Garamycin [Gentamicin] ANAPHYLAXIS        Review of Systems   Constitutional: Negative.    HENT: Negative.    Eyes: Negative.    Respiratory: Negative.    Cardiovascular: Negative.    Gastrointestinal: Negative.    Endocrine: Negative.    Genitourinary: Negative.    Musculoskeletal: Negative.    Skin: Negative.    Allergic/Immunologic: Negative.    Neurological: Negative.    Hematological: Negative.    Psychiatric/Behavioral: Negative.          Objective:         ? acetaminophen (TYLENOL) 325 mg tablet Take two tablets by mouth every 6 hours. Take q6 hours scheduled for first 3 day, then PRN after   ? allopurinol (ZYLOPRIM) 100 mg tablet Take 100 mg by mouth daily. Take with food.   ? anastrozole (ARIMIDEX) 1 mg tablet Take one tablet by mouth daily. Indications: hormone receptor positive breast cancer   ? ascorbic acid (vitamin C) (VITAMIN-C) 250 mg tablet Take 250 mg by mouth daily.   ? biotin 10,000 mcg TbDi Dissolve  by mouth daily.   ? calcium carb/vit D2/minerals (CALTRATE PLUS PO) Take  by mouth.   ? Cyanocobalamin (VITAMIN B-12) 500 mcg lozg Place 500 mcg under tongue daily.   ? diazePAM (VALIUM) 5 mg tablet Take one tablet by mouth every 6 hours as needed for Anxiety.   ? DOCUSATE SODIUM PO Take  by mouth.   ? doxycycline (VIBRAMYCIN) 100 mg tablet Take one tablet by mouth twice daily for 7 days.   ? Famotidine-Ca Carb-Mag Hydrox (PEPCID COMPLETE) 10-800-165 mg chew Chew 1 tablet by mouth twice daily. ? gabapentin (NEURONTIN) 300 mg capsule Take 300 mg by mouth twice daily.   ? HYDROcodone/acetaminophen (NORCO) 5/325 mg tablet Take one tablet to two tablets by mouth every 4 hours as needed for Pain   ? loperamide (IMODIUM A-D) 2 mg capsule Take 2 capsules by mouth initially, followed by 1 capsule by mouth after each loose stool up to a maximum of 8 tablets in 24 hours.   ? other medication Take 1 each by mouth daily. Bariatric Pal Multivitamin 1 daily  Bariatric Multivitamin + Minerals to include daily (minimum): Folic Acid (Folate) 400 mcg, Thiamine 12 mg, Vitamin A 5000 units, Vitamin E 15 IU, Vitamin K 90 mcg Copper 2 mg, and Zinc 11mg    ? pantoprazole DR (PROTONIX) 40 mg tablet Take 40 mg by mouth twice daily.   ? senna (SENOKOT) 8.6 mg tablet Take one tablet by mouth twice daily. Please take while on narcotic pain medication. Please hold for loose stools.   ? senna/docusate (SENOKOT-S) 8.6/50 mg tablet Take one tablet by mouth daily. (Patient taking differently: Take 1 tablet by mouth twice daily.)   ? simvastatin (ZOCOR) 20 mg tablet Take 20 mg by mouth at bedtime daily.   ? sucralfate (CARAFATE) 1 gram tablet Take one tablet by mouth three times  daily. Take on an empty stomach.   ? turmeric root extract 538 mg cap Take  by mouth daily.   ? vitamins, B complex tab Take 1 tablet by mouth daily.     Vitals:    12/29/18 1329   BP: 115/73   BP Source: Arm, Left Upper   Patient Position: Sitting   Pulse: 63   Temp: 36.7 ?C (98 ?F)   Weight: 48.1 kg (106 lb)   Height: 157.5 cm (62)   PainSc: Four     Physical Exam  Vitals signs reviewed.   Constitutional:       Appearance: Normal appearance. She is well-groomed.   HENT:      Head: Normocephalic.   Pulmonary:      Effort: Pulmonary effort is normal.   Chest:          Comments: Mild ecchymosis of left breast.  Skin:     General: Skin is warm and dry.   Neurological:      Mental Status: She is alert.   Psychiatric:         Mood and Affect: Mood normal. Behavior: Behavior normal. Behavior is cooperative.         Thought Content: Thought content normal.         Judgment: Judgment normal.          Assessment and Plan:  Patient is s/p bilateral implant exchange.   Advised patient that her breasts are both swollen and will need time to recover.  RTC in one month.

## 2019-01-14 ENCOUNTER — Encounter

## 2019-01-14 DIAGNOSIS — Z87898 Personal history of other specified conditions: Secondary | ICD-10-CM

## 2019-01-14 DIAGNOSIS — R5382 Chronic fatigue, unspecified: Secondary | ICD-10-CM

## 2019-01-14 DIAGNOSIS — R42 Dizziness and giddiness: Secondary | ICD-10-CM

## 2019-01-14 DIAGNOSIS — C50411 Malignant neoplasm of upper-outer quadrant of right female breast: Secondary | ICD-10-CM

## 2019-01-14 MED ORDER — DENOSUMAB 60 MG/ML SC SYRG
60 mg | Freq: Once | SUBCUTANEOUS | 0 refills | Status: CN
Start: 2019-01-14 — End: ?

## 2019-01-22 NOTE — Progress Notes
Name: Hailey Stewart          MRN: [id number]      DOB: May 08, 1950      AGE: 69 y.o.   DATE OF SERVICE: 01/28/2019    Subjective:             Reason for Visit:  Heme/Onc Care    Cancer Staging  Malignant neoplasm of upper-outer quadrant of right breast in female, estrogen receptor positive (HCC)  Staging form: Breast, AJCC 8th Edition  - Clinical stage from 07/18/2017: Stage IB (cT2, cN0, cM0, G2, ER+, PR+, HER2-) - Signed by Andrew Au, PA-C on 07/23/2017  - Pathologic stage from 08/15/2017: Stage IA (pT2(2), pN0(sn), cM0, G2, ER+, PR+, HER2-) - Signed by Andrew Au, PA-C on 08/23/2017    Oncology History    Hailey Stewart is a very pleasant 69 y.o.postmenopausal female who presented 07/24/17  for initial consultation regarding her recently diagnosed right breast cancer. She is accompanied by her family. Reported hot flashes, taking Amberen OTC. Voiced she is taking several supplements since having gastric bypass surgery. Hailey Stewart presented to the [facility] 07/18/2017 for evaluation of right breast mass prior to biopsy.  Hailey Stewart recently had gastric bypass surgery 03/20/17 and after losing 40 lbs she palpated a right breast lump. A suspicious mass was found in the right breast on the screening/tomo bilateral mammogram 07/04/2017 [facility] A right breast ultrasound 07/05/17 [facility]) confirmed a right breast mass. Right diagnostic mammogram 07/18/17 ([place]) revealed an irregular mass and additional suspicious calcifications in the upper outer right breast. Right ultrasound 07/18/17 ([place]) revealed an irregular 3.3 cm right breast mass at 9:30 with suspicious pleomorphic calcifications with additional suspicious calcifications in the upper outer right breast. She underwent a right sono-guided core needle biopsy 07/18/17 ([place]) which revealed IDC, grade II at 9:30. Clinical stage IB; cT2, cN0, cM0. ER 94.7, PR 20.2, Her2 (0 + by IHC) with a Ki67 of 38.77%. Bilateral MRI 07/30/17 demonstrated a right mass and an additional satellite cancer  immediately posterior to the dominant mass. S/p right mastectomy, SLNB with Dr. Loreta Ave and TE with Dr. Fran Lowes on 08/15/17. Pathology revealed multifocal (2 foci) IDC, grade II with DCIS, grade III. Negative lymph nodes (0/3). Pathologic stage IA; pT2(2), pN0(sn), cM0.    Completed AC, Taxol. Taxol stopped early due to neuropathy.     Current Therapy: Arimidex. Prolia.     History of Present Illness    Hailey Stewart returns today for routine f/u. She is taking the Arimidex and overall tolerating well. Denies arthralgias.   No change in neuropathy for which she is following with Dr. Chestine Spore. Pt denies any current breast health issues, specifically denies any breast mass, pain, nipple discharge, skin changes, or rash. She is adhering to social distancing.          Review of Systems Constitutional: Positive for fatigue. Negative for activity change, appetite change, chills, fever and unexpected weight change.   HENT: Positive for tinnitus (chronic). Negative for congestion, dental problem, mouth sores, sore throat and trouble swallowing.    Eyes: Negative for photophobia, itching and visual disturbance.   Respiratory: Positive for apnea (c-pap). Negative for cough, chest tightness, shortness of breath and wheezing.    Cardiovascular: Negative for chest pain, palpitations and leg swelling.   Gastrointestinal: Negative for abdominal distention, abdominal pain, blood in stool, constipation, diarrhea, nausea and vomiting.   Genitourinary: Negative for decreased urine volume, difficulty urinating, dysuria, frequency and urgency.  Musculoskeletal: Positive for arthralgias (baseline, no concerns voiced today), myalgias (baseline) and neck pain (baseline). Negative for back pain (baseline), gait problem, joint swelling and neck stiffness (baseline, no concerns voiced today).   Skin: Negative for rash.   Neurological: Positive for numbness and headaches. Negative for dizziness, weakness and light-headedness.   Hematological: Negative for adenopathy. Does not bruise/bleed easily.   Psychiatric/Behavioral: Negative for sleep disturbance. The patient is not nervous/anxious.      Medical History:   Diagnosis Date   ? Arthritis    ? Asthma     as a child   ? Back pain    ? Breast cancer (HCC)    ? Fibromyalgia    ? Gastrointestinal disorder     GERD   ? Gout    ? Hearing reduced    ? History of palpitations    ? Neuropathy    ? Sleep apnea     wears CPAP machine     Surgical History:   Procedure Laterality Date   ? TONSILLECTOMY  1964   ? BILATERAL SALPINGO-OOPHORECTOMY  1982   ? HX HYSTERECTOMY  1992   ? HX APPENDECTOMY  1992   ? HX CHOLECYSTECTOMY  2014   ? HX CATARACT REMOVAL Bilateral 2014   ? HX ACL RECONSTRUCTION Left 2015   ? FOOT SURGERY Left 2017   ? GASTRIC BYPASS  03/26/2017 ? RIGHT SKIN SPARING MASTECTOMY Right 08/15/2017    Performed by Ruthy Dick, DO at [facility] OR   ? IDENTIFICATION SENTINEL LYMPH NODE Right 08/15/2017    Performed by Ruthy Dick, DO at [facility] OR   ? INJECTION RADIOACTIVE TRACER FOR SENTINEL NODE IDENTIFICATION Right 08/15/2017    Performed by Ruthy Dick, DO at [facility] OR   ? SENTINEL LYMPH NODE BIOPSY Right 08/15/2017    Performed by Ruthy Dick, DO at [facility] OR   ? RECONSTRUCTION BREAST WITH TISSUE EXPANDER AND SUBSEQUENT EXPANSION - IMMEDIATE/ DELAYED Right 08/15/2017    Performed by Marlinda Mike, MD at [facility] OR   ? IMPLANTATION BIOLOGIC IMPLANT FOR SOFT TISSUE REINFORCEMENT Right 08/15/2017    Performed by Marlinda Mike, MD at [facility] OR   ? PORT PLACEMENT Left 09/20/2017    Performed by Geralyn Flash, MD at [facility] OR   ? RIGHT TISSUE EXPANDER EXCHANGE Right 10/10/2017    Performed by Marlinda Mike, MD at [facility]   ? REPLACEMENT TISSUE EXPANDER WITH PERMANENT PROSTHESIS Right 05/27/2018    Performed by Marlinda Mike, MD at [facility] OR   ? INSERTION BREAST PROSTHESIS FOLLOWING MASTOPEXY/ MASTECTOMY/ IN RECONSTRUCTION - IMMEDIATE Left 05/27/2018    Performed by Marlinda Mike, MD at [facility] OR   ? REVISION RECONSTRUCTED BREAST Right 05/27/2018    Performed by Marlinda Mike, MD at [facility] OR   ? INSERTION BREAST PROSTHESIS FOLLOWING MASTOPEXY/ MASTECTOMY/ IN RECONSTRUCTION - IMMEDIATE Bilateral 09/24/2018    Performed by Marlinda Mike, MD at [facility] OR   ? FAT GRAFTING TO RIGHT BREAST S - 50 CC OR LESS - reduced service.  3.17ml harvested Right 09/24/2018    Performed by Marlinda Mike, MD at [facility] OR   ? REVISION RECONSTRUCTED BREAST Left 09/24/2018    Performed by Marlinda Mike, MD at [facility] OR   ? REMOVAL TUNNELED CENTRAL VENOUS ACCESS DEVICE INCLUDING PORT/ PUMP Left 09/24/2018    Performed by Marlinda Mike, MD at [facility] OR   ? INSERTION BREAST PROSTHESIS FOLLOWING MASTOPEXY/ MASTECTOMY/ IN  RECONSTRUCTION - DELAYED Right 12/23/2018    Performed by Marlinda Mike, MD at [facility] OR   ? REVISION RECONSTRUCTED BREAST Left 12/23/2018 Performed by Marlinda Mike, MD at [facility] OR   ? WRIST SURGERY Left 2000's     Family History   Problem Relation Age of Onset   ? Diabetes Mother    ? Arthritis-rheumatoid Mother    ? Depression Mother    ? Arthritis-rheumatoid Father    ? Diabetes Maternal Grandmother    ? Heart Disease Maternal Grandmother      Social History     Socioeconomic History   ? Marital status: Married     Spouse name: Not on file   ? Number of children: Not on file   ? Years of education: Not on file   ? Highest education level: Not on file   Occupational History   ? Not on file   Tobacco Use   ? Smoking status: Never Smoker   ? Smokeless tobacco: Never Used   Substance and Sexual Activity   ? Alcohol use: Yes     Alcohol/week: 1.0 standard drinks     Types: 1 Glasses of wine per week     Frequency: Monthly or less     Binge frequency: Less than monthly     Comment: rare   ? Drug use: Never   ? Sexual activity: Not Currently   Other Topics Concern   ? Not on file   Social History Narrative   ? Not on file     Objective:         ? acetaminophen (TYLENOL) 325 mg tablet Take two tablets by mouth every 6 hours. Take q6 hours scheduled for first 3 day, then PRN after   ? allopurinol (ZYLOPRIM) 100 mg tablet Take 100 mg by mouth daily. Take with food.   ? anastrozole (ARIMIDEX) 1 mg tablet Take one tablet by mouth daily. Indications: hormone receptor positive breast cancer   ? ascorbic acid (vitamin C) (VITAMIN-C) 250 mg tablet Take 250 mg by mouth daily.   ? biotin 10,000 mcg TbDi Dissolve  by mouth daily.   ? calcium carb/vit D2/minerals (CALTRATE PLUS PO) Take  by mouth.   ? Cyanocobalamin (VITAMIN B-12) 500 mcg lozg Place 500 mcg under tongue daily.   ? diazePAM (VALIUM) 5 mg tablet Take one tablet by mouth every 6 hours as needed for Anxiety.   ? DOCUSATE SODIUM PO Take  by mouth.   ? Famotidine-Ca Carb-Mag Hydrox (PEPCID COMPLETE) 10-800-165 mg chew Chew 1 tablet by mouth twice daily. ? gabapentin (NEURONTIN) 300 mg capsule Take 300 mg by mouth twice daily.   ? HYDROcodone/acetaminophen (NORCO) 5/325 mg tablet Take one tablet to two tablets by mouth every 4 hours as needed for Pain   ? loperamide (IMODIUM A-D) 2 mg capsule Take 2 capsules by mouth initially, followed by 1 capsule by mouth after each loose stool up to a maximum of 8 tablets in 24 hours.   ? other medication Take 1 each by mouth daily. Bariatric Pal Multivitamin 1 daily  Bariatric Multivitamin + Minerals to include daily (minimum): Folic Acid (Folate) 400 mcg, Thiamine 12 mg, Vitamin A 5000 units, Vitamin E 15 IU, Vitamin K 90 mcg Copper 2 mg, and Zinc 11mg    ? pantoprazole DR (PROTONIX) 40 mg tablet Take 40 mg by mouth twice daily.   ? senna (SENOKOT) 8.6 mg tablet Take one tablet by mouth twice daily. Please take while  on narcotic pain medication. Please hold for loose stools.   ? senna/docusate (SENOKOT-S) 8.6/50 mg tablet Take one tablet by mouth daily. (Patient taking differently: Take 1 tablet by mouth twice daily.)   ? simvastatin (ZOCOR) 20 mg tablet Take 20 mg by mouth at bedtime daily.   ? sucralfate (CARAFATE) 1 gram tablet Take one tablet by mouth three times daily. Take on an empty stomach.   ? turmeric root extract 538 mg cap Take  by mouth daily.   ? vitamins, B complex tab Take 1 tablet by mouth daily.     Vitals:    01/28/19 1036 01/28/19 1039   BP: 117/64    BP Source: Arm, Left Upper    Patient Position: Sitting    Pulse: 66    Resp: 18    Temp: 36.8 ?C (98.3 ?F)    TempSrc: Oral    SpO2: 100%    Weight: 48.1 kg (106 lb)    Height: 157.5 cm (62.01)    PainSc:  Zero     Body mass index is 19.38 kg/m?Marland Kitchen     Pain Score: Zero       Fatigue Scale: 0-None    Pain Addressed:  N/A    Patient Evaluated for a Clinical Trial: No treatment clinical trial available for this patient. [place] Cooperative Oncology Group performance status is 1, Restricted in physically strenuous activity but ambulatory and able to carry out work of a light or sedentary nature, e.g., light house work, office work.     Physical Exam  Vitals signs reviewed.   Constitutional:       General: She is not in acute distress.     Appearance: Normal appearance. She is well-developed and normal weight. She is not diaphoretic.   HENT:      Head: Normocephalic and atraumatic.      Mouth/Throat:      Mouth: Mucous membranes are moist. No oral lesions.      Pharynx: No oropharyngeal exudate.   Eyes:      General: No scleral icterus.     Conjunctiva/sclera: Conjunctivae normal.      Pupils: Pupils are equal, round, and reactive to light.   Neck:      Musculoskeletal: Normal range of motion and neck supple.   Cardiovascular:      Rate and Rhythm: Regular rhythm.   Pulmonary:      Effort: Pulmonary effort is normal. No respiratory distress.      Breath sounds: Normal breath sounds.   Chest:      Breasts:         Right: No bleeding, mass, skin change or tenderness.         Left: No bleeding, inverted nipple, mass, nipple discharge, skin change or tenderness.       Abdominal:      General: There is no distension.      Palpations: Abdomen is soft.      Tenderness: There is no abdominal tenderness. There is no guarding.   Musculoskeletal: Normal range of motion.         General: No swelling.   Lymphadenopathy:      Cervical: No cervical adenopathy.      Upper Body:      Right upper body: No supraclavicular or axillary adenopathy.      Left upper body: No supraclavicular or axillary adenopathy.   Skin:     General: Skin is warm and dry.      Coloration: Skin is  not jaundiced or pale.   Neurological:      Mental Status: She is alert and oriented to person, place, and time.   Psychiatric:         Behavior: Behavior normal.         Thought Content: Thought content normal.         Judgment: Judgment normal.          ECHO 09/20/17 LVEF=67% BONE DENSITY 03/10/2018:    IMPRESSION      Osteoporosis, predominantly involving the right femoral neck.    MRI HEAD WO/W CONTRAST: 07/28/18  IMPRESSION     Unremarkable MRI of the brain for age. No evidence of intracranial   metastatic disease.          Assessment and Plan:  The patient is a 70 y.o.  female with the following:    1.  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   2.  S/p right mastectomy, SLNB with Dr. Loreta Ave and TE with Dr. Fran Lowes on 08/15/17. Pathology revealed multifocal (2 foci) IDC, grade II with DCIS, grade III. Negative lymph nodes (0/3). Pathologic stage IA; pT2(2), pN0(sn), cM0. Oncotype Dx testing which showed a RS of 27.   3.  History of gastric bypass, pt unable to take steroids without a protective regimen of PPI plus carafate.   4.  Nausea, rate 1 chemotherapy-induced.  5.  Dizziness, CT head negative, no current issues.   6.  Back pain, intermittent, no current issues.  7.  Anemia, treatment induced with reported iron deficiency  8.  Fatigue, chemotherapy-induced, grade 1  9.  Neuropathy, grade 2-3, chemotherapy-induced  10.  Osteoporosis.        Hailey Stewart is without evidence of disease at this time.     Continue Arimidex.     Continue calcium and vitamin D, and weightbearing exercise. Continue with Prolia. Next DEXA due March 2021. We will plan repeat DEXA with her return in 3 months.      Mammogram due 05/2019.     Continue F/u with Dr. Fran Lowes.     Continue f/u with Dr. Chestine Spore.     Hailey Stewart has asked insightful questions and all questions were answered.     Hailey Stewart was encouraged to call with any persistent symptoms, questions, or concerns.    RTC in 3 months.     Hailey Combes, APRN-NP

## 2019-01-22 NOTE — Patient Instructions
Thank you for coming to see us today.   Please call our office or send a message through MyChart if you have any questions or concerns.          Dr. Anne O'Dea and Michelle Prager, APRN  [phone] (Nurse Ailea Rhatigan/Michele Kuester)  [phone] (fax)    [facility] Location  [address]  [place], [place] [address]    [facility] Location  [address]  [place],  [address]

## 2019-01-28 ENCOUNTER — Encounter

## 2019-01-28 DIAGNOSIS — M549 Dorsalgia, unspecified: Secondary | ICD-10-CM

## 2019-01-28 DIAGNOSIS — K929 Disease of digestive system, unspecified: Secondary | ICD-10-CM

## 2019-01-28 DIAGNOSIS — J45909 Unspecified asthma, uncomplicated: Secondary | ICD-10-CM

## 2019-01-28 DIAGNOSIS — G629 Polyneuropathy, unspecified: Secondary | ICD-10-CM

## 2019-01-28 DIAGNOSIS — H9190 Unspecified hearing loss, unspecified ear: Secondary | ICD-10-CM

## 2019-01-28 DIAGNOSIS — M818 Other osteoporosis without current pathological fracture: Secondary | ICD-10-CM

## 2019-01-28 DIAGNOSIS — G4730 Sleep apnea, unspecified: Secondary | ICD-10-CM

## 2019-01-28 DIAGNOSIS — C50411 Malignant neoplasm of upper-outer quadrant of right female breast: Secondary | ICD-10-CM

## 2019-01-28 DIAGNOSIS — Z87898 Personal history of other specified conditions: Secondary | ICD-10-CM

## 2019-01-28 DIAGNOSIS — C50919 Malignant neoplasm of unspecified site of unspecified female breast: Secondary | ICD-10-CM

## 2019-01-28 DIAGNOSIS — R5382 Chronic fatigue, unspecified: Secondary | ICD-10-CM

## 2019-01-28 DIAGNOSIS — M797 Fibromyalgia: Secondary | ICD-10-CM

## 2019-01-28 DIAGNOSIS — M1990 Unspecified osteoarthritis, unspecified site: Secondary | ICD-10-CM

## 2019-01-28 DIAGNOSIS — M109 Gout, unspecified: Secondary | ICD-10-CM

## 2019-01-28 DIAGNOSIS — R42 Dizziness and giddiness: Secondary | ICD-10-CM

## 2019-01-28 LAB — TSH WITH FREE T4 REFLEX: Lab: 1.5 uU/mL (ref 0.35–5.00)

## 2019-01-28 MED ORDER — DENOSUMAB 60 MG/ML SC SYRG
60 mg | Freq: Once | SUBCUTANEOUS | 0 refills | Status: CP
Start: 2019-01-28 — End: ?
  Administered 2019-01-28: 18:00:00 60 mg via SUBCUTANEOUS

## 2019-01-28 NOTE — Progress Notes
Patient received Prolia and tolerated without difficulty.  No pertinent changes since last assessment.

## 2019-01-28 NOTE — Telephone Encounter
Patient called nurse line to report about 15-20 minutes after receiving Prolia injection today, she developed a hard knot to her left shoulder (same arm as injection) with painful shooting, localized pain.  Patient denies, cough, SOB, rash, itching, chest pain or any other pain.  Verified with Treatment RN that injection was given successfully.  Instructed patient to take Tylenol, apply ice as needed and to call with any worsening or new symptoms.  Patient states the pain is mild and she does not need to go to the ED but wanted [place] to know.  Patient appreciated the call back.

## 2019-02-02 ENCOUNTER — Ambulatory Visit: Admit: 2019-02-02 | Discharge: 2019-02-03 | Payer: MEDICARE

## 2019-02-02 ENCOUNTER — Encounter: Admit: 2019-02-02 | Discharge: 2019-02-02 | Payer: MEDICARE

## 2019-02-02 DIAGNOSIS — C50919 Malignant neoplasm of unspecified site of unspecified female breast: Secondary | ICD-10-CM

## 2019-02-02 DIAGNOSIS — H9190 Unspecified hearing loss, unspecified ear: Secondary | ICD-10-CM

## 2019-02-02 DIAGNOSIS — M109 Gout, unspecified: Secondary | ICD-10-CM

## 2019-02-02 DIAGNOSIS — Z87898 Personal history of other specified conditions: Secondary | ICD-10-CM

## 2019-02-02 DIAGNOSIS — M549 Dorsalgia, unspecified: Secondary | ICD-10-CM

## 2019-02-02 DIAGNOSIS — G629 Polyneuropathy, unspecified: Secondary | ICD-10-CM

## 2019-02-02 DIAGNOSIS — J45909 Unspecified asthma, uncomplicated: Secondary | ICD-10-CM

## 2019-02-02 DIAGNOSIS — N651 Disproportion of reconstructed breast: Secondary | ICD-10-CM

## 2019-02-02 DIAGNOSIS — C50411 Malignant neoplasm of upper-outer quadrant of right female breast: Secondary | ICD-10-CM

## 2019-02-02 DIAGNOSIS — M1990 Unspecified osteoarthritis, unspecified site: Secondary | ICD-10-CM

## 2019-02-02 DIAGNOSIS — M797 Fibromyalgia: Secondary | ICD-10-CM

## 2019-02-02 DIAGNOSIS — K929 Disease of digestive system, unspecified: Secondary | ICD-10-CM

## 2019-02-02 DIAGNOSIS — G4730 Sleep apnea, unspecified: Secondary | ICD-10-CM

## 2019-02-02 NOTE — Progress Notes
Date of Service: 02/02/2019    Subjective:             Hailey Stewart is a 69 y.o. female.    History of Present Illness  Patient returns to clinic for follow-up. Patient states she feels that her breasts are more similar however, she states she has a point on her left super breast and excess lateral chest skin on her right. Patient has no other concerns at this time.   Body mass index is 19.39 kg/m?Marland Kitchen  Allergies   Allergen Reactions   ? Garamycin [Gentamicin] ANAPHYLAXIS        Review of Systems   Constitutional: Negative.    HENT: Negative.    Eyes: Negative.    Respiratory: Negative.    Cardiovascular: Negative.    Gastrointestinal: Negative.    Endocrine: Negative.    Genitourinary: Negative.    Musculoskeletal: Negative.    Skin: Negative.    Allergic/Immunologic: Negative.    Neurological: Negative.    Hematological: Negative.    Psychiatric/Behavioral: Negative.      Objective:         ? acetaminophen (TYLENOL) 325 mg tablet Take two tablets by mouth every 6 hours. Take q6 hours scheduled for first 3 day, then PRN after   ? allopurinol (ZYLOPRIM) 100 mg tablet Take 100 mg by mouth daily. Take with food.   ? anastrozole (ARIMIDEX) 1 mg tablet Take one tablet by mouth daily. Indications: hormone receptor positive breast cancer   ? ascorbic acid (vitamin C) (VITAMIN-C) 250 mg tablet Take 250 mg by mouth daily.   ? biotin 10,000 mcg TbDi Dissolve  by mouth daily.   ? calcium carb/vit D2/minerals (CALTRATE PLUS PO) Take  by mouth.   ? Cyanocobalamin (VITAMIN B-12) 500 mcg lozg Place 500 mcg under tongue daily.   ? diazePAM (VALIUM) 5 mg tablet Take one tablet by mouth every 6 hours as needed for Anxiety.   ? DOCUSATE SODIUM PO Take  by mouth.   ? Famotidine-Ca Carb-Mag Hydrox (PEPCID COMPLETE) 10-800-165 mg chew Chew 1 tablet by mouth twice daily.   ? gabapentin (NEURONTIN) 300 mg capsule Take 300 mg by mouth twice daily. ? HYDROcodone/acetaminophen (NORCO) 5/325 mg tablet Take one tablet to two tablets by mouth every 4 hours as needed for Pain   ? loperamide (IMODIUM A-D) 2 mg capsule Take 2 capsules by mouth initially, followed by 1 capsule by mouth after each loose stool up to a maximum of 8 tablets in 24 hours.   ? other medication Take 1 each by mouth daily. Bariatric Pal Multivitamin 1 daily  Bariatric Multivitamin + Minerals to include daily (minimum): Folic Acid (Folate) 400 mcg, Thiamine 12 mg, Vitamin A 5000 units, Vitamin E 15 IU, Vitamin K 90 mcg Copper 2 mg, and Zinc 11mg    ? pantoprazole DR (PROTONIX) 40 mg tablet Take 40 mg by mouth twice daily.   ? senna (SENOKOT) 8.6 mg tablet Take one tablet by mouth twice daily. Please take while on narcotic pain medication. Please hold for loose stools.   ? senna/docusate (SENOKOT-S) 8.6/50 mg tablet Take one tablet by mouth daily. (Patient taking differently: Take 1 tablet by mouth twice daily.)   ? simvastatin (ZOCOR) 20 mg tablet Take 20 mg by mouth at bedtime daily.   ? sucralfate (CARAFATE) 1 gram tablet Take one tablet by mouth three times daily. Take on an empty stomach.   ? turmeric root extract 538 mg cap Take  by mouth  daily.   ? vitamins, B complex tab Take 1 tablet by mouth daily.     Vitals:    02/02/19 1051   BP: 117/78   Pulse: 69   Height: 157.5 cm (62)     Physical Exam  Vitals signs reviewed.   Constitutional:       Appearance: Normal appearance. She is well-groomed.   HENT:      Head: Normocephalic.   Pulmonary:      Effort: Pulmonary effort is normal.   Chest:      Comments: Right lateral excess chest skin. Left breast has excess skin at the superior portion of incision.  Skin:     General: Skin is warm and dry.      Findings: No bruising or erythema.   Neurological:      Mental Status: She is alert.   Psychiatric:         Mood and Affect: Mood normal.         Behavior: Behavior normal. Behavior is cooperative. Thought Content: Thought content normal.         Judgment: Judgment normal.          Assessment and Plan:  Patient is s/p bilateral breast implant exchange.  Discussed further revisions with patient to address right lateral skin and left superior breast skin.  RTC in three months

## 2019-02-03 ENCOUNTER — Encounter: Admit: 2019-02-03 | Discharge: 2019-02-03 | Payer: MEDICARE

## 2019-02-09 ENCOUNTER — Encounter: Admit: 2019-02-09 | Discharge: 2019-02-09 | Payer: MEDICARE

## 2019-02-09 NOTE — Telephone Encounter
Will notify APRN and or MD for further recommendation on medications.

## 2019-02-09 NOTE — Telephone Encounter
Patient notified with instructions per APRN.

## 2019-02-09 NOTE — Telephone Encounter
Patient called to report her husband has tested positive for COVID-19.  Patient reports she got tested today through her PCP office.  Will update office on results.  Patient asking if she does test positive, does she need to hold any of her medications.

## 2019-03-31 ENCOUNTER — Encounter: Admit: 2019-03-31 | Discharge: 2019-03-31 | Payer: MEDICARE

## 2019-03-31 DIAGNOSIS — C50411 Malignant neoplasm of upper-outer quadrant of right female breast: Secondary | ICD-10-CM

## 2019-03-31 DIAGNOSIS — N651 Disproportion of reconstructed breast: Secondary | ICD-10-CM

## 2019-03-31 DIAGNOSIS — Z20822 Encounter for screening laboratory testing for COVID-19 virus in asymptomatic patient: Secondary | ICD-10-CM

## 2019-04-01 ENCOUNTER — Ambulatory Visit: Admit: 2019-04-01 | Discharge: 2019-04-01 | Payer: MEDICARE

## 2019-04-01 ENCOUNTER — Encounter: Admit: 2019-04-01 | Discharge: 2019-04-01 | Payer: MEDICARE

## 2019-04-01 DIAGNOSIS — G629 Polyneuropathy, unspecified: Secondary | ICD-10-CM

## 2019-04-01 DIAGNOSIS — M109 Gout, unspecified: Secondary | ICD-10-CM

## 2019-04-01 DIAGNOSIS — Z87898 Personal history of other specified conditions: Secondary | ICD-10-CM

## 2019-04-01 DIAGNOSIS — M1990 Unspecified osteoarthritis, unspecified site: Secondary | ICD-10-CM

## 2019-04-01 DIAGNOSIS — M797 Fibromyalgia: Secondary | ICD-10-CM

## 2019-04-01 DIAGNOSIS — C50919 Malignant neoplasm of unspecified site of unspecified female breast: Secondary | ICD-10-CM

## 2019-04-01 DIAGNOSIS — J45909 Unspecified asthma, uncomplicated: Secondary | ICD-10-CM

## 2019-04-01 DIAGNOSIS — M549 Dorsalgia, unspecified: Secondary | ICD-10-CM

## 2019-04-01 DIAGNOSIS — H9190 Unspecified hearing loss, unspecified ear: Secondary | ICD-10-CM

## 2019-04-01 DIAGNOSIS — C50411 Malignant neoplasm of upper-outer quadrant of right female breast: Secondary | ICD-10-CM

## 2019-04-01 DIAGNOSIS — G4730 Sleep apnea, unspecified: Secondary | ICD-10-CM

## 2019-04-01 DIAGNOSIS — K929 Disease of digestive system, unspecified: Secondary | ICD-10-CM

## 2019-04-01 DIAGNOSIS — N651 Disproportion of reconstructed breast: Secondary | ICD-10-CM

## 2019-04-01 NOTE — Progress Notes
[facility] phone triage completed with patient for surgery on 04/15/2019 with Dr. Billie Ruddy. Medications, allergies and medical history reviewed and updated in chart. She denies CP, SOA, seizure, stroke, N/V/D, cough, fever or flu-like symptoms.  Patient does state she has difficulty drinking fluids and does feel she hydrates herself enough occasionally causing dizziness while standing.  She denies PMH of complications with anesthesia, esophageal polyps, varices or vocal chord paralysis.  She states she can climb 2 flights of stairs without CP/DOE.  Denies URI symptoms at this time. No PAC visit indicated.    Preop and medication instructions reviewed with patient. No vitamins or supplements for 14 days and no NSAIDS for 7 days before surgery. NPO after 11 pm the night before surgery but ok to drink water until 2 hours before arrival at hospital. Patient verbalized understanding and copy of instructions were placed in MyChart.    Patient was informed of mask and visitor policy and expressed understanding.  They were informed that all nail polish, jewelry and body piercings need to be removed prior to coming to the hospital.

## 2019-04-01 NOTE — Pre-Anesthesia Medication Instructions
YOUR MEDICATION LIST     acetaminophen (TYLENOL) 325 mg tablet Take two tablets by mouth every 6 hours. Take q6 hours scheduled for first 3 day, then PRN after    allopurinol (ZYLOPRIM) 100 mg tablet Take 100 mg by mouth daily. Take with food.    anastrozole (ARIMIDEX) 1 mg tablet Take one tablet by mouth daily. Indications: hormone receptor positive breast cancer    ascorbic acid (vitamin C) (VITAMIN-C) 250 mg tablet Take 250 mg by mouth daily.    biotin 10,000 mcg TbDi Dissolve  by mouth daily.    calcium carb/vit D2/minerals (CALTRATE PLUS PO) Take  by mouth.    Cyanocobalamin (VITAMIN B-12) 500 mcg lozg Place 500 mcg under tongue daily.    diazePAM (VALIUM) 5 mg tablet Take one tablet by mouth every 6 hours as needed for Anxiety.    DOCUSATE SODIUM PO Take  by mouth.    Famotidine-Ca Carb-Mag Hydrox (PEPCID COMPLETE) 10-800-165 mg chew Chew 1 tablet by mouth twice daily.    gabapentin (NEURONTIN) 300 mg capsule Take 300 mg by mouth twice daily.    loperamide (IMODIUM A-D) 2 mg capsule Take 2 capsules by mouth initially, followed by 1 capsule by mouth after each loose stool up to a maximum of 8 tablets in 24 hours.    other medication Take 1 each by mouth daily. Bariatric Pal Multivitamin 1 daily  Bariatric Multivitamin + Minerals to include daily (minimum): Folic Acid (Folate) 400 mcg, Thiamine 12 mg, Vitamin A 5000 units, Vitamin E 15 IU, Vitamin K 90 mcg Copper 2 mg, and Zinc 11mg     pantoprazole DR (PROTONIX) 40 mg tablet Take 40 mg by mouth twice daily.    senna (SENOKOT) 8.6 mg tablet Take one tablet by mouth twice daily. Please take while on narcotic pain medication. Please hold for loose stools.    senna/docusate (SENOKOT-S) 8.6/50 mg tablet Take one tablet by mouth daily. (Patient taking differently: Take 1 tablet by mouth twice daily.)    simvastatin (ZOCOR) 20 mg tablet Take 20 mg by mouth at bedtime daily.    sucralfate (CARAFATE) 1 gram tablet Take one tablet by mouth three times daily. Take on an empty stomach.    turmeric root extract 538 mg cap Take  by mouth daily.    vitamins, B complex tab Take 1 tablet by mouth daily.         YOUR MEDICATION INSTRUCTIONS FOR SURGERY    Please continue taking your medicine as your doctor has told you to UNLESS it is listed to stop below.      14 DAYS BEFORE SURGERY  Do not take the following vitamins, herbals, and supplements:  Vitamin C  Biotin  Multivitamin  Turmeric  Vitamin B Complex  Do not start any new vitamins, herbals, or natural supplements before surgery.      7 DAYS BEFORE SURGERY  DO NOT TAKE the following anti-inflammatory medications such as ibuprofen (Advil, Motrin) and naproxen (Aleve)  You may use acetaminophen (Tylenol)      DAY BEFORE SURGERY  TAKE your medications the day before surgery as usual unless told differently      MORNING OF SURGERY  DO NOT take these medications:  Remaining vitamins/supplements  Ointments/creams/lotions  Allopurinol  Docusate  Loperamide  Senna  Senna/Docusate  Sucralfate    TAKE these medications with a sip (1-2 ounces) of water:  Anastrazole  Gabapentin  Pantoprazole  Use all inhalers, nasal sprays, and eye drops as usual  May  take if needed:  Diazepam  Pepcid Complete      Please contact Debbie, RN, with any changes to your medication list or medication questions before surgery.  E-mail:  [email or web address]  Phone: [phone]    Before going home from the hospital, please ask your doctor when you should re-start any medicine that was stopped for surgery.

## 2019-04-01 NOTE — Pre-Anesthesia Patient Instructions
GENERAL INFORMATION    Before you come to the hospital  Make arrangements for a responsible adult to drive you home and stay with you for 24 hours following surgery.  Bath/Shower Instructions  Take a bath or shower with antibacterial soap the night before or the morning of your procedure. Use clean towels.  Put on clean clothes after bath or shower.  Avoid using lotion and oils.  Sleep on clean sheets if bath or shower is done the night before procedure.  Leave money, credit cards, jewelry, and any other valuables at home. [facility] is not responsible for the loss or breakage of personal items.  Remove nail polish, makeup and all jewelry (including piercings) before coming to the hospital.  The morning of your procedure:  brush your teeth and tongue  do not smoke  do not shave the area where you will have surgery    What to bring to the hospital  ID/ Insurance Card  Medical Device card  Official documents for legal guardianship   Copy of your Living Will, Advanced Directives, and/or Durable Power of Attorney   Small bag with a few personal belongings  CPAP/BiPAP machine (including all supplies)  Cases for glasses/hearing aids/contact lens (bring solutions for contacts)  Dress in clean, loose, comfortable clothing     Eating or drinking before surgery  Do not eat or drink anything after 11:00 p.m. the day before your procedure (including gum, mints, candy, or chewing tobacco) OR follow the specific instructions you were given by your Surgeon.  You may have WATER ONLY up to 2 hours before arriving at the hospital.     Other instructions  Notify your surgeon if:  there is a possibility that you are pregnant  you become ill with a cough, fever, sore throat, nausea, vomiting or flu-like symptoms  you have any open wounds/sores that are red, painful, draining, or are new since you last saw  the doctor  you need to cancel your procedure  Arrival time:  05:45 am  Procedure time:  07:30 am    Notify us at [place]: [phone]  if you need to cancel your procedure  if you are going to be late    Arrival at the hospital    [facility]  [address]  [place], [place] [address]    This is at the [address] of [address]  Use the main entrance of the hospital, at the [facility] corner of the building. Parking is free.  Check-in for surgery is inside the main entrance.    For the safety of all patients, visitors and staff as we work to contain COVID-19, we must restrict patient visitors.      Current Visitor Policy (03/17/19):  Two visitors per patient per day.  Exceptions include:    Inpatients may have two daily visitors, but only one of those visitors is allowed at a time. Both visitors cannot visit at the same time.  Only one visitor can accompany a surgical or procedural patient visit.  Two parents/guardian for patients younger than 66.  End-of-life patients may be allowed additional support persons.  Visitors should check with the patient's nurse.  Only cancer patients at their exam visit may have one visitor with them.  No visitors are allowed for cancer patients receiving treatment/infusion services.  This applies at all cancer center locations.  Restrictions still apply for patients who test positive for COVID-19 (no visitors).  Visitors will continue to be screened at all entrances.  They must be free of fever and symptoms to be in our facilities.  We ask visitors to follow these guidelines:  Wear a mask at all times.  Go directly to the nursing station in the unit you are visiting and do not linger in public areas.  Check in at the nursing station before going to the patient's room.  Maintain a physical distance of six feet from all others.  Follow elevator restrictions to four riding at a time - peak times are 6:30-7:30 a.m., noon and 6:30-7:30 p.m.  Be aware cafeteria peak times are 11 a.m. - 1 p.m.  Wash your hands frequently and cover your coughs and sneezes.     Coronavirus (COVID19) Information  If you get sick with fever (100.4F/38C or higher), cough, or have trouble breathing:  Call your primary care physician for questions or health needs.  Tell your doctor about any recent travel and your symptoms.  Check your MyChart for your COVID swab results. (MyChart notifications are immediate and patients are often know their test result before their surgeon is notified).  Notify your surgeon if you are COVID+ positive.  If you are COVID+ positive DO NOT come to the hospital for your surgery until your surgeon has instructed you on what to do. Wait for instructions to find out if you should stay home or if you should still have surgery.  Avoid contact with others.    For up to date information on the Coronavirus, visit the CDC website at CDC.gov.

## 2019-04-02 ENCOUNTER — Encounter: Admit: 2019-04-02 | Discharge: 2019-04-02 | Payer: MEDICARE

## 2019-04-02 ENCOUNTER — Ambulatory Visit: Admit: 2019-04-02 | Discharge: 2019-04-02 | Payer: MEDICARE

## 2019-04-02 DIAGNOSIS — M549 Dorsalgia, unspecified: Secondary | ICD-10-CM

## 2019-04-02 DIAGNOSIS — G629 Polyneuropathy, unspecified: Secondary | ICD-10-CM

## 2019-04-02 DIAGNOSIS — C50919 Malignant neoplasm of unspecified site of unspecified female breast: Secondary | ICD-10-CM

## 2019-04-02 DIAGNOSIS — Z87898 Personal history of other specified conditions: Secondary | ICD-10-CM

## 2019-04-02 DIAGNOSIS — J45909 Unspecified asthma, uncomplicated: Secondary | ICD-10-CM

## 2019-04-02 DIAGNOSIS — M797 Fibromyalgia: Secondary | ICD-10-CM

## 2019-04-02 DIAGNOSIS — M1990 Unspecified osteoarthritis, unspecified site: Secondary | ICD-10-CM

## 2019-04-02 DIAGNOSIS — M109 Gout, unspecified: Secondary | ICD-10-CM

## 2019-04-02 DIAGNOSIS — H9190 Unspecified hearing loss, unspecified ear: Secondary | ICD-10-CM

## 2019-04-02 DIAGNOSIS — G4730 Sleep apnea, unspecified: Secondary | ICD-10-CM

## 2019-04-02 DIAGNOSIS — K929 Disease of digestive system, unspecified: Secondary | ICD-10-CM

## 2019-04-02 NOTE — Progress Notes
Pt had positive covid-19 results February 2021, due to recent exposure, no screening needed.

## 2019-04-14 ENCOUNTER — Encounter: Admit: 2019-04-14 | Discharge: 2019-04-14 | Payer: MEDICARE

## 2019-04-14 DIAGNOSIS — H9190 Unspecified hearing loss, unspecified ear: Secondary | ICD-10-CM

## 2019-04-14 DIAGNOSIS — Z87898 Personal history of other specified conditions: Secondary | ICD-10-CM

## 2019-04-14 DIAGNOSIS — G4730 Sleep apnea, unspecified: Secondary | ICD-10-CM

## 2019-04-14 DIAGNOSIS — G629 Polyneuropathy, unspecified: Secondary | ICD-10-CM

## 2019-04-14 DIAGNOSIS — M797 Fibromyalgia: Secondary | ICD-10-CM

## 2019-04-14 DIAGNOSIS — M1990 Unspecified osteoarthritis, unspecified site: Secondary | ICD-10-CM

## 2019-04-14 DIAGNOSIS — C50919 Malignant neoplasm of unspecified site of unspecified female breast: Secondary | ICD-10-CM

## 2019-04-14 DIAGNOSIS — J45909 Unspecified asthma, uncomplicated: Secondary | ICD-10-CM

## 2019-04-14 DIAGNOSIS — M109 Gout, unspecified: Secondary | ICD-10-CM

## 2019-04-14 DIAGNOSIS — K929 Disease of digestive system, unspecified: Secondary | ICD-10-CM

## 2019-04-14 DIAGNOSIS — M549 Dorsalgia, unspecified: Secondary | ICD-10-CM

## 2019-04-14 NOTE — Anesthesia Pre-Procedure Evaluation
Anesthesia Pre-Procedure Evaluation    Name: Hailey Stewart      MRN: [id number]     DOB: Mar 09, 1950     Age: 69 y.o.     Sex: female   _________________________________________________________________________     Procedure Info:   Procedure Information     Date/Time: 04/15/19 0715    Procedure: REVISION RECONSTRUCTED BREAST (Bilateral ) - Case length 1.5 hrs    Location: [facility] / [facility]    Surgeons: Marlinda Mike, MD          Physical Assessment  Vital Signs (last filed in past 24 hours):         Patient History   Allergies   Allergen Reactions   ? Garamycin [Gentamicin] ANAPHYLAXIS        Current Medications    Medication Directions   acetaminophen (TYLENOL) 325 mg tablet Take two tablets by mouth every 6 hours. Take q6 hours scheduled for first 3 day, then PRN after   allopurinol (ZYLOPRIM) 100 mg tablet Take 100 mg by mouth daily. Take with food.   anastrozole (ARIMIDEX) 1 mg tablet Take one tablet by mouth daily. Indications: hormone receptor positive breast cancer   ascorbic acid (vitamin C) (VITAMIN-C) 250 mg tablet Take 250 mg by mouth daily.   biotin 10,000 mcg TbDi Dissolve  by mouth daily.   calcium carb/vit D2/minerals (CALTRATE PLUS PO) Take  by mouth.   Cyanocobalamin (VITAMIN B-12) 500 mcg lozg Place 500 mcg under tongue daily.   diazePAM (VALIUM) 5 mg tablet Take one tablet by mouth every 6 hours as needed for Anxiety.   DOCUSATE SODIUM PO Take  by mouth.   Famotidine-Ca Carb-Mag Hydrox (PEPCID COMPLETE) 10-800-165 mg chew Chew 1 tablet by mouth twice daily.   gabapentin (NEURONTIN) 300 mg capsule Take 300 mg by mouth twice daily.   loperamide (IMODIUM A-D) 2 mg capsule Take 2 capsules by mouth initially, followed by 1 capsule by mouth after each loose stool up to a maximum of 8 tablets in 24 hours.   other medication Take 1 each by mouth daily. Bariatric Pal Multivitamin 1 daily  Bariatric Multivitamin + Minerals to include daily (minimum): Folic Acid (Folate) 400 mcg, Thiamine 12 mg, Vitamin A 5000 units, Vitamin E 15 IU, Vitamin K 90 mcg Copper 2 mg, and Zinc 11mg    pantoprazole DR (PROTONIX) 40 mg tablet Take 40 mg by mouth twice daily.   senna (SENOKOT) 8.6 mg tablet Take one tablet by mouth twice daily. Please take while on narcotic pain medication. Please hold for loose stools.   senna/docusate (SENOKOT-S) 8.6/50 mg tablet Take one tablet by mouth daily.  Patient taking differently: Take 1 tablet by mouth twice daily.   simvastatin (ZOCOR) 20 mg tablet Take 20 mg by mouth at bedtime daily.   sucralfate (CARAFATE) 1 gram tablet Take one tablet by mouth three times daily. Take on an empty stomach.   turmeric root extract 538 mg cap Take  by mouth daily.   vitamins, B complex tab Take 1 tablet by mouth daily.         Review of Systems/Medical History      Patient summary reviewed  Nursing notes reviewed  Pertinent labs reviewed    PONV Screening: Female gender and Non-smoker  No history of anesthetic complications  No family history of anesthetic complications      Airway - negative        Pulmonary  Not a current smoker        No indications/hx of asthma    no COPD        Sleep apnea          Interventions: CPAP; compliant      Cardiovascular         Exercise tolerance: >4 METS      Beta Blocker therapy: No      Beta blockers within 24 hours: n/a      No AICD      No hypertension,       No valvular problems/murmurs      No past MI,       No hx of coronary artery disease      No PTCA      No dysrhythmias      No angina      Hyperlipidemia      No dyspnea on exertion      GI/Hepatic/Renal           GERD, well controlled      No hx of liver disease     No renal disease      Neuro/Psych       No seizures      No CVA      Neuropathy      Musculoskeletal         No neck pain      Back pain      Arthritis      Endocrine/Other       No diabetes      No hypothyroidism      No anemia      Malignancy (h/o breast cancer)    Constitution - negative   Physical Exam    Airway Findings      Mallampati: II      TM distance: >3 FB      Neck ROM: full      Mouth opening: good      Airway patency: adequate    Dental Findings: Negative      Cardiovascular Findings: Negative      Rhythm: regular      Rate: normal      No murmur    Pulmonary Findings: Negative      Breath sounds clear to auscultation.    Abdominal Findings: Negative      Neurological Findings: Negative      Alert and oriented x 3    Constitutional findings: Negative      No acute distress      Well-developed      Well-nourished       Diagnostic Tests  Hematology:   Lab Results   Component Value Date    HGB 10.0 03/03/2018    HCT 29.9 03/03/2018    PLTCT 240 03/03/2018    WBC 2.9 03/03/2018    NEUT 65 03/03/2018    ANC 1.90 03/03/2018    LYMPH 5 11/21/2017    ALC 0.50 03/03/2018    MONA 15 03/03/2018    AMC 0.40 03/03/2018    EOSA 3 03/03/2018    ABC 0.00 03/03/2018    BASOPHILS 1 11/07/2017    MCV 96.6 03/03/2018    MCH 32.5 03/03/2018    MCHC 33.6 03/03/2018    MPV 8.4 03/03/2018    RDW 13.7 03/03/2018         General Chemistry:   Lab Results   Component Value Date    NA 140 01/28/2019    K  4.1 01/28/2019    CL 104 01/28/2019    CO2 30 01/28/2019    GAP 6 01/28/2019    BUN 18 01/28/2019    CR 0.78 01/28/2019    GLU 92 01/28/2019    CA 9.2 01/28/2019    ALBUMIN 4.1 01/28/2019    TOTBILI 0.6 01/28/2019      Coagulation: No results found for: PT, PTT, INR      Anesthesia Plan    ASA score: 2   Plan: general  Induction method: intravenous  NPO status: acceptable      Informed Consent  Anesthetic plan and risks discussed with patient.        Plan discussed with: anesthesiologist, CRNA and surgeon/proceduralist.  Comments: (I have discussed the risks/benefits/possible complications of general anesthesia with the patient.  These have included dental/oral injury/tooth loss during airway manipulation, nausea/vomiting, delayed/prolonged emergence, and possible cardiac or pulmonary complications.  The patient expressed understanding and wishes to proceed with a general anesthetic.)

## 2019-04-15 ENCOUNTER — Ambulatory Visit: Admit: 2019-04-15 | Discharge: 2019-04-15 | Payer: MEDICARE

## 2019-04-15 ENCOUNTER — Encounter: Admit: 2019-04-15 | Discharge: 2019-04-15 | Payer: MEDICARE

## 2019-04-15 DIAGNOSIS — M549 Dorsalgia, unspecified: Secondary | ICD-10-CM

## 2019-04-15 DIAGNOSIS — G629 Polyneuropathy, unspecified: Secondary | ICD-10-CM

## 2019-04-15 DIAGNOSIS — Z87898 Personal history of other specified conditions: Secondary | ICD-10-CM

## 2019-04-15 DIAGNOSIS — J45909 Unspecified asthma, uncomplicated: Secondary | ICD-10-CM

## 2019-04-15 DIAGNOSIS — M797 Fibromyalgia: Secondary | ICD-10-CM

## 2019-04-15 DIAGNOSIS — M109 Gout, unspecified: Secondary | ICD-10-CM

## 2019-04-15 DIAGNOSIS — C50919 Malignant neoplasm of unspecified site of unspecified female breast: Secondary | ICD-10-CM

## 2019-04-15 DIAGNOSIS — G4730 Sleep apnea, unspecified: Secondary | ICD-10-CM

## 2019-04-15 DIAGNOSIS — M1990 Unspecified osteoarthritis, unspecified site: Secondary | ICD-10-CM

## 2019-04-15 DIAGNOSIS — H9190 Unspecified hearing loss, unspecified ear: Secondary | ICD-10-CM

## 2019-04-15 DIAGNOSIS — K929 Disease of digestive system, unspecified: Secondary | ICD-10-CM

## 2019-04-15 MED ORDER — ONDANSETRON HCL (PF) 4 MG/2 ML IJ SOLN
4 mg | Freq: Once | INTRAVENOUS | 0 refills | Status: DC | PRN
Start: 2019-04-15 — End: 2019-04-15

## 2019-04-15 MED ORDER — DIPHENHYDRAMINE HCL 50 MG/ML IJ SOLN
25 mg | Freq: Once | INTRAVENOUS | 0 refills | Status: DC | PRN
Start: 2019-04-15 — End: 2019-04-15

## 2019-04-15 MED ORDER — ACETAMINOPHEN 500 MG PO TAB
1000 mg | Freq: Once | ORAL | 0 refills | Status: CP
Start: 2019-04-15 — End: ?
  Administered 2019-04-15: 13:00:00 1000 mg via ORAL

## 2019-04-15 MED ORDER — LIDOCAINE (PF) 200 MG/10 ML (2 %) IJ SYRG
0 refills | Status: DC
Start: 2019-04-15 — End: 2019-04-15
  Administered 2019-04-15: 14:00:00 60 mg via INTRAVENOUS

## 2019-04-15 MED ORDER — PROPOFOL INJ 10 MG/ML IV VIAL
0 refills | Status: DC
Start: 2019-04-15 — End: 2019-04-15
  Administered 2019-04-15: 14:00:00 20 mg via INTRAVENOUS
  Administered 2019-04-15: 14:00:00 150 mg via INTRAVENOUS

## 2019-04-15 MED ORDER — FENTANYL CITRATE (PF) 50 MCG/ML IJ SOLN
50 ug | INTRAVENOUS | 0 refills | Status: DC | PRN
Start: 2019-04-15 — End: 2019-04-15

## 2019-04-15 MED ORDER — FENTANYL CITRATE (PF) 50 MCG/ML IJ SOLN
0 refills | Status: DC
Start: 2019-04-15 — End: 2019-04-15
  Administered 2019-04-15: 14:00:00 100 ug via INTRAVENOUS

## 2019-04-15 MED ORDER — MIDAZOLAM 1 MG/ML IJ SOLN
INTRAVENOUS | 0 refills | Status: DC
Start: 2019-04-15 — End: 2019-04-15
  Administered 2019-04-15: 14:00:00 1 mg via INTRAVENOUS

## 2019-04-15 MED ORDER — PATCH DOCUMENTATION - SCOPOLAMINE BASE 1 MG/72HR
1 | Freq: Two times a day (BID) | TRANSDERMAL | 0 refills | Status: DC
Start: 2019-04-15 — End: 2019-04-15

## 2019-04-15 MED ORDER — EPHEDRINE SULFATE 50 MG/ML IV SOLN
0 refills | Status: DC
Start: 2019-04-15 — End: 2019-04-15
  Administered 2019-04-15: 14:00:00 15 mg via INTRAVENOUS

## 2019-04-15 MED ORDER — HALOPERIDOL LACTATE 5 MG/ML IJ SOLN
1 mg | Freq: Once | INTRAVENOUS | 0 refills | Status: DC | PRN
Start: 2019-04-15 — End: 2019-04-15

## 2019-04-15 MED ORDER — ONDANSETRON HCL (PF) 4 MG/2 ML IJ SOLN
INTRAVENOUS | 0 refills | Status: DC
Start: 2019-04-15 — End: 2019-04-15
  Administered 2019-04-15: 15:00:00 4 mg via INTRAVENOUS

## 2019-04-15 MED ORDER — LACTATED RINGERS IV SOLP
INTRAVENOUS | 0 refills | Status: DC
Start: 2019-04-15 — End: 2019-04-15
  Administered 2019-04-15: 14:00:00 1000.000 mL via INTRAVENOUS

## 2019-04-15 MED ORDER — LIDOCAINE (PF) 10 MG/ML (1 %) IJ SOLN
.1-2 mL | INTRAMUSCULAR | 0 refills | Status: DC | PRN
Start: 2019-04-15 — End: 2019-04-15
  Administered 2019-04-15: 14:00:00 0.1 mL via INTRAMUSCULAR

## 2019-04-15 MED ORDER — OXYCODONE 5 MG PO TAB
5-10 mg | Freq: Once | ORAL | 0 refills | Status: CP | PRN
Start: 2019-04-15 — End: ?
  Administered 2019-04-15: 16:00:00 5 mg via ORAL

## 2019-04-15 MED ORDER — CELECOXIB 200 MG PO CAP
200 mg | Freq: Once | ORAL | 0 refills | Status: CP
Start: 2019-04-15 — End: ?
  Administered 2019-04-15: 13:00:00 200 mg via ORAL

## 2019-04-15 MED ORDER — ACETAMINOPHEN 650 MG PO TBER
650 mg | ORAL_TABLET | ORAL | 0 refills | Status: AC | PRN
Start: 2019-04-15 — End: ?

## 2019-04-15 MED ORDER — FENTANYL CITRATE (PF) 50 MCG/ML IJ SOLN
25 ug | INTRAVENOUS | 0 refills | Status: DC | PRN
Start: 2019-04-15 — End: 2019-04-15

## 2019-04-15 MED ORDER — SCOPOLAMINE BASE 1.5 MG (1 MG OVER 3 DAYS) TD PT3D
1 | Freq: Once | TRANSDERMAL | 0 refills | Status: DC
Start: 2019-04-15 — End: 2019-04-15
  Administered 2019-04-15: 13:00:00 1 via TRANSDERMAL

## 2019-04-15 MED ORDER — MEPERIDINE (PF) 25 MG/ML IJ SYRG
12.5 mg | INTRAVENOUS | 0 refills | Status: DC | PRN
Start: 2019-04-15 — End: 2019-04-15

## 2019-04-15 MED ORDER — SUCCINYLCHOLINE CHLORIDE 20 MG/ML IJ SOLN
INTRAVENOUS | 0 refills | Status: DC
Start: 2019-04-15 — End: 2019-04-15
  Administered 2019-04-15: 14:00:00 80 mg via INTRAVENOUS

## 2019-04-15 MED ORDER — DEXAMETHASONE SODIUM PHOSPHATE 4 MG/ML IJ SOLN
INTRAVENOUS | 0 refills | Status: DC
Start: 2019-04-15 — End: 2019-04-15
  Administered 2019-04-15: 14:00:00 4 mg via INTRAVENOUS

## 2019-04-23 ENCOUNTER — Encounter: Admit: 2019-04-23 | Discharge: 2019-04-23 | Payer: MEDICARE

## 2019-04-23 ENCOUNTER — Ambulatory Visit: Admit: 2019-04-23 | Discharge: 2019-04-23 | Payer: MEDICARE

## 2019-04-23 DIAGNOSIS — G4730 Sleep apnea, unspecified: Secondary | ICD-10-CM

## 2019-04-23 DIAGNOSIS — C50411 Malignant neoplasm of upper-outer quadrant of right female breast: Secondary | ICD-10-CM

## 2019-04-23 DIAGNOSIS — C50919 Malignant neoplasm of unspecified site of unspecified female breast: Secondary | ICD-10-CM

## 2019-04-23 DIAGNOSIS — H9190 Unspecified hearing loss, unspecified ear: Secondary | ICD-10-CM

## 2019-04-23 DIAGNOSIS — N651 Disproportion of reconstructed breast: Secondary | ICD-10-CM

## 2019-04-23 DIAGNOSIS — M549 Dorsalgia, unspecified: Secondary | ICD-10-CM

## 2019-04-23 DIAGNOSIS — J45909 Unspecified asthma, uncomplicated: Secondary | ICD-10-CM

## 2019-04-23 DIAGNOSIS — G629 Polyneuropathy, unspecified: Secondary | ICD-10-CM

## 2019-04-23 DIAGNOSIS — M797 Fibromyalgia: Secondary | ICD-10-CM

## 2019-04-23 DIAGNOSIS — M109 Gout, unspecified: Secondary | ICD-10-CM

## 2019-04-23 DIAGNOSIS — Z87898 Personal history of other specified conditions: Secondary | ICD-10-CM

## 2019-04-23 DIAGNOSIS — M1990 Unspecified osteoarthritis, unspecified site: Secondary | ICD-10-CM

## 2019-04-23 DIAGNOSIS — K929 Disease of digestive system, unspecified: Secondary | ICD-10-CM

## 2019-04-23 NOTE — Progress Notes
Subjective:       History of Present Illness  Hailey Stewart is a 69 y.o. female. Patient returns to clinic for follow-up. Patient is overall doing well. Patient admits some soreness of bilateral breasts with occasional shoot pain. Pain is tolerable. Patient has concerns of some spots on her torso and upper extremities that she would like addressed by dermatology. Patient states she will see a provider in [place], [place]. No other concerns.  Body mass index is 18.66 kg/m?Marland Kitchen  Allergies   Allergen Reactions   ? Garamycin [Gentamicin] ANAPHYLAXIS        Review of Systems   Constitutional: Negative.    HENT: Negative.    Eyes: Negative.    Respiratory: Negative.    Cardiovascular: Negative.    Gastrointestinal: Negative.    Endocrine: Negative.    Genitourinary: Negative.    Musculoskeletal: Positive for arthralgias and back pain.   Skin: Positive for color change.   Allergic/Immunologic: Negative.    Neurological: Negative.    Hematological: Negative.    Psychiatric/Behavioral: Negative.      Objective:         ? acetaminophen SR (TYLENOL) 650 mg tablet Take one tablet by mouth every 6 hours as needed for Pain (Please take every 6 hours for three days; then only take as needed for pain.).   ? allopurinol (ZYLOPRIM) 100 mg tablet Take 100 mg by mouth daily. Take with food.   ? anastrozole (ARIMIDEX) 1 mg tablet Take one tablet by mouth daily. Indications: hormone receptor positive breast cancer   ? ascorbic acid (vitamin C) (VITAMIN-C) 250 mg tablet Take 250 mg by mouth daily.   ? biotin 10,000 mcg TbDi Dissolve  by mouth daily.   ? Cyanocobalamin (VITAMIN B-12) 500 mcg lozg Place 500 mcg under tongue daily.   ? diazePAM (VALIUM) 5 mg tablet Take one tablet by mouth every 6 hours as needed for Anxiety.   ? Famotidine-Ca Carb-Mag Hydrox (PEPCID COMPLETE) 10-800-165 mg chew Chew 1 tablet by mouth twice daily.   ? gabapentin (NEURONTIN) 300 mg capsule Take 300 mg by mouth twice daily.   ? loperamide (IMODIUM A-D) 2 mg capsule Take 2 capsules by mouth initially, followed by 1 capsule by mouth after each loose stool up to a maximum of 8 tablets in 24 hours.   ? other medication Take 1 each by mouth daily. Bariatric Pal Multivitamin 1 daily  Bariatric Multivitamin + Minerals to include daily (minimum): Folic Acid (Folate) 400 mcg, Thiamine 12 mg, Vitamin A 5000 units, Vitamin E 15 IU, Vitamin K 90 mcg Copper 2 mg, and Zinc 11mg    ? pantoprazole DR (PROTONIX) 40 mg tablet Take 40 mg by mouth twice daily.   ? senna (SENOKOT) 8.6 mg tablet Take one tablet by mouth twice daily. Please take while on narcotic pain medication. Please hold for loose stools.   ? senna/docusate (SENOKOT-S) 8.6/50 mg tablet Take one tablet by mouth daily. (Patient taking differently: Take 1 tablet by mouth twice daily.)   ? simvastatin (ZOCOR) 20 mg tablet Take 20 mg by mouth at bedtime daily.   ? turmeric root extract 538 mg cap Take  by mouth daily.   ? vitamins, B complex tab Take 1 tablet by mouth daily.     Vitals:    04/23/19 1042 04/23/19 1043   BP:  112/72   Pulse:  68   Temp:  36.5 ?C (97.7 ?F)   TempSrc:  Skin   Weight:  46.3 kg (  102 lb)   Height:  157.5 cm (62)   PainSc: Three Three     Physical Exam  Vitals signs reviewed.   Constitutional:       Appearance: Normal appearance. She is well-groomed.   HENT:      Head: Normocephalic.   Pulmonary:      Effort: Pulmonary effort is normal.   Chest:       Skin:     General: Skin is warm and dry.      Findings: Bruising present.   Neurological:      Mental Status: She is alert.   Psychiatric:         Mood and Affect: Mood normal.         Behavior: Behavior normal. Behavior is cooperative.         Thought Content: Thought content normal.         Judgment: Judgment normal.          Assessment and Plan:  Patient s/p bilateral revision to reconstructed breasts.  Patient notes an improvement of the appearance of her breasts.  Continue supportive bra.  Advised patient to follow-up with a Dermatologist.   RTC in one month.

## 2019-04-24 IMAGING — MR MRI Lower Ext Joint unilateral
9 series · 16 of 16 positions shown · non-contrast
Comparison: none

Magnetic resonance imaging left knee without contrast
HISTORY: Remote knee injury 30 years ago.  Anterior cruciate ligament surgery 5 years ago.  Pain after trampoline injury 3 weeks ago.

[Series 101: axial survey · axial · 10.0mm · 1.17mm/px · 1 of 5 slices shown]
[im 1/5]
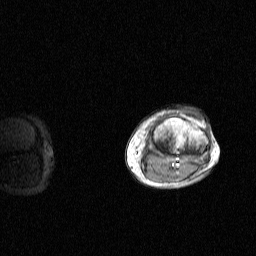

[Series 201: 3 plane survey · axial · 10.0mm · 1.15mm/px · 1 of 9 slices shown]
[im 1/9]
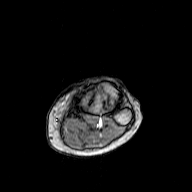

[Series 302: epdw fatsat · axial · 3.0mm · 0.31mm/px · z∈[-64,+52]mm · 3 of 30 slices shown (1 of 3)]
[im 1/30]
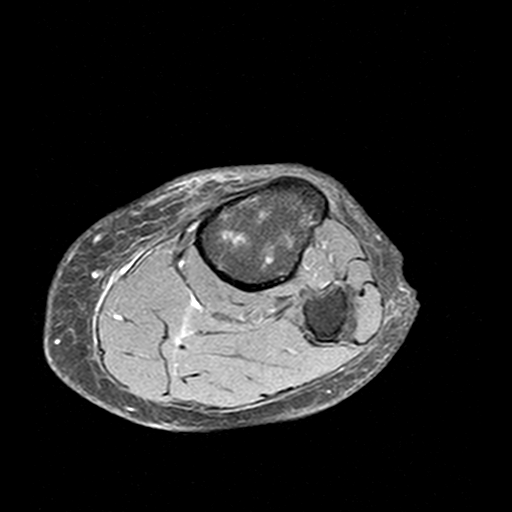
[im 15/30]
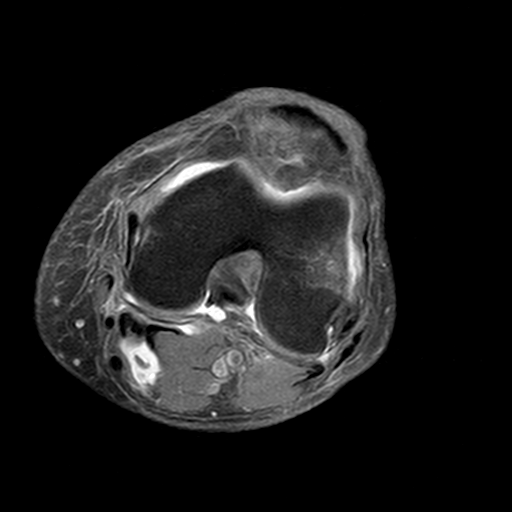
[im 30/30]
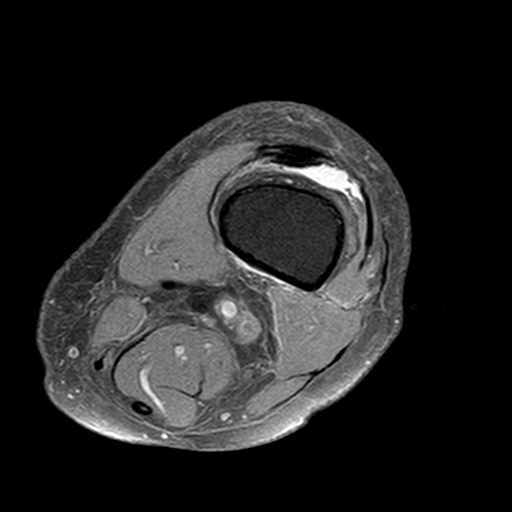

[Series 401: pdw_dr_tse · sagittal · 3.0mm · 0.31mm/px · 2 of 26 slices shown]
[im 1/26]
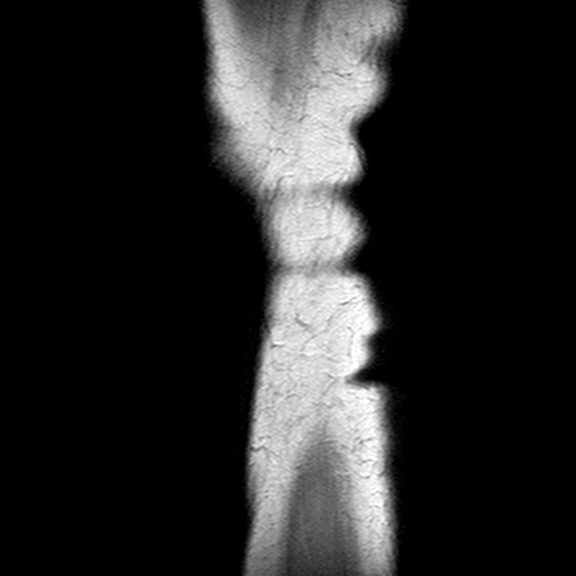
[im 26/26]
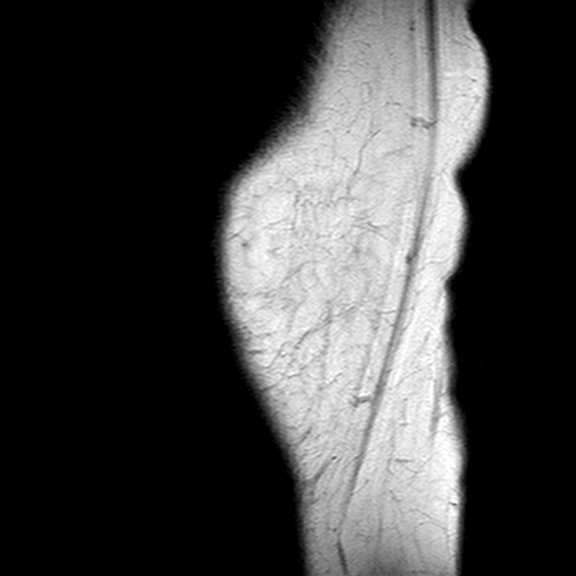

[Series 502: epdw fatsat · sagittal · 3.0mm · 0.31mm/px · 2 of 26 slices shown (2 of 3)]
[im 1/26]
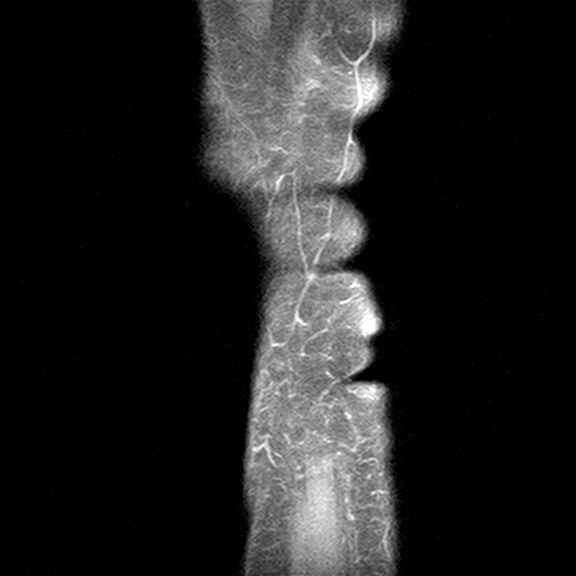
[im 26/26]
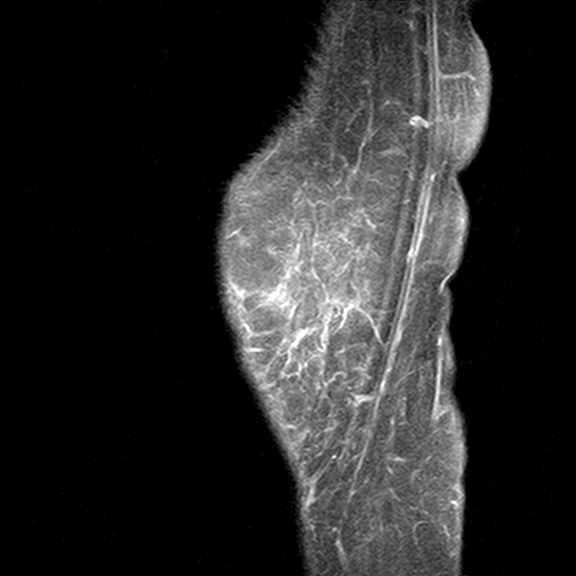

[Series 602: estir_2 · coronal · 3.0mm · 0.35mm/px · 2 of 24 slices shown]
[im 1/24]
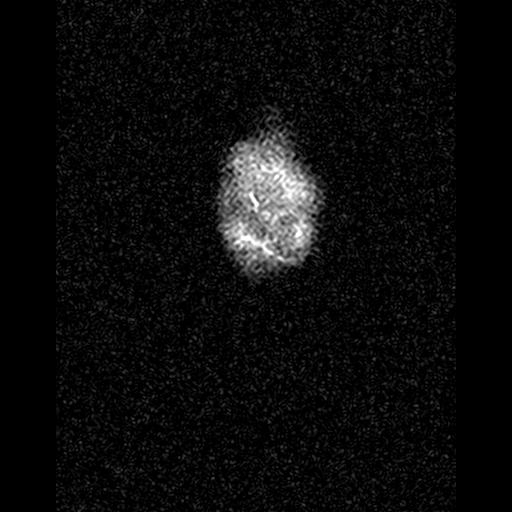
[im 24/24]
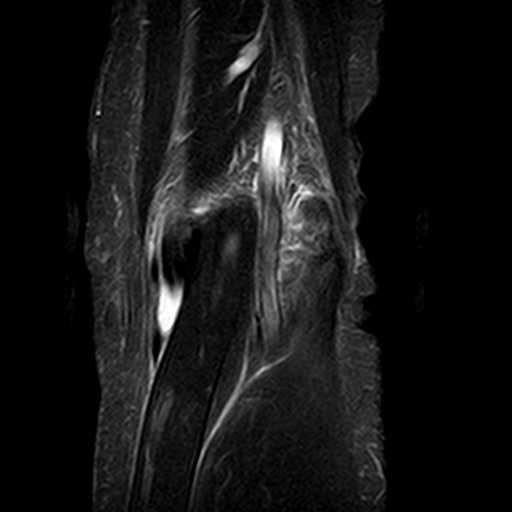

[Series 702: et1w_tse · coronal · 3.0mm · 0.31mm/px · 2 of 24 slices shown]
[im 1/24]
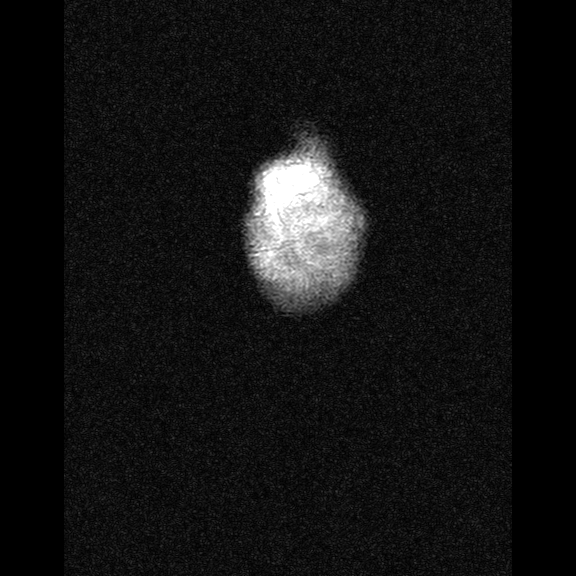
[im 24/24]
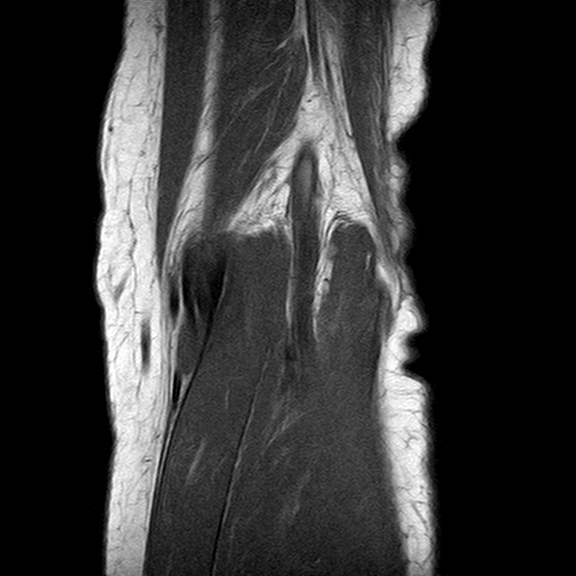

[Series 802: epdw fatsat · coronal · 3.0mm · 0.31mm/px · 2 of 24 slices shown (3 of 3)]
[im 1/24]
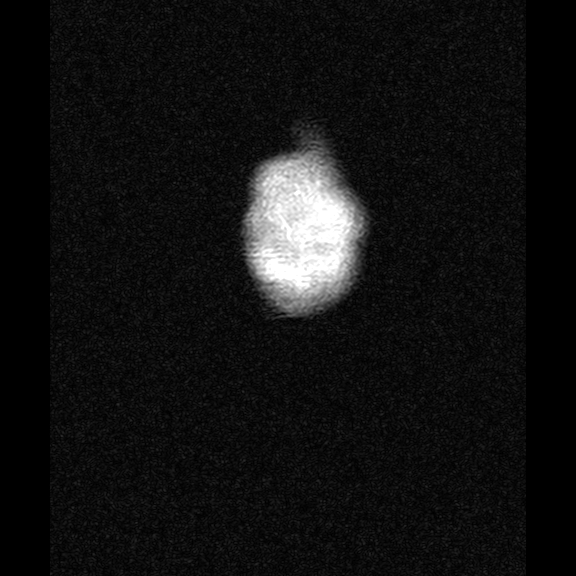
[im 24/24]
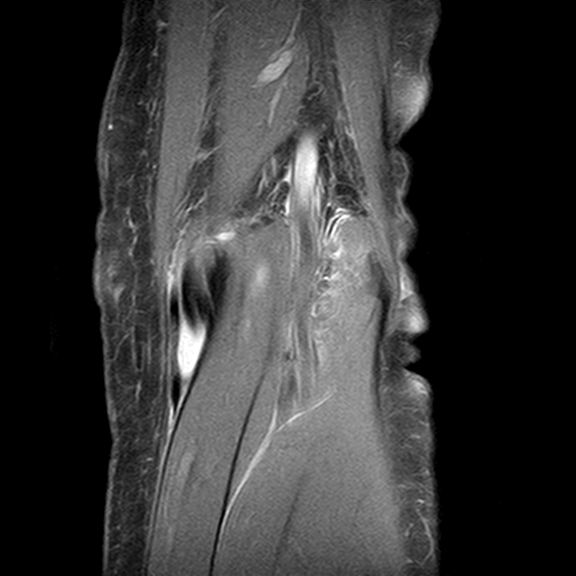

[Series 901: T2 · sagittal · 2.0mm · 0.31mm/px · 1 of 13 slices shown]
[im 1/13]
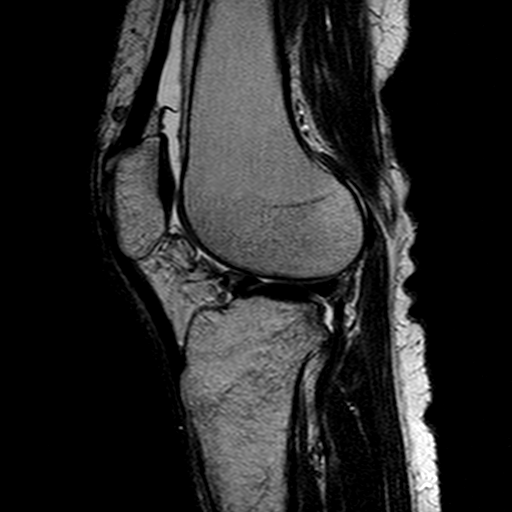

[16 of 16 positions shown; findings below may reference images not displayed]

FINDINGS: Multiplanar magnetic resonance imaging of the left knee is performed without contrast.

In the patellofemoral compartment, there is a moderate suprapatellar joint effusion.  There is a partial tear through the medial patellar retinaculum with adjacent medial patellar marrow edema.  The lateral patellar retinaculum, patellar tendon, articular cartilage, and quadriceps tendon are intact.  Prepatellar edema is present.

The anterior cruciate ligament is smaller than normal and diffusely hyperintense.  The posterior cruciate ligament is intact.

In the medial compartment, there is extensive marrow edema in the medial tibial plateau with a minimally depressed fracture along its posterior margin.  A small to moderate sized popliteal cyst is present.  Minimal marrow edema is present in the medial femoral epicondyle.  Articular cartilage thickness is intact.  There is globular signal in the body and posterior horn of the medial meniscus without evidence of an arthroscopically detectable tear.  Medial capsular edema is present without medial collateral ligament tear.

In the lateral compartment, there is extensive marrow edema in the lateral tibial plateau and lateral margin of the lateral femoral condyle and epicondyle.  There may be a partial tear of the distal fibular collateral ligament.  The iliotibial band and biceps femora is tendon are intact.  A small vertical tear is present in the superior surface of the posterior horn lateral meniscus.Impressions:

1. Extensive bone contusion in the tibial plateaus with a small mildly depressed fracture through the posterior periphery of the medial tibial plateau.

2. Medial patellar bone contusion with partial tear of the medial patellar retinaculum.

3. Lateral femoral condyle bone contusion and small bilateral femoral epicondyle bone contusions.

4. Joint effusion and popliteal cyst.

5. Small vertical tear through posterior horn lateral meniscus.

6. Degenerative signal abnormality in the medial meniscus.

7. Small hyperintense anterior cruciate ligament remnant.  This may represent partial tear of the remaining ligament fibers.

8. Partial fibular collateral ligament tear.

## 2019-05-21 ENCOUNTER — Encounter: Admit: 2019-05-21 | Discharge: 2019-05-21 | Payer: MEDICARE

## 2019-05-21 ENCOUNTER — Ambulatory Visit: Admit: 2019-05-21 | Discharge: 2019-05-22 | Payer: MEDICARE

## 2019-05-21 DIAGNOSIS — C50411 Malignant neoplasm of upper-outer quadrant of right female breast: Secondary | ICD-10-CM

## 2019-05-21 DIAGNOSIS — M549 Dorsalgia, unspecified: Secondary | ICD-10-CM

## 2019-05-21 DIAGNOSIS — N651 Disproportion of reconstructed breast: Secondary | ICD-10-CM

## 2019-05-21 DIAGNOSIS — G629 Polyneuropathy, unspecified: Secondary | ICD-10-CM

## 2019-05-21 DIAGNOSIS — C50919 Malignant neoplasm of unspecified site of unspecified female breast: Secondary | ICD-10-CM

## 2019-05-21 DIAGNOSIS — M109 Gout, unspecified: Secondary | ICD-10-CM

## 2019-05-21 DIAGNOSIS — M797 Fibromyalgia: Secondary | ICD-10-CM

## 2019-05-21 DIAGNOSIS — K929 Disease of digestive system, unspecified: Secondary | ICD-10-CM

## 2019-05-21 DIAGNOSIS — J45909 Unspecified asthma, uncomplicated: Secondary | ICD-10-CM

## 2019-05-21 DIAGNOSIS — M1990 Unspecified osteoarthritis, unspecified site: Secondary | ICD-10-CM

## 2019-05-21 DIAGNOSIS — Z87898 Personal history of other specified conditions: Secondary | ICD-10-CM

## 2019-05-21 DIAGNOSIS — H9190 Unspecified hearing loss, unspecified ear: Secondary | ICD-10-CM

## 2019-05-21 DIAGNOSIS — G4730 Sleep apnea, unspecified: Secondary | ICD-10-CM

## 2019-05-21 NOTE — Progress Notes
Subjective:       History of Present Illness  Hailey Stewart is a 69 y.o. female. Patient returns to clinic for follow-up. Patient is overall doing well. Patient admits some soreness of bilateral breasts with occasional shoot pain. Pain is well controlled. Patient has concerns of some spots on her torso and upper extremities that she would like addressed by dermatology - diagnosed as oak mite bites. No other concerns.  Body mass index is 18.66 kg/m?Marland Kitchen  Allergies   Allergen Reactions   ? Garamycin [Gentamicin] ANAPHYLAXIS        Review of Systems   Constitutional: Negative.    HENT: Negative.    Eyes: Negative.    Respiratory: Negative.    Cardiovascular: Negative.    Gastrointestinal: Negative.    Endocrine: Negative.    Genitourinary: Negative.    Musculoskeletal: Positive for arthralgias and back pain.   Skin: Positive for color change.   Allergic/Immunologic: Negative.    Neurological: Negative.    Hematological: Negative.    Psychiatric/Behavioral: Negative.      Objective:         ? acetaminophen SR (TYLENOL) 650 mg tablet Take one tablet by mouth every 6 hours as needed for Pain (Please take every 6 hours for three days; then only take as needed for pain.).   ? allopurinol (ZYLOPRIM) 100 mg tablet Take 100 mg by mouth daily. Take with food.   ? anastrozole (ARIMIDEX) 1 mg tablet Take one tablet by mouth daily. Indications: hormone receptor positive breast cancer   ? ascorbic acid (vitamin C) (VITAMIN-C) 250 mg tablet Take 250 mg by mouth daily.   ? biotin 10,000 mcg TbDi Dissolve  by mouth daily.   ? Cyanocobalamin (VITAMIN B-12) 500 mcg lozg Place 500 mcg under tongue daily.   ? diazePAM (VALIUM) 5 mg tablet Take one tablet by mouth every 6 hours as needed for Anxiety.   ? Famotidine-Ca Carb-Mag Hydrox (PEPCID COMPLETE) 10-800-165 mg chew Chew 1 tablet by mouth twice daily.   ? gabapentin (NEURONTIN) 300 mg capsule Take 300 mg by mouth twice daily.   ? loperamide (IMODIUM A-D) 2 mg capsule Take 2 capsules by mouth initially, followed by 1 capsule by mouth after each loose stool up to a maximum of 8 tablets in 24 hours.   ? other medication Take 1 each by mouth daily. Bariatric Pal Multivitamin 1 daily  Bariatric Multivitamin + Minerals to include daily (minimum): Folic Acid (Folate) 400 mcg, Thiamine 12 mg, Vitamin A 5000 units, Vitamin E 15 IU, Vitamin K 90 mcg Copper 2 mg, and Zinc 11mg    ? pantoprazole DR (PROTONIX) 40 mg tablet Take 40 mg by mouth twice daily.   ? senna (SENOKOT) 8.6 mg tablet Take one tablet by mouth twice daily. Please take while on narcotic pain medication. Please hold for loose stools.   ? senna/docusate (SENOKOT-S) 8.6/50 mg tablet Take one tablet by mouth daily. (Patient taking differently: Take 1 tablet by mouth twice daily.)   ? simvastatin (ZOCOR) 20 mg tablet Take 20 mg by mouth at bedtime daily.   ? turmeric root extract 538 mg cap Take  by mouth daily.   ? vitamins, B complex tab Take 1 tablet by mouth daily.     Vitals:    05/21/19 1050   BP: 115/60   Pulse: 80   Temp: 36.3 ?C (97.3 ?F)   Weight: 46.3 kg (102 lb)   Height: 157.5 cm (62)   PainSc: Zero  Physical Exam  Vitals reviewed.   Constitutional:       Appearance: Normal appearance. She is well-groomed.   HENT:      Head: Normocephalic.   Pulmonary:      Effort: Pulmonary effort is normal.   Chest:       Skin:     General: Skin is warm and dry.      Findings: Bruising present.   Neurological:      Mental Status: She is alert.   Psychiatric:         Mood and Affect: Mood normal.         Behavior: Behavior normal. Behavior is cooperative.         Thought Content: Thought content normal.         Judgment: Judgment normal.          Assessment and Plan:  Patient s/p bilateral revision to reconstructed breasts.  Patient notes an improvement of the appearance of her breasts.  Continue supportive bra.  RTC in four months.

## 2019-07-07 ENCOUNTER — Encounter: Admit: 2019-07-07 | Discharge: 2019-07-07 | Payer: MEDICARE

## 2019-07-07 MED ORDER — ANASTROZOLE 1 MG PO TAB
ORAL_TABLET | Freq: Every day | ORAL | 3 refills | 33.00000 days | Status: AC
Start: 2019-07-07 — End: ?

## 2019-07-28 NOTE — Patient Instructions
Thank you for coming to see us today.   Please call our office or send a message through MyChart if you have any questions or concerns.          Dr. Anne O'Dea and Michelle Prager, APRN  [phone] (Nurses: Tara Gardner/Spenser Harren/Bailee Deviney)  [phone] (fax)    [facility] Location  [address]  [place], [place] [address]    [facility]  [address]  [place], [place] [address]

## 2019-08-03 ENCOUNTER — Encounter: Admit: 2019-08-03 | Discharge: 2019-08-03 | Payer: MEDICARE

## 2019-08-03 MED ORDER — DENOSUMAB 60 MG/ML SC SYRG
60 mg | Freq: Once | SUBCUTANEOUS | 0 refills | Status: CN
Start: 2019-08-03 — End: ?

## 2019-08-05 ENCOUNTER — Encounter: Admit: 2019-08-05 | Discharge: 2019-08-05 | Payer: MEDICARE

## 2019-08-05 DIAGNOSIS — C50411 Malignant neoplasm of upper-outer quadrant of right female breast: Secondary | ICD-10-CM

## 2019-08-05 DIAGNOSIS — Z87898 Personal history of other specified conditions: Secondary | ICD-10-CM

## 2019-08-05 DIAGNOSIS — M818 Other osteoporosis without current pathological fracture: Secondary | ICD-10-CM

## 2019-08-05 DIAGNOSIS — K929 Disease of digestive system, unspecified: Secondary | ICD-10-CM

## 2019-08-05 DIAGNOSIS — M109 Gout, unspecified: Secondary | ICD-10-CM

## 2019-08-05 DIAGNOSIS — M1990 Unspecified osteoarthritis, unspecified site: Secondary | ICD-10-CM

## 2019-08-05 DIAGNOSIS — Z79811 Long term (current) use of aromatase inhibitors: Secondary | ICD-10-CM

## 2019-08-05 DIAGNOSIS — M549 Dorsalgia, unspecified: Secondary | ICD-10-CM

## 2019-08-05 DIAGNOSIS — H9190 Unspecified hearing loss, unspecified ear: Secondary | ICD-10-CM

## 2019-08-05 DIAGNOSIS — J45909 Unspecified asthma, uncomplicated: Secondary | ICD-10-CM

## 2019-08-05 DIAGNOSIS — C50919 Malignant neoplasm of unspecified site of unspecified female breast: Secondary | ICD-10-CM

## 2019-08-05 DIAGNOSIS — G629 Polyneuropathy, unspecified: Secondary | ICD-10-CM

## 2019-08-05 DIAGNOSIS — M797 Fibromyalgia: Secondary | ICD-10-CM

## 2019-08-05 DIAGNOSIS — R52 Pain, unspecified: Secondary | ICD-10-CM

## 2019-08-05 DIAGNOSIS — G4730 Sleep apnea, unspecified: Secondary | ICD-10-CM

## 2019-08-05 MED ORDER — DENOSUMAB 60 MG/ML SC SYRG
60 mg | Freq: Once | SUBCUTANEOUS | 0 refills | Status: CP
Start: 2019-08-05 — End: ?
  Administered 2019-08-05: 20:00:00 60 mg via SUBCUTANEOUS

## 2019-08-05 NOTE — Progress Notes
Patient received Prolia and tolerated without difficulty.  No pertinent changes since last assessment.

## 2019-08-20 ENCOUNTER — Ambulatory Visit: Admit: 2019-08-20 | Discharge: 2019-08-20 | Payer: MEDICARE

## 2019-08-20 ENCOUNTER — Encounter: Admit: 2019-08-20 | Discharge: 2019-08-20 | Payer: MEDICARE

## 2019-08-20 DIAGNOSIS — H9190 Unspecified hearing loss, unspecified ear: Secondary | ICD-10-CM

## 2019-08-20 DIAGNOSIS — G629 Polyneuropathy, unspecified: Secondary | ICD-10-CM

## 2019-08-20 DIAGNOSIS — N650 Deformity of reconstructed breast: Secondary | ICD-10-CM

## 2019-08-20 DIAGNOSIS — C50919 Malignant neoplasm of unspecified site of unspecified female breast: Secondary | ICD-10-CM

## 2019-08-20 DIAGNOSIS — G4730 Sleep apnea, unspecified: Secondary | ICD-10-CM

## 2019-08-20 DIAGNOSIS — M797 Fibromyalgia: Secondary | ICD-10-CM

## 2019-08-20 DIAGNOSIS — Z87898 Personal history of other specified conditions: Secondary | ICD-10-CM

## 2019-08-20 DIAGNOSIS — M1990 Unspecified osteoarthritis, unspecified site: Secondary | ICD-10-CM

## 2019-08-20 DIAGNOSIS — J45909 Unspecified asthma, uncomplicated: Secondary | ICD-10-CM

## 2019-08-20 DIAGNOSIS — M109 Gout, unspecified: Secondary | ICD-10-CM

## 2019-08-20 DIAGNOSIS — M549 Dorsalgia, unspecified: Secondary | ICD-10-CM

## 2019-08-20 DIAGNOSIS — K929 Disease of digestive system, unspecified: Secondary | ICD-10-CM

## 2019-08-20 DIAGNOSIS — C50411 Malignant neoplasm of upper-outer quadrant of right female breast: Secondary | ICD-10-CM

## 2019-08-20 NOTE — Progress Notes
Date of Service: 08/20/2019    Subjective:             Hailey Stewart is a 69 y.o. female.    History of Present Illness  Patient returns to clinic for follow-up. Patient is doing well. Patient states that her pain has resolved. Patient feels that her breasts are as symmetrical as they can be. Symmetrical in a bra.  Allergies   Allergen Reactions   ? Garamycin [Gentamicin] ANAPHYLAXIS        Review of Systems   Constitutional: Negative.    HENT: Negative.    Eyes: Negative.    Respiratory: Negative.    Cardiovascular: Negative.    Gastrointestinal: Negative.    Endocrine: Negative.    Genitourinary: Negative.    Musculoskeletal: Negative.    Skin: Negative.    Allergic/Immunologic: Negative.    Neurological: Negative.    Hematological: Negative.    Psychiatric/Behavioral: Negative.          Objective:         ? acetaminophen SR (TYLENOL) 650 mg tablet Take one tablet by mouth every 6 hours as needed for Pain (Please take every 6 hours for three days; then only take as needed for pain.).   ? allopurinol (ZYLOPRIM) 100 mg tablet Take 100 mg by mouth daily. Take with food.   ? anastrozole (ARIMIDEX) 1 mg tablet TAKE 1 TABLET DAILY   ? ascorbic acid (vitamin C) (VITAMIN-C) 250 mg tablet Take 250 mg by mouth daily.   ? biotin 10,000 mcg TbDi Dissolve  by mouth daily.   ? Cyanocobalamin (VITAMIN B-12) 500 mcg lozg Place 500 mcg under tongue daily.   ? diazePAM (VALIUM) 5 mg tablet Take one tablet by mouth every 6 hours as needed for Anxiety.   ? Famotidine-Ca Carb-Mag Hydrox (PEPCID COMPLETE) 10-800-165 mg chew Chew 1 tablet by mouth twice daily.   ? gabapentin (NEURONTIN) 300 mg capsule Take 300 mg by mouth twice daily.   ? loperamide (IMODIUM A-D) 2 mg capsule Take 2 capsules by mouth initially, followed by 1 capsule by mouth after each loose stool up to a maximum of 8 tablets in 24 hours.   ? other medication Take 1 each by mouth daily. Bariatric Pal Multivitamin 1 daily  Bariatric Multivitamin + Minerals to include daily (minimum): Folic Acid (Folate) 400 mcg, Thiamine 12 mg, Vitamin A 5000 units, Vitamin E 15 IU, Vitamin K 90 mcg Copper 2 mg, and Zinc 11mg    ? pantoprazole DR (PROTONIX) 40 mg tablet Take 40 mg by mouth twice daily.   ? senna (SENOKOT) 8.6 mg tablet Take one tablet by mouth twice daily. Please take while on narcotic pain medication. Please hold for loose stools.   ? senna/docusate (SENOKOT-S) 8.6/50 mg tablet Take one tablet by mouth daily. (Patient taking differently: Take 1 tablet by mouth twice daily.)   ? simvastatin (ZOCOR) 20 mg tablet Take 20 mg by mouth at bedtime daily.   ? turmeric root extract 538 mg cap Take  by mouth daily.   ? vitamins, B complex tab Take 1 tablet by mouth daily.     Vitals:    08/20/19 1022   BP: 126/73   Pulse: 62   Temp: 36.1 ?C (97 ?F)   Weight: 46.7 kg (103 lb)   Height: 157.5 cm (62)     Body mass index is 18.84 kg/m?Marland Kitchen     Physical Exam  Vitals reviewed.   Constitutional:  Appearance: Normal appearance. She is well-groomed.   HENT:      Head: Normocephalic.   Pulmonary:      Effort: Pulmonary effort is normal.   Chest:      Comments: Bilateral breast scars well healed. Bilateral superior rippling   Skin:     General: Skin is warm and dry.   Neurological:      Mental Status: She is alert.   Psychiatric:         Mood and Affect: Mood normal.         Behavior: Behavior normal. Behavior is cooperative.         Thought Content: Thought content normal.         Judgment: Judgment normal.          Assessment and Plan:  Patient hx of right breast cancer.   Doing well - symmetrical in a bra  RTC PRN

## 2019-08-22 ENCOUNTER — Encounter: Admit: 2019-08-22 | Discharge: 2019-08-22 | Payer: MEDICARE

## 2019-09-02 ENCOUNTER — Encounter: Admit: 2019-09-02 | Discharge: 2019-09-02 | Payer: MEDICARE

## 2019-09-02 DIAGNOSIS — G2589 Other specified extrapyramidal and movement disorders: Secondary | ICD-10-CM

## 2019-09-02 DIAGNOSIS — M797 Fibromyalgia: Secondary | ICD-10-CM

## 2019-09-02 DIAGNOSIS — G4730 Sleep apnea, unspecified: Secondary | ICD-10-CM

## 2019-09-02 DIAGNOSIS — G248 Other dystonia: Secondary | ICD-10-CM

## 2019-09-02 DIAGNOSIS — M1990 Unspecified osteoarthritis, unspecified site: Secondary | ICD-10-CM

## 2019-09-02 DIAGNOSIS — M549 Dorsalgia, unspecified: Secondary | ICD-10-CM

## 2019-09-02 DIAGNOSIS — Z87898 Personal history of other specified conditions: Secondary | ICD-10-CM

## 2019-09-02 DIAGNOSIS — C50919 Malignant neoplasm of unspecified site of unspecified female breast: Secondary | ICD-10-CM

## 2019-09-02 DIAGNOSIS — C50411 Malignant neoplasm of upper-outer quadrant of right female breast: Secondary | ICD-10-CM

## 2019-09-02 DIAGNOSIS — H9190 Unspecified hearing loss, unspecified ear: Secondary | ICD-10-CM

## 2019-09-02 DIAGNOSIS — K929 Disease of digestive system, unspecified: Secondary | ICD-10-CM

## 2019-09-02 DIAGNOSIS — J45909 Unspecified asthma, uncomplicated: Secondary | ICD-10-CM

## 2019-09-02 DIAGNOSIS — M109 Gout, unspecified: Secondary | ICD-10-CM

## 2019-09-02 DIAGNOSIS — G629 Polyneuropathy, unspecified: Secondary | ICD-10-CM

## 2019-09-02 NOTE — Patient Instructions
Chest Wall Pain  Following your diagnosis of breast cancer, the disease process, chemotherapy, radiation, and/or surgery can do more to your body than fight cancer cells.  Soft tissues (muscles, tendons, ligaments, skin) can also be affected by the disease and treatment.  Commonly, the serratus anterior, pectoralis major and latissimus dorsi muscles can be impacted leading to pain and discomfort in the area.            Treatment  It is important to maintain your range of motion, strength and proper posture to limit your symptoms and prevent further worsening of symptoms and function.  This can be done with a physician or therapist directed home exercise program or formal physical therapy.  Additional management of side effects including lymphedema can also help minimize negative side effects.     Interventional treatment options may also be an option.  Botox (botulinum toxin) is used to treat disorders of muscles to help relax spastic or overactive/painful muscles.  These injections can help to reduce the pain in the muscles as described above by reducing the spasticity/tightness that can occur after cancer diagnosis and treatment.    Risks  Botox injections are relatively safe when performed by an experienced doctor. Possible side effects include:  ? Pain, swelling or bruising at the injection site  ? Headache or flu-like symptoms    Although very unlikely, it's possible for the effect of botulinum toxin to spread to other parts of the body and cause botulism-like signs and symptoms. Call your doctor right away if you notice any of these effects hours to weeks after receiving Botox:   ? Muscle weakness all over the body  ? Vision problems   ? Trouble speaking or swallowing  ? Trouble breathing  ? Loss of bladder control    Doctors generally recommend against using Botox when you're pregnant or breast-feeding.  Tell your doctor if you've received any type of botulinum toxin injections within the past four months to avoid risk of reaction to the medication.    What you can expect  Most people tolerate the injection discomfort well.  Your doctor uses a thin needle to inject tiny amounts of botulinum toxin into your skin or muscles. The number of injections needed depends on many factors, including the extent of the area being treated.  Typically, we will use 6-8 injections on each side depending on the treatment area including the pectoralis major, serratus anterior and/or latissimus dorsi     Results  Expect to resume your normal daily activities right after the procedure.  Limit any activity that might soak the area in water (swimming pools, bath tubs, hot tubs) for the next 3 days.  Botulinum toxin injections usually begin working a few days (5-7 days) after treatment. Depending on the problem being treated, the effect may last three months or longer. It is possible that you'll need regular follow-up injections to maintain the effect.    Dr. Bobbye Riggs  General Instructions:  ? How to reach me: Please send a MyChart message or leave a voicemail for [place] [place] at [phone]  ? How to get a medication refill: Please use the MyChart Refill request or contact your pharmacy directly to request medication refills.  ? How to receive your test results: If you have signed up for MyChart, you will receive your test results and messages from me this way. Otherwise, you will get a phone call or letter. If you are expecting results and have not heard from my office  within 2 weeks of your testing, please send a MyChart message or call my office or call .  ? Scheduling: Our scheduling phone number if you are a Cancer Center patient is [phone].  [facility] patient scheduling is [phone]. Appointment Reminders on your cell phone: Make sure we have your cell phone number, and Text [place] to [phone].  ? Support for many chronic illnesses is available through [facility]: [email or web address] or [phone].  ? For questions on nights, weekends or holidays, call the operator at [phone], and ask for the doctor on call for Physical Medicine and Rehab.

## 2019-09-02 NOTE — Progress Notes
ONCOLOGY REHAB HISTORY AND PHYSICAL    Chief Complaint   Patient presents with   ? Follow Up   Neuropathy    Subjective     HISTORY OF PRESENT ILLNESS:      Hailey Stewart is a pleasant 69 y.o. 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     Patient reports continued worsening of pain involving the right anterior chest and lateral chest wall.  Denies any inciting event or injury.  States that this is worsened since the time of her treatment.  Denies any radiation into her right upper extremity, although she does have some pain in the lateral aspect of her upper arm and shoulder.  Some increased pain with overhead activities or use.  Denies any particular alleviating factors.  She has been trying some over-the-counter medications including Tylenol does not note that this provides her with significant relief.  She has had prior physical therapy, and is continuing to work on her home exercise program.    Patient reports slowly worsening numbness/tingling/burning in bilateral 1st-5th digits in addition to all her toes.  Reports that the pain/symptoms are a 2-3/10 in intensity constantly and 4/10 at its worst. Patient reports that her numbness started during chemotherapy. Her dose was initially reduced and ultimately discontinued approximately two weeks ago due to worsening. Symptoms have persisted despite stopping chemo, and have even worsened a little bit. Patient endorses dropping objects but denies falls and near falls. No real alleviating factors.  Denies any radiation of pain.  Denies any associated stiffness, weakness.  Denies any change in bowel or bladder function.  Has tried to maintain home exercise program/activity level.  Previous treatments have included oral and topical over the counter treatments including menthol and anesthetic creams and Vitamin B complex. She is also on gabapentin 300mg  BID for fibromylagia but reports that she only takes this once per day. She does not do well with multi-day dosing.  Denies any previous interventions, acupuncture, therapy.       Medical History:   Diagnosis Date   ? Arthritis    ? Asthma     as a child   ? Back pain    ? Breast cancer (HCC)    ? Fibromyalgia    ? Gastrointestinal disorder     GERD   ? Gout    ? Hearing reduced    ? History of palpitations    ? Neuropathy    ? Sleep apnea     wears CPAP machine       Surgical History:   Procedure Laterality Date   ? TONSILLECTOMY  1964   ? BILATERAL SALPINGO-OOPHORECTOMY  1982   ? HX HYSTERECTOMY  1992   ? HX APPENDECTOMY  1992   ? HX CHOLECYSTECTOMY  2014   ? HX CATARACT REMOVAL Bilateral 2014   ? HX ACL RECONSTRUCTION Left 2015   ? FOOT SURGERY Left 2017   ? 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 RIGHT SKIN SPARING MASTECTOMY Right 08/15/2017    Performed by Ruthy Dick, DO at [facility] OR   ? IDENTIFICATION SENTINEL LYMPH NODE Right 08/15/2017    Performed by Ruthy Dick, DO at [facility] OR   ? INJECTION RADIOACTIVE TRACER FOR SENTINEL NODE IDENTIFICATION Right 08/15/2017    Performed by Ruthy Dick, DO at [facility] OR   ? SENTINEL  LYMPH NODE BIOPSY Right 08/15/2017    Performed by Ruthy Dick, DO at [facility] OR   ? RECONSTRUCTION BREAST WITH TISSUE EXPANDER AND SUBSEQUENT EXPANSION - IMMEDIATE/ DELAYED Right 08/15/2017    Performed by Marlinda Mike, MD at [facility] OR   ? IMPLANTATION BIOLOGIC IMPLANT FOR SOFT TISSUE REINFORCEMENT Right 08/15/2017    Performed by Marlinda Mike, MD at [facility] OR   ? PORT PLACEMENT Left 09/20/2017    Performed by Geralyn Flash, MD at [facility] OR   ? RIGHT TISSUE EXPANDER EXCHANGE Right 10/10/2017    Performed by Marlinda Mike, MD at [facility] OR   ? REPLACEMENT TISSUE EXPANDER WITH PERMANENT PROSTHESIS Right 05/27/2018    Performed by Marlinda Mike, MD at [facility]   ? INSERTION BREAST PROSTHESIS FOLLOWING MASTOPEXY/ MASTECTOMY/ IN RECONSTRUCTION - IMMEDIATE Left 05/27/2018    Performed by Marlinda Mike, MD at [facility] OR   ? REVISION RECONSTRUCTED BREAST Right 05/27/2018    Performed by Marlinda Mike, MD at [facility] OR   ? INSERTION BREAST PROSTHESIS FOLLOWING MASTOPEXY/ MASTECTOMY/ IN RECONSTRUCTION - IMMEDIATE Bilateral 09/24/2018    Performed by Marlinda Mike, MD at [facility] OR   ? FAT GRAFTING TO RIGHT BREAST S - 50 CC OR LESS - reduced service.  3.61ml harvested Right 09/24/2018    Performed by Marlinda Mike, MD at [facility] OR   ? REVISION RECONSTRUCTED BREAST Left 09/24/2018    Performed by Marlinda Mike, MD at [facility] OR   ? REMOVAL TUNNELED CENTRAL VENOUS ACCESS DEVICE INCLUDING PORT/ PUMP Left 09/24/2018    Performed by Marlinda Mike, MD at [facility] OR   ? INSERTION BREAST PROSTHESIS FOLLOWING MASTOPEXY/ MASTECTOMY/ IN RECONSTRUCTION - DELAYED Right 12/23/2018    Performed by Marlinda Mike, MD at [facility] OR   ? REVISION RECONSTRUCTED BREAST Left 12/23/2018    Performed by Marlinda Mike, MD at [facility] OR   ? REVISION RECONSTRUCTED BREAST - reduced service Bilateral 04/15/2019    Performed by Marlinda Mike, MD at [facility] OR   ? WRIST SURGERY Left 2000's       family history includes Arthritis-rheumatoid in her father and mother; Depression in her mother; Diabetes in her maternal grandmother and mother; Heart Disease in her maternal grandmother.    Social History     Socioeconomic History   ? Marital status: Married     Spouse name: Not on file   ? Number of children: Not on file   ? Years of education: Not on file   ? Highest education level: Not on file   Occupational History   ? Not on file   Tobacco Use   ? Smoking status: Never Smoker   ? Smokeless tobacco: Never Used   Vaping Use   ? Vaping Use: Never used   Substance and Sexual Activity   ? Alcohol use: Yes     Alcohol/week: 1.0 standard drinks     Types: 1 Glasses of wine per week     Comment: rare   ? Drug use: Never   ? Sexual activity: Not Currently   Other Topics Concern   ? Not on file   Social History Narrative   ? Not on file       Allergies   Allergen Reactions   ? Garamycin [Gentamicin] ANAPHYLAXIS       Current Outpatient Medications on File Prior to Visit   Medication Sig Dispense Refill   ? acetaminophen  SR (TYLENOL) 650 mg tablet Take one tablet by mouth every 6 hours as needed for Pain (Please take every 6 hours for three days; then only take as needed for pain.). 40 tablet 0   ? allopurinol (ZYLOPRIM) 100 mg tablet Take 100 mg by mouth daily. Take with food.     ? anastrozole (ARIMIDEX) 1 mg tablet TAKE 1 TABLET DAILY 90 tablet 3   ? ascorbic acid (vitamin C) (VITAMIN-C) 250 mg tablet Take 250 mg by mouth daily.     ? biotin 10,000 mcg TbDi Dissolve  by mouth daily.     ? Cyanocobalamin (VITAMIN B-12) 500 mcg lozg Place 500 mcg under tongue daily.     ? diazePAM (VALIUM) 5 mg tablet Take one tablet by mouth every 6 hours as needed for Anxiety. 10 tablet 0   ? Famotidine-Ca Carb-Mag Hydrox (PEPCID COMPLETE) 10-800-165 mg chew Chew 1 tablet by mouth twice daily.     ? gabapentin (NEURONTIN) 300 mg capsule Take 300 mg by mouth twice daily.     ? loperamide (IMODIUM A-D) 2 mg capsule Take 2 capsules by mouth initially, followed by 1 capsule by mouth after each loose stool up to a maximum of 8 tablets in 24 hours. 20 capsule 0   ? other medication Take 1 each by mouth daily. Bariatric Pal Multivitamin 1 daily  Bariatric Multivitamin + Minerals to include daily (minimum): Folic Acid (Folate) 400 mcg, Thiamine 12 mg, Vitamin A 5000 units, Vitamin E 15 IU, Vitamin K 90 mcg Copper 2 mg, and Zinc 11mg      ? pantoprazole DR (PROTONIX) 40 mg tablet Take 40 mg by mouth twice daily.     ? senna (SENOKOT) 8.6 mg tablet Take one tablet by mouth twice daily. Please take while on narcotic pain medication. Please hold for loose stools. 30 tablet 1   ? senna/docusate (SENOKOT-S) 8.6/50 mg tablet Take one tablet by mouth daily. (Patient taking differently: Take 1 tablet by mouth twice daily.) 15 tablet 1   ? simvastatin (ZOCOR) 20 mg tablet Take 20 mg by mouth at bedtime daily.     ? turmeric root extract 538 mg cap Take  by mouth daily.     ? vitamins, B complex tab Take 1 tablet by mouth daily.       No current facility-administered medications on file prior to visit.       Vitals:    09/02/19 1150   BP: 105/66   BP Source: Arm, Left Upper   Patient Position: Sitting   Pulse: 72   Resp: 16   Temp: 36.7 ?C (98 ?F)   TempSrc: Oral   SpO2: 99%   Weight: 46 kg (101 lb 6.4 oz)   Height: 156 cm (61.42)   PainSc: Five            Pain Score: Five    Body mass index is 18.9 kg/m?Marland Kitchen    Review of Systems  Complete 10 point ROS obtained and negative except positive fatigue and as stated above.         PHYSICAL EXAM:    Gen: Alert & Oriented X 3  HEENT: EOMI  Neck: Supple, no elevated JVP  Heart: Extremities well perfused  Lungs: non labored breathing  Abdomen: Soft, non-tender, non-distended  Skin: no gross lesions appreciated  Ext: purposeful movement of extremities   MS:   Root Right Left   Shoulder Abduction C5 5 5   Elbow Flexion C5 5 5  Elbow Extension C7 5 5   Wrist Extension C6 5 5   Finger Flexion C8 5 5   Finger Abduction T1 5 5     Adequate cervical range of motion with flexion, extension, side bending and rotation.  Spurlings negative. Full AROM with bilateral shoulder forward flexion, abduction, internal and external rotation.  No tenderness to palpation through the cervical spinous processes, SCM, splenius capitis, scalenes, paraspinal musculature, trapezius, rhomboids.  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  Induced muscle spasms with increased tone with provocative movements including isolated resistive strength testing techniques for the serratus anterior and pectorals major as well as the aforementioned subjective significant myalgia.      Neuro:  DTR's 1+ in bilateral biceps, triceps, brachioradialis   Upper Extremity Tone Normal   Upper Extremity Sensation Intact to light touch bilaterally       RADIOGRAPHIC EVALUATION:  No pertinent imaging available for review  Patient had labs, including B12, A1c, and TSH drawn 02/28/17 which were within normal limits.     IMPRESSION:    1. Acquired torsion dystonia  AMB REFERRAL TO PHYSICAL THERAPY   2. Malignant neoplasm of upper-outer quadrant of right breast in female, estrogen receptor positive (HCC)  AMB REFERRAL TO PHYSICAL THERAPY   3. Scapular dyskinesis  AMB REFERRAL TO PHYSICAL THERAPY         In summary, this is a 69 y.o. 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    We discussed her neuropathic symptoms, and she feels that at this time they are her tolerable enough that she does not need any additional management.  She has continued with her gabapentin twice daily, but is taking this for many years for her diagnosis of fibromyalgia.  We discussed additional medications and increasing the gabapentin, but at this time we will maintain her current medication management    We additionally discussed her chest wall pain and conservative and interventional treatments for this.  She has not had therapy modalities to address the spasticity through the pectoralis and serratus.  This may be most beneficial in the long-term relief and treatment of the symptoms.  We will plan to start this closer to home in [place], [place] at the [facility] in order to address the myofascial release through this musculature.  If this does not provide enough relief, may consider interventional treatment options including the use of botulinum toxin as needed.      Plan:  1. Medications:   Can continue current medications as prescribed.  We did discuss the use of Tylenol as needed.  Not to exceed 3000 mg/day.  Additional neuropathic medications were discussed, but at this time the patient would like to hold on any additional medication management for her neuropathy.  We previously tried Cymbalta, but the patient discontinued.  Can continue gabapentin 3 mg twice daily    2. Therapy: The patient start  in formalized physical therapy for additional therapeutic options.  We discussed the importance of home exercise program in order to address cognitive impairments, fatigue, and decreased energy.  Emphasized the importance of aerobic activity at least 3?5 times per week for at least 30 minutes at a time.    3. Interventions: none indicated at this time.  Integrative treatment options were discussed including desensitization techniques, therapy modalities, and integrative medicine techniques through physical therapy, chiropractic care and acupuncture.  We also discussed potential interventional treatment options for her acquired torsional dystonia including the use of botulinum toxin.  We will continue with conservative treatment options at this time    4. Diagnostic studies: none indicated at this time    5. Follow up: Patient will contact clinic in 2 weeks to provide update with initiation of therapy. Otherwise, we will plan to see the patient back for reevaluation in 12-16 weeks.  They were instructed to call or contact the clinic if they have any additional concerns, questions or change in symptoms.

## 2019-10-28 ENCOUNTER — Encounter: Admit: 2019-10-28 | Discharge: 2019-10-28 | Payer: MEDICARE

## 2020-01-26 ENCOUNTER — Encounter: Admit: 2020-01-26 | Discharge: 2020-01-26 | Payer: MEDICARE

## 2020-01-26 MED ORDER — DENOSUMAB 60 MG/ML SC SYRG
60 mg | Freq: Once | SUBCUTANEOUS | 0 refills
Start: 2020-01-26 — End: ?

## 2020-02-05 ENCOUNTER — Encounter: Admit: 2020-02-05 | Discharge: 2020-02-05 | Payer: MEDICARE

## 2020-02-05 DIAGNOSIS — M818 Other osteoporosis without current pathological fracture: Secondary | ICD-10-CM

## 2020-02-05 DIAGNOSIS — C50411 Malignant neoplasm of upper-outer quadrant of right female breast: Secondary | ICD-10-CM

## 2020-02-05 MED ORDER — DENOSUMAB 60 MG/ML SC SYRG
60 mg | Freq: Once | SUBCUTANEOUS | 0 refills | Status: CP
Start: 2020-02-05 — End: ?
  Administered 2020-02-05: 17:00:00 60 mg via SUBCUTANEOUS

## 2020-02-05 NOTE — Progress Notes
Patient received Prolia and tolerated without difficulty.  No pertinent changes since last assessment.

## 2020-04-15 NOTE — Patient Instructions
Thank you for coming to see us today.   Please call our office or send a message through MyChart if you have any questions or concerns.          Dr. Anne O'Dea and Michelle Prager, APRN  [phone] (Nurses: Quenna Doepke/Bailee Deviney/Gina Crimi)  [phone] (fax)    [facility] [facility]  [address]  [place], [place] [address]    [facility]  [address]  [place], [place] [address]

## 2020-04-20 ENCOUNTER — Encounter: Admit: 2020-04-20 | Discharge: 2020-04-20 | Payer: MEDICARE

## 2020-04-20 DIAGNOSIS — C50919 Malignant neoplasm of unspecified site of unspecified female breast: Secondary | ICD-10-CM

## 2020-04-20 DIAGNOSIS — M818 Other osteoporosis without current pathological fracture: Secondary | ICD-10-CM

## 2020-04-20 DIAGNOSIS — C50411 Malignant neoplasm of upper-outer quadrant of right female breast: Secondary | ICD-10-CM

## 2020-04-20 DIAGNOSIS — K929 Disease of digestive system, unspecified: Secondary | ICD-10-CM

## 2020-04-20 DIAGNOSIS — Z87898 Personal history of other specified conditions: Secondary | ICD-10-CM

## 2020-04-20 DIAGNOSIS — R922 Inconclusive mammogram: Secondary | ICD-10-CM

## 2020-04-20 DIAGNOSIS — M109 Gout, unspecified: Secondary | ICD-10-CM

## 2020-04-20 DIAGNOSIS — H9190 Unspecified hearing loss, unspecified ear: Secondary | ICD-10-CM

## 2020-04-20 DIAGNOSIS — G4730 Sleep apnea, unspecified: Secondary | ICD-10-CM

## 2020-04-20 DIAGNOSIS — J45909 Unspecified asthma, uncomplicated: Secondary | ICD-10-CM

## 2020-04-20 DIAGNOSIS — G629 Polyneuropathy, unspecified: Secondary | ICD-10-CM

## 2020-04-20 DIAGNOSIS — M1990 Unspecified osteoarthritis, unspecified site: Secondary | ICD-10-CM

## 2020-04-20 DIAGNOSIS — M549 Dorsalgia, unspecified: Secondary | ICD-10-CM

## 2020-04-20 DIAGNOSIS — Z79811 Long term (current) use of aromatase inhibitors: Secondary | ICD-10-CM

## 2020-04-20 DIAGNOSIS — M797 Fibromyalgia: Secondary | ICD-10-CM

## 2020-06-27 ENCOUNTER — Encounter: Admit: 2020-06-27 | Discharge: 2020-06-27 | Payer: MEDICARE

## 2020-06-27 MED ORDER — ANASTROZOLE 1 MG PO TAB
ORAL_TABLET | Freq: Every day | ORAL | 3 refills | 33.00000 days | Status: AC
Start: 2020-06-27 — End: ?

## 2020-07-13 ENCOUNTER — Encounter: Admit: 2020-07-13 | Discharge: 2020-07-13 | Payer: MEDICARE

## 2020-07-13 MED ORDER — DENOSUMAB 60 MG/ML SC SYRG
60 mg | Freq: Once | SUBCUTANEOUS | 0 refills
Start: 2020-07-13 — End: ?

## 2020-07-20 ENCOUNTER — Encounter: Admit: 2020-07-20 | Discharge: 2020-07-20 | Payer: MEDICARE

## 2020-07-20 DIAGNOSIS — C50411 Malignant neoplasm of upper-outer quadrant of right female breast: Secondary | ICD-10-CM

## 2020-07-20 DIAGNOSIS — M818 Other osteoporosis without current pathological fracture: Secondary | ICD-10-CM

## 2020-07-20 DIAGNOSIS — R922 Inconclusive mammogram: Secondary | ICD-10-CM

## 2020-07-20 MED ORDER — DENOSUMAB 60 MG/ML SC SYRG
60 mg | Freq: Once | SUBCUTANEOUS | 0 refills | Status: CP
Start: 2020-07-20 — End: ?
  Administered 2020-07-20: 19:00:00 60 mg via SUBCUTANEOUS

## 2020-07-20 NOTE — Progress Notes
Patient received prolia and tolerated without difficulty.  No pertinent changes since last assessment.  Pt denies need for AVS, left ambulatory at 1423.

## 2020-11-01 NOTE — Patient Instructions
Thank you for coming to see us today.   Please call our office or send a message through MyChart if you have any questions or concerns.          Dr. Anne O'Dea and Michelle Prager, APRN  [phone] (Nurses: Alvin Rubano/Bailee Deviney/Gina Crimi)  [phone] (fax)    [facility] [facility]  [address]  [place], [place] [address]    [facility]  [address]  [place], [place] [address]                                                                                                   Thank you for coming to see us today.   Please call our office or send a message through MyChart if you have any questions or concerns.          Dr. Anne O'Dea and Michelle Prager, APRN  [phone] (Nurses: Delron Comer/Bailee Deviney/Gina Crimi)  [phone] (fax)    [facility] [facility]  [address]  [place], [place] [address]    [facility]  [address]  [place], [place] [address]

## 2020-11-02 ENCOUNTER — Encounter: Admit: 2020-11-02 | Discharge: 2020-11-02 | Payer: MEDICARE

## 2020-11-02 DIAGNOSIS — Z79811 Long term (current) use of aromatase inhibitors: Secondary | ICD-10-CM

## 2020-11-02 DIAGNOSIS — Z1231 Encounter for screening mammogram for malignant neoplasm of breast: Secondary | ICD-10-CM

## 2020-11-02 DIAGNOSIS — C50411 Malignant neoplasm of upper-outer quadrant of right female breast: Secondary | ICD-10-CM

## 2020-12-27 ENCOUNTER — Encounter: Admit: 2020-12-27 | Discharge: 2020-12-27 | Payer: MEDICARE

## 2020-12-27 NOTE — Telephone Encounter
Patient called and lvm requesting a call back for appointment. This RN called and spoke with patient. She states she was unsure if she should have contacted our office or Dr. Remonia Richter office. She states she feels that both her breast implants are protruding and not upright as they were. Denies any concerns for infection. This RN encouraged patient to contact Dr. Billie Ruddy office as her concerns are related to her breast surgery reconstruction. She v/u and plans to do so.

## 2021-02-06 ENCOUNTER — Encounter: Admit: 2021-02-06 | Discharge: 2021-02-06 | Payer: MEDICARE

## 2021-02-06 DIAGNOSIS — M25562 Pain in left knee: Secondary | ICD-10-CM

## 2021-02-08 ENCOUNTER — Encounter: Admit: 2021-02-08 | Discharge: 2021-02-08 | Payer: MEDICARE

## 2021-02-08 ENCOUNTER — Ambulatory Visit: Admit: 2021-02-08 | Discharge: 2021-02-08 | Payer: MEDICARE

## 2021-02-08 DIAGNOSIS — M1990 Unspecified osteoarthritis, unspecified site: Secondary | ICD-10-CM

## 2021-02-08 DIAGNOSIS — H9190 Unspecified hearing loss, unspecified ear: Secondary | ICD-10-CM

## 2021-02-08 DIAGNOSIS — M25562 Pain in left knee: Secondary | ICD-10-CM

## 2021-02-08 DIAGNOSIS — K929 Disease of digestive system, unspecified: Secondary | ICD-10-CM

## 2021-02-08 DIAGNOSIS — M25362 Other instability, left knee: Secondary | ICD-10-CM

## 2021-02-08 DIAGNOSIS — M109 Gout, unspecified: Secondary | ICD-10-CM

## 2021-02-08 DIAGNOSIS — Z87898 Personal history of other specified conditions: Secondary | ICD-10-CM

## 2021-02-08 DIAGNOSIS — J45909 Unspecified asthma, uncomplicated: Secondary | ICD-10-CM

## 2021-02-08 DIAGNOSIS — M1732 Unilateral post-traumatic osteoarthritis, left knee: Secondary | ICD-10-CM

## 2021-02-08 DIAGNOSIS — C50919 Malignant neoplasm of unspecified site of unspecified female breast: Secondary | ICD-10-CM

## 2021-02-08 DIAGNOSIS — M549 Dorsalgia, unspecified: Secondary | ICD-10-CM

## 2021-02-08 DIAGNOSIS — G4730 Sleep apnea, unspecified: Secondary | ICD-10-CM

## 2021-02-08 DIAGNOSIS — M238X2 Other internal derangements of left knee: Secondary | ICD-10-CM

## 2021-02-08 DIAGNOSIS — M797 Fibromyalgia: Secondary | ICD-10-CM

## 2021-02-08 DIAGNOSIS — G629 Polyneuropathy, unspecified: Secondary | ICD-10-CM
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
## 2021-03-20 ENCOUNTER — Encounter: Admit: 2021-03-20 | Discharge: 2021-03-20 | Payer: MEDICARE

## 2021-03-20 NOTE — Telephone Encounter
Patient called in to schedule brace fitting. Informed our DME rep, Sam, and requested he call her to set up a time for fitting.

## 2021-04-10 ENCOUNTER — Encounter: Admit: 2021-04-10 | Discharge: 2021-04-10 | Payer: MEDICARE

## 2021-04-27 NOTE — Patient Instructions
Thank you for coming to see us today.   Please call our office or send a message through MyChart if you have any questions or concerns.          Dr. Anne O'Dea and Michelle Prager, APRN  [phone] (Nurses: Joselito Fieldhouse/Bailee Deviney/Gina Crimi)  [phone] (fax)    [facility] [facility]  [address]  [place], [place] [address]    [facility]  [address]  [place], [place] [address]

## 2021-05-01 ENCOUNTER — Encounter: Admit: 2021-05-01 | Discharge: 2021-05-01 | Payer: MEDICARE

## 2021-05-03 ENCOUNTER — Encounter: Admit: 2021-05-03 | Discharge: 2021-05-03 | Payer: MEDICARE

## 2021-05-03 ENCOUNTER — Ambulatory Visit: Admit: 2021-05-03 | Discharge: 2021-05-04 | Payer: MEDICARE

## 2021-05-03 DIAGNOSIS — M797 Fibromyalgia: Secondary | ICD-10-CM

## 2021-05-03 DIAGNOSIS — K929 Disease of digestive system, unspecified: Secondary | ICD-10-CM

## 2021-05-03 DIAGNOSIS — C50919 Malignant neoplasm of unspecified site of unspecified female breast: Secondary | ICD-10-CM

## 2021-05-03 DIAGNOSIS — Z79811 Long term (current) use of aromatase inhibitors: Secondary | ICD-10-CM

## 2021-05-03 DIAGNOSIS — G4730 Sleep apnea, unspecified: Secondary | ICD-10-CM

## 2021-05-03 DIAGNOSIS — Z87898 Personal history of other specified conditions: Secondary | ICD-10-CM

## 2021-05-03 DIAGNOSIS — M549 Dorsalgia, unspecified: Secondary | ICD-10-CM

## 2021-05-03 DIAGNOSIS — J45909 Unspecified asthma, uncomplicated: Secondary | ICD-10-CM

## 2021-05-03 DIAGNOSIS — H9190 Unspecified hearing loss, unspecified ear: Secondary | ICD-10-CM

## 2021-05-03 DIAGNOSIS — M109 Gout, unspecified: Secondary | ICD-10-CM

## 2021-05-03 DIAGNOSIS — M1990 Unspecified osteoarthritis, unspecified site: Secondary | ICD-10-CM

## 2021-05-03 DIAGNOSIS — G629 Polyneuropathy, unspecified: Secondary | ICD-10-CM

## 2021-05-03 DIAGNOSIS — M25562 Pain in left knee: Secondary | ICD-10-CM

## 2021-05-03 DIAGNOSIS — C50411 Malignant neoplasm of upper-outer quadrant of right female breast: Secondary | ICD-10-CM

## 2021-05-03 NOTE — Patient Instructions
Please do not hesitate to contact my office with any questions.    Dr. Lisa Vopat  - Sports Medicine  [facility] - Phone [phone] - Fax [phone]- Scheduling [phone]  [address], [address] - [place], [place] [address]    Chas Axel M.Ed, ATC, LAT - Athletic Trainer    Thank you for supporting our practice! Your feedback helps us deliver the highest quality of care.  Review us at: [email or web address]

## 2021-05-04 DIAGNOSIS — M238X2 Other internal derangements of left knee: Secondary | ICD-10-CM

## 2021-05-04 DIAGNOSIS — G8929 Other chronic pain: Secondary | ICD-10-CM

## 2021-05-04 DIAGNOSIS — M25362 Other instability, left knee: Secondary | ICD-10-CM

## 2021-05-04 DIAGNOSIS — M25462 Effusion, left knee: Secondary | ICD-10-CM

## 2021-05-08 ENCOUNTER — Encounter: Admit: 2021-05-08 | Discharge: 2021-05-08 | Payer: MEDICARE

## 2021-05-15 ENCOUNTER — Encounter: Admit: 2021-05-15 | Discharge: 2021-05-15 | Payer: MEDICARE

## 2021-05-17 ENCOUNTER — Encounter: Admit: 2021-05-17 | Discharge: 2021-05-17 | Payer: MEDICARE

## 2021-05-17 ENCOUNTER — Ambulatory Visit: Admit: 2021-05-17 | Discharge: 2021-05-18 | Payer: MEDICARE

## 2021-05-17 DIAGNOSIS — Z87898 Personal history of other specified conditions: Secondary | ICD-10-CM

## 2021-05-17 DIAGNOSIS — J45909 Unspecified asthma, uncomplicated: Secondary | ICD-10-CM

## 2021-05-17 DIAGNOSIS — G4730 Sleep apnea, unspecified: Secondary | ICD-10-CM

## 2021-05-17 DIAGNOSIS — M797 Fibromyalgia: Secondary | ICD-10-CM

## 2021-05-17 DIAGNOSIS — M109 Gout, unspecified: Secondary | ICD-10-CM

## 2021-05-17 DIAGNOSIS — M549 Dorsalgia, unspecified: Secondary | ICD-10-CM

## 2021-05-17 DIAGNOSIS — K929 Disease of digestive system, unspecified: Secondary | ICD-10-CM

## 2021-05-17 DIAGNOSIS — H9190 Unspecified hearing loss, unspecified ear: Secondary | ICD-10-CM

## 2021-05-17 DIAGNOSIS — C50919 Malignant neoplasm of unspecified site of unspecified female breast: Secondary | ICD-10-CM

## 2021-05-17 DIAGNOSIS — G629 Polyneuropathy, unspecified: Secondary | ICD-10-CM

## 2021-05-17 DIAGNOSIS — M1990 Unspecified osteoarthritis, unspecified site: Secondary | ICD-10-CM

## 2021-05-17 DIAGNOSIS — M25562 Pain in left knee: Secondary | ICD-10-CM

## 2021-05-17 NOTE — Patient Instructions
If you need to schedule or reschedule your MRI, CT, etc, please call radiology scheduling at [phone]. If you choose to go outside of the [facility] to get your MRI done, please contact facility of your choosing to set up exam.  Once exam complete, please notify this office so we can obtain your imaging and report.  If you go outside of the health system, Dr Mullen will put a comment in once he has a chance to review your report with recommendations.  Your copy of the full report will come from the facility that did your imaging. Please know that your insurance company MAY take up to 14 business days to approve your exam.       Please do not hesitate to contact my office with any questions.     Scott Mullen, MD FAAOS - Orthopedic Surgeon, Sports Medicine  Stephen Payne, APRN  [facility] - Phone [phone] - Fax [phone]   [address], [address] - [place], [place] [address]    Dr Mullen's clinic phone line: [phone] - this phone is not physically able to be answered every day, but voicemail is checked regularly and responded to as quickly as possible.    Preferred contact for non-urgent clinical questions: MyChart    For follow up appointments, please call [phone].    Chozen Latulippe, MS, LAT, ATC - Clinical Athletic Trainer  Jill Hallier, RN, BSN - Clinical Nurse Coordinator    Thank you for supporting our practice! Your feedback helps us deliver the highest quality care.  Review us at: [email or web address]

## 2021-05-17 NOTE — Progress Notes
Chief Complaint   Patient presents with   ? Left Knee - New To Provider       Chief complaint: Left knee ACL tear and instability.    HPI:  71 year old female comes to clinic today at the referral of Dr. Misty Stanley Vopat for evaluation of her left knee.  She reports her first knee injury was in the 1980s and she had a subsequent injury in the early 2000's.  She had a knee arthroscopy at which time her ACL was removed.  She has worn an ACL functional brace with some benefit.  Her most recent knee injury was in 2021.  An MRI was obtained which demonstrated significant bone contusions and a deficient ACL but her menisci were intact.  She continues to complain of instability of the knee.  She is extremely active and would like to discuss stabilization of her knee.    Physical Exam:    Healthy appearing 71 y.o. female in no acute distress.  Alert and oriented.  Constitutional:  Appropriate mood and affect.  Normal responses.  HEENT:  Head is atraumatic, normocephalic.  Eyes are anicteric, they track normally.  Mucous membranes are moist.  Neck:  Soft and supple.  Chest:  Normal excursion, nonlabored breathing.  Heart:  Palpable pulses distally.  Abdomen:  Nonobese, nondistended.    Examination of her left knee demonstrates well-healed incisions.  No erythema or signs of infection.  She has a chronic soft tissue injury of her medial side of her knee but her skin is intact.  She has a positive grade 2B Lachman exam as well as anterior drawer and grade 2 pivot shift.  Knee stable to varus valgus stress at 0 and 30 degrees.  Her McMurray's test are negative.  Patellofemoral crepitus and grind test are negative.  Medial lateral joint lines are nontender to palpation.    Right knee exam is benign    Imaging weightbearing x-rays were obtained in February which demonstrate well-maintained joint spaces, no fracture or loose bodies noted.    I reviewed her MRI from 2020 one of her left knee which demonstrates ACL deficiency.    Impression: Chronic ACL tear.    Plan:  We discussed her complaints, physical exam, and imaging findings today.  We discussed the natural history above.  We discussed operative and nonoperative treatment.  She complains of significant knee instability and would like surgical management.  We discussed autograft and allograft options and the risk associated with the 2.  She like to proceed with a left knee allograft anterior cruciate ligament reconstruction.The risks and benefits of the surgical procedure were discussed with the patient today including but not limited to the risk of bleeding, infection, anesthetic complication, deep vein thrombosis, pulmonary embolism, nerve injury, blood vessel injury, stiffness, persistent symptoms, pain, fracture, need for a revision surgery.  We discussed the convalesence and the postoperative rehab.  She states understanding the risks and would like to proceed with surgery.  We will schedule for a mutually convenient date.  All of her questions were answered today and she was comfortable with the plan at the end of the visit.        Review Of Symptoms:  A 14-point review of systems was performed and was positive as below and otherwise negative:  Review of Systems    Allergies:  Garamycin [gentamicin]    Current Medications:  ? acetaminophen SR (TYLENOL) 650 mg tablet Take one tablet by mouth every 6 hours as needed for Pain (Please  take every 6 hours for three days; then only take as needed for pain.).   ? allopurinol (ZYLOPRIM) 100 mg tablet Take one tablet by mouth daily. Take with food.   ? anastrozole (ARIMIDEX) 1 mg tablet TAKE 1 TABLET DAILY   ? ascorbic acid (vitamin C) (VITAMIN-C) 250 mg tablet Take one tablet by mouth daily.   ? biotin 10,000 mcg TbDi Dissolve  by mouth daily.   ? Cyanocobalamin 500 mcg lozg Place one lozenge under tongue daily.   ? diazePAM (VALIUM) 5 mg tablet Take one tablet by mouth every 6 hours as needed for Anxiety.   ? famotidine-Ca Carb-Mag Hydrox (PEPCID COMPLETE) 10-800-165 mg tablet Chew one tablet by mouth twice daily.   ? gabapentin (NEURONTIN) 300 mg capsule Take one capsule by mouth twice daily.   ? loperamide (IMODIUM A-D) 2 mg capsule Take 2 capsules by mouth initially, followed by 1 capsule by mouth after each loose stool up to a maximum of 8 tablets in 24 hours.   ? other medication Take one Dose by mouth daily. Bariatric Pal Multivitamin 1 daily  Bariatric Multivitamin + Minerals to include daily (minimum): Folic Acid (Folate) 400 mcg, Thiamine 12 mg, Vitamin A 5000 units, Vitamin E 15 IU, Vitamin K 90 mcg Copper 2 mg, and Zinc 11mg    ? pantoprazole DR (PROTONIX) 40 mg tablet Take one tablet by mouth twice daily.   ? senna (SENOKOT) 8.6 mg tablet Take one tablet by mouth twice daily. Please take while on narcotic pain medication. Please hold for loose stools.   ? simvastatin (ZOCOR) 20 mg tablet Take one tablet by mouth at bedtime daily.   ? turmeric root extract 538 mg cap Take  by mouth daily.   ? vitamins, B complex tab Take one tablet by mouth daily.       Past Medical History:  Medical History:   Diagnosis Date   ? Arthritis    ? Asthma     as a child   ? Back pain    ? Breast cancer (HCC)    ? Fibromyalgia    ? Gastrointestinal disorder     GERD   ? Gout    ? Hearing reduced    ? History of palpitations    ? Neuropathy    ? Sleep apnea     wears CPAP machine       Past Surgical History:  Surgical History:   Procedure Laterality Date   ? TONSILLECTOMY  1964   ? BILATERAL SALPINGO-OOPHORECTOMY  1982   ? HX HYSTERECTOMY  1992   ? HX APPENDECTOMY  1992   ? HX CHOLECYSTECTOMY  2014   ? HX CATARACT REMOVAL Bilateral 2014   ? HX ACL RECONSTRUCTION Left 2015   ? FOOT SURGERY Left 2017   ? 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 RIGHT SKIN SPARING MASTECTOMY Right 08/15/2017    Performed by Ruthy Dick, DO at [facility] OR   ? IDENTIFICATION SENTINEL LYMPH NODE Right 08/15/2017    Performed by Ruthy Dick, DO at [facility] OR   ? INJECTION RADIOACTIVE TRACER FOR SENTINEL NODE IDENTIFICATION Right 08/15/2017    Performed by Ruthy Dick, DO at [facility] OR   ? SENTINEL LYMPH NODE BIOPSY Right 08/15/2017    Performed by Ruthy Dick, DO at [facility] OR   ? RECONSTRUCTION BREAST WITH TISSUE EXPANDER AND SUBSEQUENT EXPANSION - IMMEDIATE/ DELAYED Right 08/15/2017    Performed by Marlinda Mike, MD at [facility] OR   ?  IMPLANTATION BIOLOGIC IMPLANT FOR SOFT TISSUE REINFORCEMENT Right 08/15/2017    Performed by Marlinda Mike, MD at [facility] OR   ? PORT PLACEMENT Left 09/20/2017    Performed by Geralyn Flash, MD at [facility] OR   ? RIGHT TISSUE EXPANDER EXCHANGE Right 10/10/2017    Performed by Marlinda Mike, MD at [facility] OR   ? REPLACEMENT TISSUE EXPANDER WITH PERMANENT PROSTHESIS Right 05/27/2018    Performed by Marlinda Mike, MD at [facility] OR   ? INSERTION BREAST PROSTHESIS FOLLOWING MASTOPEXY/ MASTECTOMY/ IN RECONSTRUCTION - IMMEDIATE Left 05/27/2018    Performed by Marlinda Mike, MD at [facility] OR   ? REVISION RECONSTRUCTED BREAST Right 05/27/2018    Performed by Marlinda Mike, MD at [facility] OR   ? INSERTION BREAST PROSTHESIS FOLLOWING MASTOPEXY/ MASTECTOMY/ IN RECONSTRUCTION - IMMEDIATE Bilateral 09/24/2018    Performed by Marlinda Mike, MD at [facility] OR   ? FAT GRAFTING TO RIGHT BREAST S - 50 CC OR LESS - reduced service.  3.80ml harvested Right 09/24/2018    Performed by Marlinda Mike, MD at [facility] OR   ? REVISION RECONSTRUCTED BREAST Left 09/24/2018    Performed by Marlinda Mike, MD at [facility] OR   ? REMOVAL TUNNELED CENTRAL VENOUS ACCESS DEVICE INCLUDING PORT/ PUMP Left 09/24/2018    Performed by Marlinda Mike, MD at [facility] OR   ? INSERTION BREAST PROSTHESIS FOLLOWING MASTOPEXY/ MASTECTOMY/ IN RECONSTRUCTION - DELAYED Right 12/23/2018    Performed by Marlinda Mike, MD at [facility] OR   ? REVISION RECONSTRUCTED BREAST Left 12/23/2018    Performed by Marlinda Mike, MD at [facility] OR   ? REVISION RECONSTRUCTED BREAST - reduced service Bilateral 04/15/2019    Performed by Marlinda Mike, MD at [facility] OR   ? WRIST SURGERY Left 2000's       Social History:  Social History     Tobacco Use   Smoking Status Never   Smokeless Tobacco Never   Vaping Use   ? Vaping Use: Never used     Social History     Substance and Sexual Activity   Drug Use Never     Social History     Substance and Sexual Activity   Alcohol Use Yes   ? Alcohol/week: 1.0 standard drink of alcohol   ? Types: 1 Glasses of wine per week    Comment: rare       Family History:  Family History   Problem Relation Age of Onset   ? Diabetes Mother    ? Arthritis-rheumatoid Mother    ? Depression Mother    ? Arthritis-rheumatoid Father    ? Diabetes Maternal Grandmother    ? Heart Disease Maternal Grandmother        Vitals:  Vitals:    05/17/21 1108   BP: 115/72   Pulse: 55   Resp: 16   SpO2: 100%   PainSc: Zero   Weight: 50.8 kg (112 lb)   Height: 157.5 cm (5' 2)     Body mass index is 20.49 kg/m?Marland Kitchen     ATTESTATION    I personally performed the E/M including history, physical exam, and MDM.    Staff name:  Yong Channel, MD Date:  05/17/2021          Yong Channel, MD    Portions of this noted may have been created using Dragon, a voice recognition software.  Please contact my office for any clarification of  documentation.    Please send a copy of office notes to the primary care physician and referring providers.

## 2021-05-17 NOTE — Progress Notes
MRI faxed to PCP as requested by patient. She plans to have imaging performed at [facility] 4-6 weeks before surgery.

## 2021-05-18 DIAGNOSIS — M238X2 Other internal derangements of left knee: Secondary | ICD-10-CM

## 2021-05-18 DIAGNOSIS — G8929 Other chronic pain: Secondary | ICD-10-CM

## 2021-05-18 DIAGNOSIS — M25362 Other instability, left knee: Secondary | ICD-10-CM

## 2021-05-18 DIAGNOSIS — M25462 Effusion, left knee: Secondary | ICD-10-CM

## 2021-06-22 ENCOUNTER — Encounter: Admit: 2021-06-22 | Discharge: 2021-06-22 | Payer: MEDICARE

## 2021-06-22 MED ORDER — ANASTROZOLE 1 MG PO TAB
ORAL_TABLET | ORAL | 3 refills | 33.00000 days | Status: AC
Start: 2021-06-22 — End: ?

## 2021-08-21 ENCOUNTER — Encounter: Admit: 2021-08-21 | Discharge: 2021-08-21 | Payer: MEDICARE

## 2021-08-28 ENCOUNTER — Encounter: Admit: 2021-08-28 | Discharge: 2021-08-28 | Payer: MEDICARE

## 2021-08-28 NOTE — Telephone Encounter
MRI order faxed to [place] radiology scheduling at [phone]. Requested patient call office to schedule preop appointment once MRI date is known. LVM to provide notification to patient.

## 2021-09-12 ENCOUNTER — Encounter: Admit: 2021-09-12 | Discharge: 2021-09-12 | Payer: MEDICARE

## 2021-09-12 ENCOUNTER — Ambulatory Visit: Admit: 2021-09-12 | Discharge: 2021-09-12 | Payer: MEDICARE

## 2021-09-12 DIAGNOSIS — M25362 Other instability, left knee: Secondary | ICD-10-CM

## 2021-09-12 DIAGNOSIS — M25562 Pain in left knee: Secondary | ICD-10-CM

## 2021-09-12 DIAGNOSIS — M238X2 Other internal derangements of left knee: Secondary | ICD-10-CM

## 2021-09-12 DIAGNOSIS — M899 Disorder of bone, unspecified: Secondary | ICD-10-CM

## 2021-09-12 MED ORDER — CEFAZOLIN INJ 1GM IVP
2 g | Freq: Once | INTRAVENOUS | 0 refills
Start: 2021-09-12 — End: ?

## 2021-09-13 ENCOUNTER — Encounter: Admit: 2021-09-13 | Discharge: 2021-09-13 | Payer: MEDICARE

## 2021-09-13 NOTE — Telephone Encounter
-----   Message from Belva Chimes, RN sent at 09/13/2021  8:15 AM CDT -----  Regarding: RE: anastrozole  Sounds great! Thank you so much!    Belva Chimes, RN, BSN  [phone]    ----- Message -----  From: Precious Haws, APRN-NP  Sent: 09/12/2021   4:24 PM CDT  To: Pecola Lawless, RN; Orlene Erm, RN; #  Subject: RE: anastrozole                                  There is no risk of blood clot on Arimidex or aromatase inhibitors (only Tamoxifen which is a SERM and she is not on that). No need to hold for surgeries. Would only hold day of surgery if anesthesia needs her to.   ----- Message -----  From: Pecola Lawless, RN  Sent: 09/12/2021   1:47 PM CDT  To: Orlene Erm, RN; Belva Chimes, RN; #  Subject: RE: anastrozole                                  Micki - Can you please review Jill's request below as we typically do not hold Arimidex for surgery?  ----- Message -----  From: Belva Chimes, RN  Sent: 09/12/2021   1:37 PM CDT  To: Pecola Lawless, RN; Orlene Erm, RN; #  Subject: anastrozole                                      Hello,    Ms Kretsch is scheduled for a left anterior cruciate ligament reconstruction with Dr Daryll Drown at the [facility] on 10.26.23. She will be non-weightbearing x 2 weeks, then will transition off crutches over the next week or so. Given the risks of blood clots after orthopedic surgeries while on anastrozole, is Dr Milta Deiters okay with her holding this medication prior to/after her procedure? If so, what are the instructions for holding? Dr Daryll Drown can prescribe Eliquis or xarelto postoperatively to help with DVT prevention if the medication needs to be restarted sooner rather than later postoperatively.    Thank you,    Belva Chimes, RN, BSN  [phone]

## 2021-09-27 IMAGING — MR MR knee LT wo con
6 of 8 series · 27 of 40 positions shown · non-contrast
Comparison: MRI 04/24/2019.

KNEELTWO
TECHNIQUE: Multiplanar multisequence imaging of the left knee was performed without intravenous or
intra-articular contrast.
CLINICAL INDICATION:
Chronic knee pain. History of previous meniscus repair. Current complaint of instability.

[Series 101: survey_fullfov_transversal · axial · 10.0mm · 1.84mm/px · z∈[-51,+51]mm · 2 of 7 slices shown]
[im 1/7]
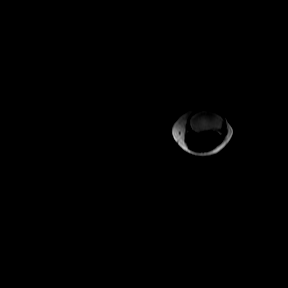
[im 7/7]
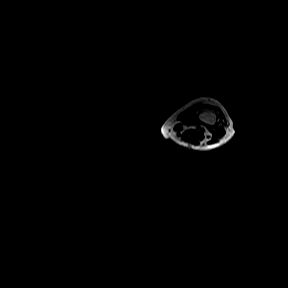

[Series 201: survey_left · axial · 10.0mm · 1.17mm/px · z∈[-20,+149]mm · 2 of 9 slices shown]
[im 1/9]
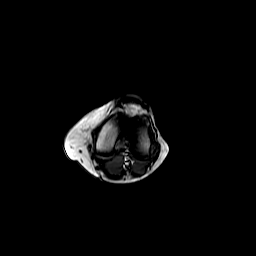
[im 9/9]
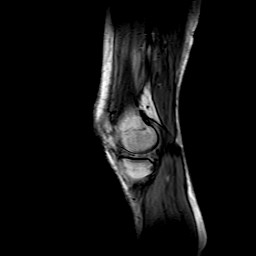

[Series 401: PD fat-sat · axial · 3.5mm · 0.31mm/px · z∈[-96,+34]mm · 7 of 32 slices shown (1 of 2)]
[im 1/32]
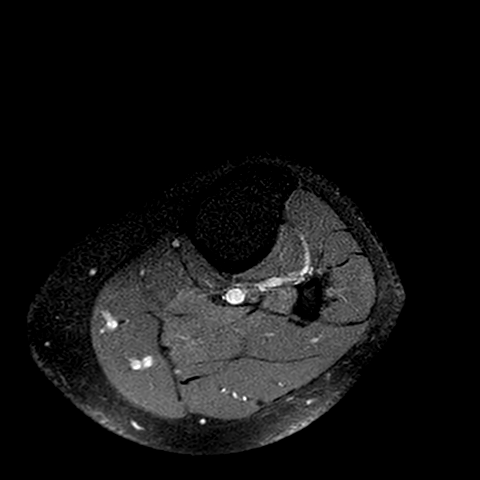
[im 6/32]
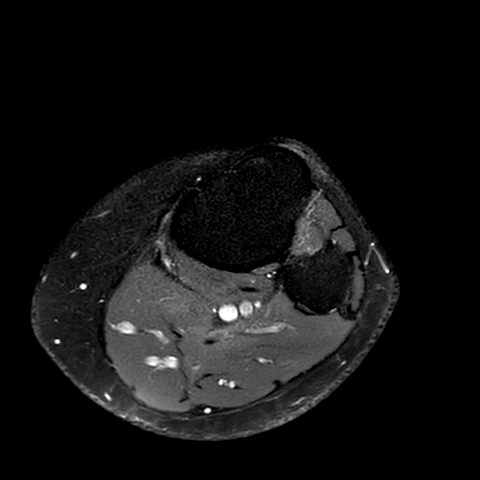
[im 11/32]
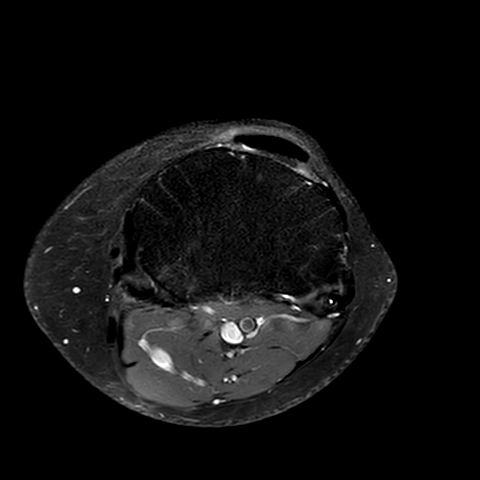
[im 16/32]
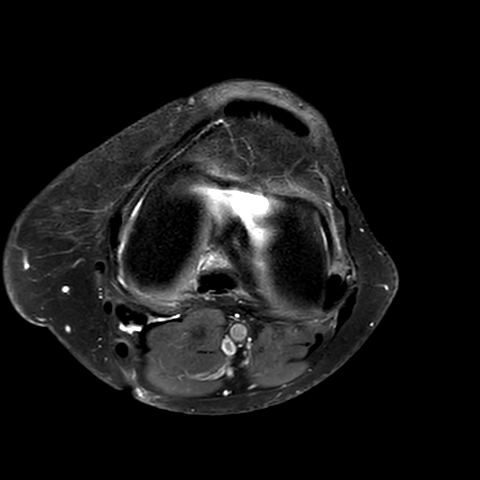
[im 21/32]
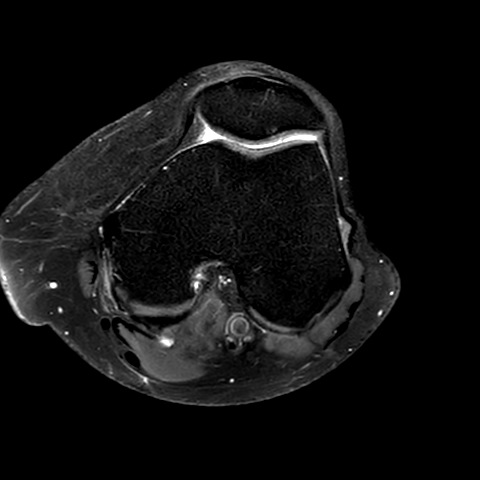
[im 26/32]
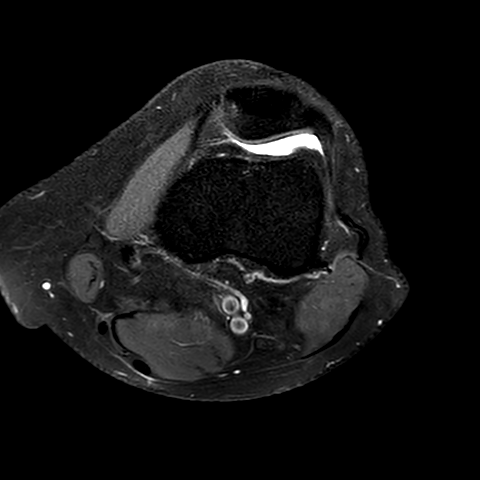
[im 32/32]
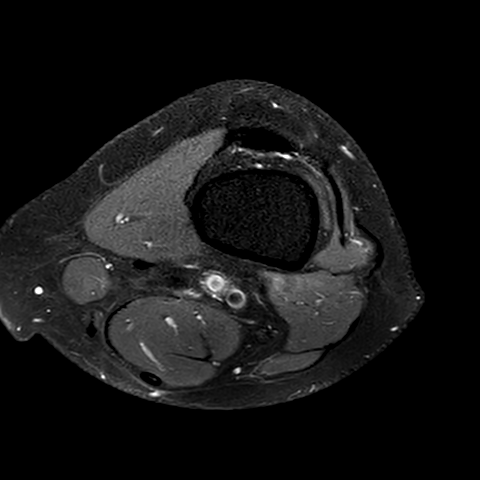

[Series 501: PD · coronal · 3.5mm · 0.35mm/px · 6 of 27 slices shown]
[im 1/27]
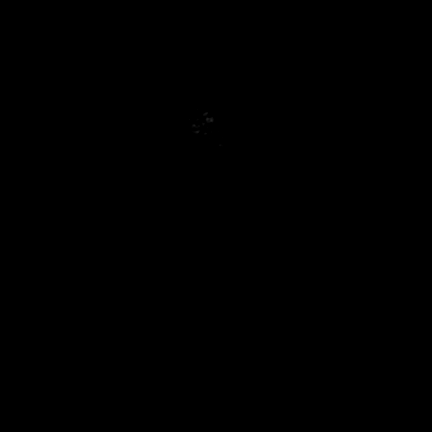
[im 6/27]
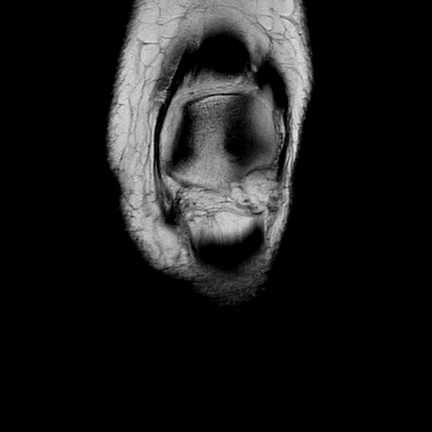
[im 11/27]
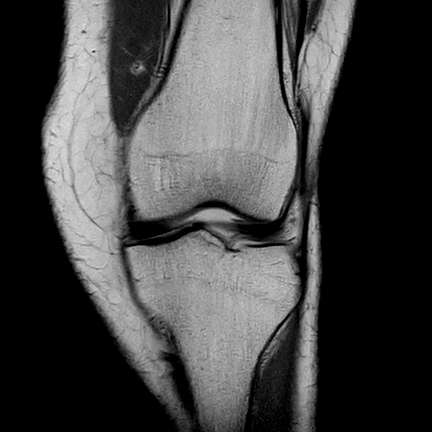
[im 16/27]
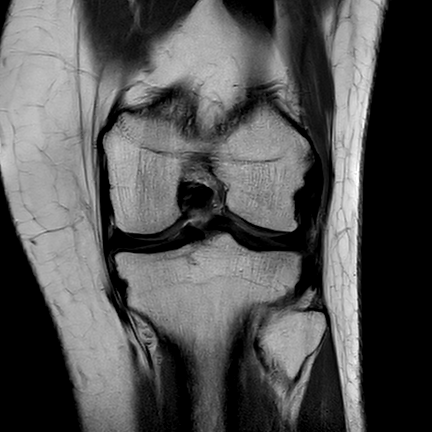
[im 21/27]
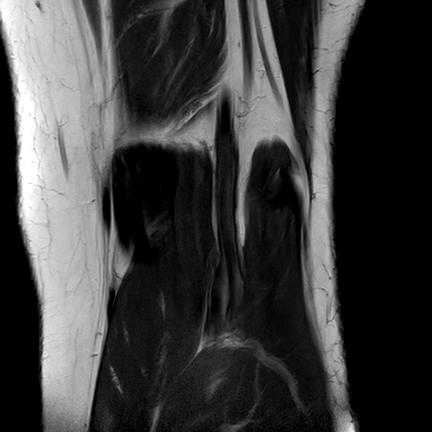
[im 27/27]
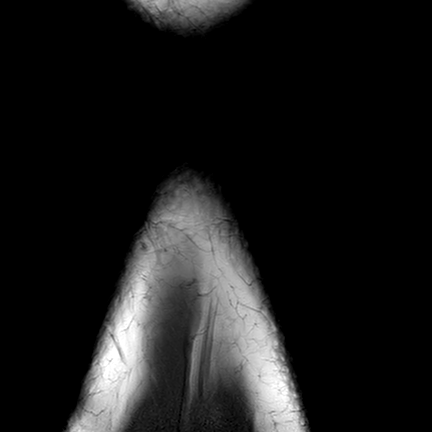

[Series 701: T1 · sagittal · 3.5mm · 0.35mm/px · 5 of 24 slices shown]
[im 1/24]
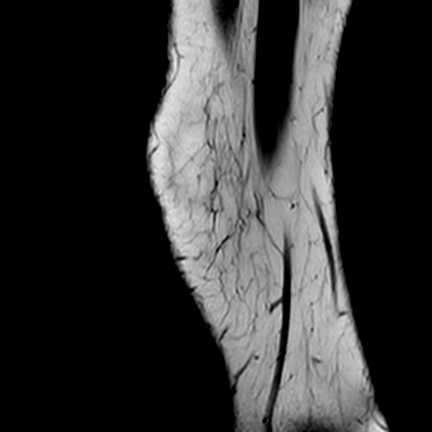
[im 6/24]
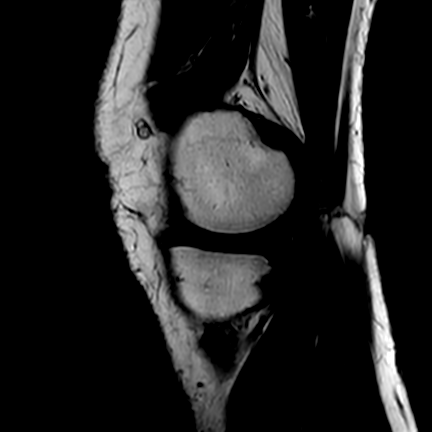
[im 12/24]
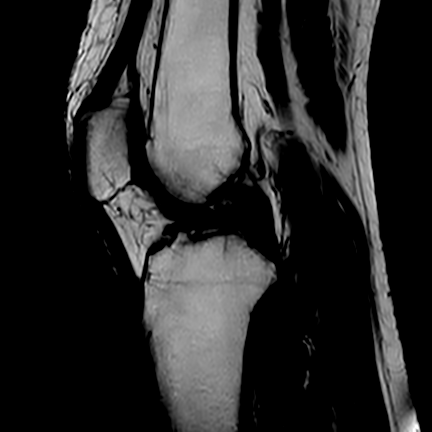
[im 18/24]
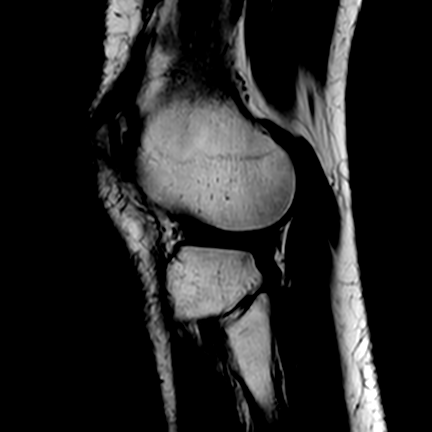
[im 24/24]
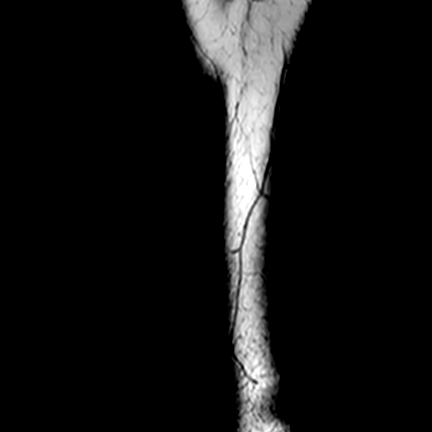

[Series 801: PD fat-sat · sagittal · 3.5mm · 0.39mm/px · 5 of 24 slices shown (2 of 2)]
[im 1/24]
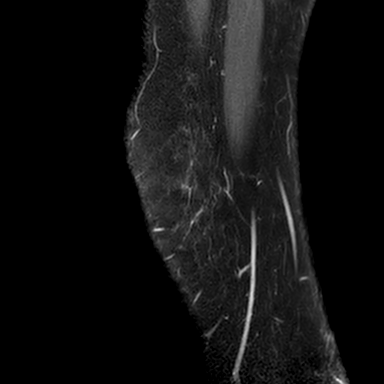
[im 6/24]
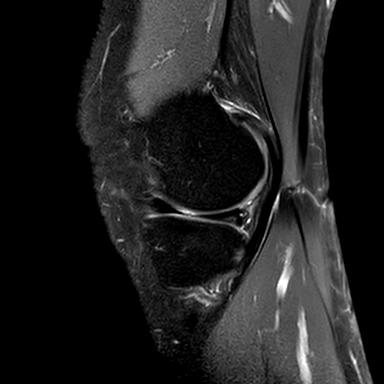
[im 12/24]
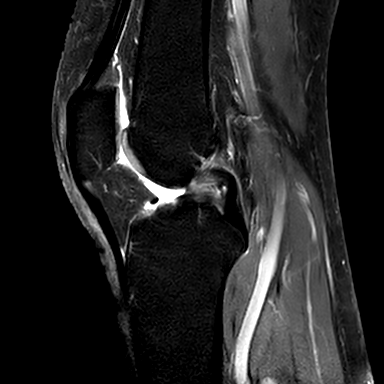
[im 18/24]
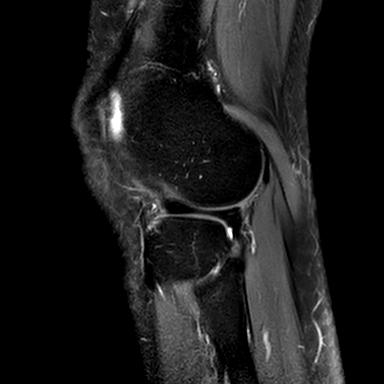
[im 24/24]
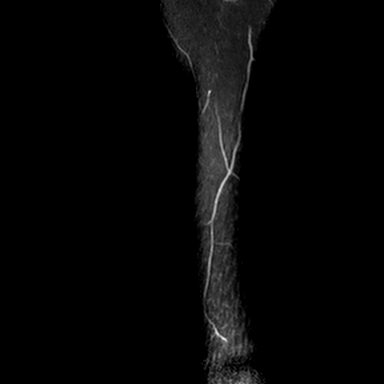

[27 of 40 positions shown; findings below may reference images not displayed]

FINDINGS: The posterior cruciate ligament is intact. The anterior cruciate ligament fibers are markedly
attenuated and there may be a few intact fibers although this is a chronic finding.
The medial and lateral collateral ligaments are intact.
There is abnormal signal in the posterior horn of the medial meniscus consistent with a nondisplaced
longitudinal horizontal oblique tear. This tear contacts the inferior surface (image 18 series 801).
This may be a sequela of a previous tear is a similar finding was present on the 9095 exam. There is
a small 3 mm parameniscal cyst posterior to the medial meniscus (image 20 series 801).
There is thinning of cartilage in the medial femorotibial compartment without full-thickness defect.
There is a longitudinal vertical tear along the peripheral margin of the posterior horn of the
lateral meniscus (image eight series 601). There is mild thinning of cartilage in the lateral
femorotibial compartment.
There is no evidence of a recent fracture.
There is no dislocation. There is no suspicious osseous lesion.
The patellofemoral alignment is maintained.
There is a full-thickness chondral fissure of the patellar cartilage at the median ridge (image 23
series 401). Additional partial-thickness chondral and fissuring is seen in the inferior lateral
patellar facet with a few tiny subchondral cysts of the patella.
The extensor mechanism is grossly intact.
There is a physiologic amount of joint fluid. There is a small popliteal cyst measuring 1 cm.
The muscles appear normal. There is no soft tissue mass. The neurovascular structures are intact.
IMPRESSION: 1. Degenerative changes of the knee which appear mostly mild. In the patellofemoral compartment
there is a full-thickness chondral fissure of the median patellar ridge.
2. Chronic appearing nondisplaced longitudinal horizontal oblique tear of the posterior horn of the
medial meniscus.
3. Longitudinal vertical tear of the posterior horn of the lateral meniscus which may also be a
chronic finding based on patient's previous injuries. Location may be associated with prior Wrisberg
rip tear.
4. No acute osseous abnormalities.
5. Chronically attenuated and torn anterior cruciate ligament. Questionable few fibers remaining
intact. This may be further correlated with exam for any signs of instability.

## 2021-10-04 ENCOUNTER — Encounter: Admit: 2021-10-04 | Discharge: 2021-10-04 | Payer: MEDICARE

## 2021-10-04 ENCOUNTER — Ambulatory Visit: Admit: 2021-10-04 | Discharge: 2021-10-04 | Payer: MEDICARE

## 2021-10-04 DIAGNOSIS — J45909 Unspecified asthma, uncomplicated: Secondary | ICD-10-CM

## 2021-10-04 DIAGNOSIS — M1990 Unspecified osteoarthritis, unspecified site: Secondary | ICD-10-CM

## 2021-10-04 DIAGNOSIS — Z87898 Personal history of other specified conditions: Secondary | ICD-10-CM

## 2021-10-04 DIAGNOSIS — M109 Gout, unspecified: Secondary | ICD-10-CM

## 2021-10-04 DIAGNOSIS — M238X2 Other internal derangements of left knee: Secondary | ICD-10-CM

## 2021-10-04 DIAGNOSIS — M549 Dorsalgia, unspecified: Secondary | ICD-10-CM

## 2021-10-04 DIAGNOSIS — G4730 Sleep apnea, unspecified: Secondary | ICD-10-CM

## 2021-10-04 DIAGNOSIS — G629 Polyneuropathy, unspecified: Secondary | ICD-10-CM

## 2021-10-04 DIAGNOSIS — C50919 Malignant neoplasm of unspecified site of unspecified female breast: Secondary | ICD-10-CM

## 2021-10-04 DIAGNOSIS — H9190 Unspecified hearing loss, unspecified ear: Secondary | ICD-10-CM

## 2021-10-04 DIAGNOSIS — K929 Disease of digestive system, unspecified: Secondary | ICD-10-CM

## 2021-10-04 DIAGNOSIS — M797 Fibromyalgia: Secondary | ICD-10-CM

## 2021-10-04 NOTE — Progress Notes
CC: Left knee pain    HPI:  Hailey Stewart, Hailey Stewart is a 71 y.o. female   Hailey Stewart is a 71 year old female returns to clinic today for follow up of left knee pain.     Ms. Adrianny Slisz reports that her knee is doing well; she mentions that it does shift and is unstable. She states that the brace does help and gives her stability. She does not want to wear the brace enough to keep her out of having surgery and does not want to wear the brace long term. She inquired about recovery after surgery. She states that she does not like to run.    She rides a motorcycle, bows, gardens, and mows.    Physical Exam:    Healthy appearing 71 y.o. female in no acute distress.  Alert and oriented.  Constitutional:  Appropriate mood and affect.  Normal responses.  HEENT:  Head is atraumatic, normocephalic.  Eyes are anicteric, they track normally.  Mucous membranes are moist.  Neck:  Soft and supple.  Chest:  Normal excursion, nonlabored breathing.  Heart:  Palpable pulses distally.  Abdomen:  Nonobese, nondistended.    Examination of her left knee is unchanged from last visit.      Imaging:  I reviewed her MRI from 09/27/2021.  This demonstrates a chronic ACL tear.  There is also a chronic tear of her medial meniscus that appears to have a horizontal oblique component to it.  There are some complex tearing associated.  Her lateral meniscus there is a chronic peripheral vertical tear within the posterior horn.  No bony edema or subchondral cystic changes noted.  She has a small fissure within her patella cartilage otherwise her chondral surfaces are well-maintained throughout her knee.  No bony edema or subchondral cystic changes noted.    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    Plan:  We discussed her complaints, her physical exam, and her imaging findings today. We discussed the natural history of the above. She has felt more stable in the functional knee brace but continues to complain of some sense of instability and states she does not want to wear this brace long term. We discussed surgical management again today which would include a left knee arthroscopy with anterior cruciate ligament reconstruction using allograft and possible medial and lateral meniscus repairs versus partial meniscectomy. She states that she would like to proceed.    The risks and benefits of the surgical procedure were discussed with the patient today including but not limited to the risk of bleeding, infection, anesthetic complication, deep vein thrombosis, pulmonary embolism, nerve injury, blood vessel injury, stiffness, persistent symptoms, pain, fracture, need for a revision surgery.  We discussed the convalesence and the postoperative rehab.  She states understanding the risks and would like to proceed with surgery.  We will schedule for a mutually convenient date.  All of her questions were answered today and she was comfortable with the plan at the end of the visit.      BP 112/72  - Pulse 69  - Ht 157.5 cm (5' 2)  - Wt 50.8 kg (112 lb)  - BMI 20.49 kg/m?     No orders of the defined types were placed in this encounter.      Current Medications:  ? acetaminophen SR (TYLENOL) 650 mg tablet Take one tablet by mouth every 6 hours as needed for Pain (Please take every 6 hours for  three days; then only take as needed for pain.).   ? allopurinol (ZYLOPRIM) 100 mg tablet Take one tablet by mouth daily. Take with food.   ? anastrozole (ARIMIDEX) 1 mg tablet TAKE 1 TABLET DAILY   ? ascorbic acid (vitamin C) (VITAMIN-C) 250 mg tablet Take one tablet by mouth daily.   ? biotin 10,000 mcg TbDi Dissolve  by mouth daily.   ? Cyanocobalamin 500 mcg lozg Place one lozenge under tongue daily.   ? diazePAM (VALIUM) 5 mg tablet Take one tablet by mouth every 6 hours as needed for Anxiety.   ? famotidine-Ca Carb-Mag Hydrox (PEPCID COMPLETE) 10-800-165 mg tablet Chew one tablet by mouth twice daily.   ? gabapentin (NEURONTIN) 300 mg capsule Take one capsule by mouth twice daily.   ? loperamide (IMODIUM A-D) 2 mg capsule Take 2 capsules by mouth initially, followed by 1 capsule by mouth after each loose stool up to a maximum of 8 tablets in 24 hours.   ? other medication Take one Dose by mouth daily. Bariatric Pal Multivitamin 1 daily  Bariatric Multivitamin + Minerals to include daily (minimum): Folic Acid (Folate) 400 mcg, Thiamine 12 mg, Vitamin A 5000 units, Vitamin E 15 IU, Vitamin K 90 mcg Copper 2 mg, and Zinc 11mg    ? pantoprazole DR (PROTONIX) 40 mg tablet Take one tablet by mouth twice daily.   ? senna (SENOKOT) 8.6 mg tablet Take one tablet by mouth twice daily. Please take while on narcotic pain medication. Please hold for loose stools.   ? simvastatin (ZOCOR) 20 mg tablet Take one tablet by mouth at bedtime daily.   ? turmeric root extract 538 mg cap Take  by mouth daily.   ? vitamins, B complex tab Take one tablet by mouth daily.     Allergies   Allergen Reactions   ? Garamycin [Gentamicin] ANAPHYLAXIS       This note may have been partially created using Dragon, a voice recognition software program.  Please feel free to contact my office for clarification of any documentation.    ATTESTATION  I personally performed the E/M including history, physical exam, and MDM.    Staff name:  Yong Channel, MD Date:  10/04/2021     Yong Channel, MD    Please send a copy of office notes to the primary care physician and referring providers.                     ATTESTATION  This visit was documented by Josefine Class via audio recorded by Yong Channel, MD, on October 04, 2021, 4:15 PM.

## 2021-10-05 ENCOUNTER — Encounter: Admit: 2021-10-05 | Discharge: 2021-10-05 | Payer: MEDICARE

## 2021-10-05 DIAGNOSIS — S83282D Other tear of lateral meniscus, current injury, left knee, subsequent encounter: Secondary | ICD-10-CM

## 2021-10-05 DIAGNOSIS — M25362 Other instability, left knee: Secondary | ICD-10-CM

## 2021-10-05 DIAGNOSIS — S83242D Other tear of medial meniscus, current injury, left knee, subsequent encounter: Secondary | ICD-10-CM

## 2021-10-05 DIAGNOSIS — M238X2 Other internal derangements of left knee: Secondary | ICD-10-CM

## 2021-10-05 NOTE — Progress Notes
Patient called to schedule surgery.  Date of surgery determined based on availability of both patient and Yong Channel, MD  The patient was scheduled for Left anterior cruciate ligament reconstruction, MMR vs partial medial meniscectomy, lateral meniscus repair partial lateral meniscectomy on 10.26.23 at [facility].  Patient was provided with Dr [facility] pre-surgery packet along with physical therapy prescription via MyChart.      Patient instructed to schedule POV for 14-15 days later with Dr Criselda Peaches or Dewayne Hatch, APRN.      Post-operative physical therapy: Patient plans to complete physical therapy at [facility] (F: [phone]).      She will be called with arrival date and time for day of surgery once scheduling is completed.  Questions answered and reassurance given.  Instructed to call [phone] for further questions or problems.  Verbalized understanding of the instructions as given.      Dr. Criselda Peaches and patient have agreed to schedule procedure as extended recovery after surgery: No    Vitals:  BP 112/72  - Pulse 69  - Ht 157.5 cm (5' 2)  - Wt 50.8 kg (112 lb)  - BMI 20.49 kg/m?       Deep Vein Thrombosis (DVT) Risk Assessment  *Use for all lower extremity procedures  1 point for each applicable item:     Total for section: 0  2 points for each applicable item:   Age 64-74   Surgery > 45 minutes  Total for section: 4  3 points for each applicable item:   Family history of VTE (1st degree relative) - mom  Total for section: 3  5 points for each applicable item:     Total for section: 0  Total Risk Factor Score: 7    DVT Risk Assessment Score  Points Risk     Recommendation     0 Very low VTE risk   Early and frequent ambulation    1-2 Low VTE risk    Mechanical prophylaxis    3-4 Moderate VTE risk and low bleed risk Pharmacologic prophylaxis  >/= 5 High VTE risk and low bleed risk  Mechanical AND pharmacologic prophylaxis    *DVT risk assessment scoring has been adapted from the Caprini Risk Assessment.    This risk stratification score is based on a validated DVT prophylaxis screening  tool, Thrombotic Risk Assessment: A Hybrid Approach. 2005. http://www.venousdisease.com/Publications/.  This is the recommended screening tool per the Quality Improvement Institute and the Accredication association for ambulatory health care, Inc.    **If limited weight bearing, patient will be given personalized instructions about aspirin use post-operatively for DVT Prophylaxis based on above risk assessment and weight bearing status.

## 2021-10-09 ENCOUNTER — Encounter: Admit: 2021-10-09 | Discharge: 2021-10-09 | Payer: MEDICARE

## 2021-10-09 DIAGNOSIS — M797 Fibromyalgia: Secondary | ICD-10-CM

## 2021-10-09 DIAGNOSIS — H9190 Unspecified hearing loss, unspecified ear: Secondary | ICD-10-CM

## 2021-10-09 DIAGNOSIS — Z87898 Personal history of other specified conditions: Secondary | ICD-10-CM

## 2021-10-09 DIAGNOSIS — K929 Disease of digestive system, unspecified: Secondary | ICD-10-CM

## 2021-10-09 DIAGNOSIS — M549 Dorsalgia, unspecified: Secondary | ICD-10-CM

## 2021-10-09 DIAGNOSIS — G629 Polyneuropathy, unspecified: Secondary | ICD-10-CM

## 2021-10-09 DIAGNOSIS — G4730 Sleep apnea, unspecified: Secondary | ICD-10-CM

## 2021-10-09 DIAGNOSIS — M1990 Unspecified osteoarthritis, unspecified site: Secondary | ICD-10-CM

## 2021-10-09 DIAGNOSIS — C50919 Malignant neoplasm of unspecified site of unspecified female breast: Secondary | ICD-10-CM

## 2021-10-09 DIAGNOSIS — J45909 Unspecified asthma, uncomplicated: Secondary | ICD-10-CM

## 2021-10-09 DIAGNOSIS — M109 Gout, unspecified: Secondary | ICD-10-CM

## 2021-11-01 ENCOUNTER — Encounter: Admit: 2021-11-01 | Discharge: 2021-11-01 | Payer: MEDICARE

## 2021-11-02 ENCOUNTER — Encounter: Admit: 2021-11-02 | Discharge: 2021-11-02 | Payer: MEDICARE

## 2021-11-02 ENCOUNTER — Ambulatory Visit: Admit: 2021-11-02 | Discharge: 2021-11-02 | Payer: MEDICARE

## 2021-11-02 DIAGNOSIS — Z87898 Personal history of other specified conditions: Secondary | ICD-10-CM

## 2021-11-02 DIAGNOSIS — M109 Gout, unspecified: Secondary | ICD-10-CM

## 2021-11-02 DIAGNOSIS — M797 Fibromyalgia: Secondary | ICD-10-CM

## 2021-11-02 DIAGNOSIS — H9190 Unspecified hearing loss, unspecified ear: Secondary | ICD-10-CM

## 2021-11-02 DIAGNOSIS — M1990 Unspecified osteoarthritis, unspecified site: Secondary | ICD-10-CM

## 2021-11-02 DIAGNOSIS — M549 Dorsalgia, unspecified: Secondary | ICD-10-CM

## 2021-11-02 DIAGNOSIS — J45909 Unspecified asthma, uncomplicated: Secondary | ICD-10-CM

## 2021-11-02 DIAGNOSIS — G629 Polyneuropathy, unspecified: Secondary | ICD-10-CM

## 2021-11-02 DIAGNOSIS — C50919 Malignant neoplasm of unspecified site of unspecified female breast: Secondary | ICD-10-CM

## 2021-11-02 DIAGNOSIS — G4730 Sleep apnea, unspecified: Secondary | ICD-10-CM

## 2021-11-02 DIAGNOSIS — K929 Disease of digestive system, unspecified: Secondary | ICD-10-CM

## 2021-11-02 MED ORDER — ONDANSETRON HCL (PF) 4 MG/2 ML IJ SOLN
INTRAVENOUS | 0 refills | Status: DC
Start: 2021-11-02 — End: 2021-11-02

## 2021-11-02 MED ORDER — LIDOCAINE (PF) 200 MG/10 ML (2 %) IJ SYRG
INTRAVENOUS | 0 refills | Status: DC
Start: 2021-11-02 — End: 2021-11-02

## 2021-11-02 MED ORDER — FENTANYL CITRATE (PF) 50 MCG/ML IJ SOLN
INTRAVENOUS | 0 refills | Status: DC
Start: 2021-11-02 — End: 2021-11-02

## 2021-11-02 MED ORDER — PROPOFOL 10 MG/ML IV EMUL 100 ML (INFUSION)(AM)(OR)
INTRAVENOUS | 0 refills | Status: DC
Start: 2021-11-02 — End: 2021-11-02
  Administered 2021-11-02: 19:00:00 75 ug/kg/min via INTRAVENOUS

## 2021-11-02 MED ORDER — DEXAMETHASONE SODIUM PHOSPHATE 4 MG/ML IJ SOLN
INTRAVENOUS | 0 refills | Status: DC
Start: 2021-11-02 — End: 2021-11-02

## 2021-11-02 MED ORDER — MIDAZOLAM 1 MG/ML IJ SOLN
INTRAVENOUS | 0 refills | Status: DC
Start: 2021-11-02 — End: 2021-11-02

## 2021-11-02 MED ORDER — PROPOFOL INJ 10 MG/ML IV VIAL
INTRAVENOUS | 0 refills | Status: DC
Start: 2021-11-02 — End: 2021-11-02

## 2021-11-02 MED ORDER — EPHEDRINE SULFATE 50 MG/ML IV SOLN
INTRAVENOUS | 0 refills | Status: DC
Start: 2021-11-02 — End: 2021-11-02

## 2021-11-02 MED ADMIN — FENTANYL CITRATE (PF) 50 MCG/ML IJ SOLN [3037]: 25 ug | INTRAVENOUS | @ 22:00:00 | Stop: 2021-11-03 | NDC 00641602701

## 2021-11-02 MED ADMIN — CEFAZOLIN INJ 1GM IVP [210319]: 2 g | INTRAVENOUS | @ 19:00:00 | Stop: 2021-11-02 | NDC 00143992490

## 2021-11-02 MED ADMIN — TRAMADOL 50 MG PO TAB [14632]: 50 mg | ORAL | @ 21:00:00 | Stop: 2021-11-02 | NDC 00904717961

## 2021-11-02 MED ADMIN — ACETAMINOPHEN 500 MG PO TAB [102]: 1000 mg | ORAL | @ 18:00:00 | Stop: 2021-11-02 | NDC 00904673061

## 2021-11-02 MED ADMIN — LACTATED RINGERS IV SOLP [4318]: 1000.000 mL | INTRAVENOUS | @ 21:00:00 | Stop: 2021-11-03 | NDC 00338011704

## 2021-11-02 MED ADMIN — LIDOCAINE (PF) 10 MG/ML (1 %) IJ SOLN [95838]: 0.2 mL | INTRAMUSCULAR | @ 17:00:00 | Stop: 2021-11-03 | NDC 54029337209

## 2021-11-02 MED ADMIN — HALOPERIDOL LACTATE 5 MG/ML IJ SOLN [3584]: 1 mg | INTRAVENOUS | @ 23:00:00 | Stop: 2021-11-02 | NDC 63323047400

## 2021-11-02 MED ADMIN — LACTATED RINGERS IV SOLP [4318]: 1000.000 mL | INTRAVENOUS | @ 17:00:00 | Stop: 2021-11-03 | NDC 00338011704

## 2021-11-02 MED ADMIN — FENTANYL CITRATE (PF) 50 MCG/ML IJ SOLN [3037]: 25 ug | INTRAVENOUS | @ 21:00:00 | Stop: 2021-11-03 | NDC 00641602701

## 2021-11-02 MED FILL — TRAMADOL 50 MG PO TAB: 50 mg | ORAL | 13 days supply | Qty: 50 | Fill #1 | Status: CP

## 2021-11-03 ENCOUNTER — Encounter: Admit: 2021-11-03 | Discharge: 2021-11-03 | Payer: MEDICARE

## 2021-11-03 DIAGNOSIS — S83242D Other tear of medial meniscus, current injury, left knee, subsequent encounter: Secondary | ICD-10-CM

## 2021-11-03 DIAGNOSIS — Z8249 Family history of ischemic heart disease and other diseases of the circulatory system: Secondary | ICD-10-CM

## 2021-11-03 DIAGNOSIS — M238X2 Other internal derangements of left knee: Secondary | ICD-10-CM

## 2021-11-03 DIAGNOSIS — Z9889 Other specified postprocedural states: Secondary | ICD-10-CM

## 2021-11-03 DIAGNOSIS — S83282D Other tear of lateral meniscus, current injury, left knee, subsequent encounter: Secondary | ICD-10-CM

## 2021-11-03 MED ORDER — APIXABAN 2.5 MG PO TAB
2.5 mg | ORAL_TABLET | Freq: Two times a day (BID) | ORAL | 0 refills | Status: AC
Start: 2021-11-03 — End: ?

## 2021-11-03 MED ORDER — DOCUSATE SODIUM 100 MG PO CAP
100 mg | ORAL_CAPSULE | Freq: Two times a day (BID) | ORAL | 1 refills | Status: AC
Start: 2021-11-03 — End: ?

## 2021-11-03 MED ORDER — RIVAROXABAN 10 MG PO TAB
10 mg | ORAL_TABLET | Freq: Every day | ORAL | 0 refills | 30.00000 days | Status: DC
Start: 2021-11-03 — End: 2021-11-03

## 2021-11-03 NOTE — Telephone Encounter
Due to personal history of bypass and family history of phlebitis/blood clot, Dr Daryll Drown ordered discontinuation of aspirin. Patient to start xarelto medication x 35 days. Educated patient on medication usage and discontinuation of aspirin. Discussed crutch use with patient. Handout sent to patient via mychart on crutch use. She has her first physical therapy appointment on Monday, Oct 30. Questions answered and reassurance given.  Instructed to call [phone] for further questions or problems.  Verbalized understanding of the instructions as given.

## 2021-11-04 ENCOUNTER — Encounter: Admit: 2021-11-04 | Discharge: 2021-11-04 | Payer: MEDICARE

## 2021-11-06 ENCOUNTER — Encounter: Admit: 2021-11-06 | Discharge: 2021-11-06 | Payer: MEDICARE

## 2021-11-06 NOTE — Progress Notes
Operative report, PT orders and protocol sent to [facility] (F: [phone])

## 2021-11-07 ENCOUNTER — Encounter: Admit: 2021-11-07 | Discharge: 2021-11-07 | Payer: MEDICARE

## 2021-11-07 DIAGNOSIS — C50919 Malignant neoplasm of unspecified site of unspecified female breast: Secondary | ICD-10-CM

## 2021-11-07 DIAGNOSIS — G4730 Sleep apnea, unspecified: Secondary | ICD-10-CM

## 2021-11-07 DIAGNOSIS — M797 Fibromyalgia: Secondary | ICD-10-CM

## 2021-11-07 DIAGNOSIS — J45909 Unspecified asthma, uncomplicated: Secondary | ICD-10-CM

## 2021-11-07 DIAGNOSIS — Z87898 Personal history of other specified conditions: Secondary | ICD-10-CM

## 2021-11-07 DIAGNOSIS — K929 Disease of digestive system, unspecified: Secondary | ICD-10-CM

## 2021-11-07 DIAGNOSIS — G629 Polyneuropathy, unspecified: Secondary | ICD-10-CM

## 2021-11-07 DIAGNOSIS — M549 Dorsalgia, unspecified: Secondary | ICD-10-CM

## 2021-11-07 DIAGNOSIS — M109 Gout, unspecified: Secondary | ICD-10-CM

## 2021-11-07 DIAGNOSIS — H9190 Unspecified hearing loss, unspecified ear: Secondary | ICD-10-CM

## 2021-11-07 DIAGNOSIS — M1990 Unspecified osteoarthritis, unspecified site: Secondary | ICD-10-CM

## 2021-11-13 NOTE — Patient Instructions
Thank you for coming to see us today.   Please call our office or send a message through MyChart if you have any questions or concerns.          Dr. Anne O'Dea and Michelle Prager, APRN  [phone] (Nurses: Ayrabella Labombard/Bailee Deviney/Gina Crimi)  [phone] (fax)    [facility] Location  [address]  [place], [place] [address]    [facility]  [address]  [place], [place] [address]

## 2021-11-15 ENCOUNTER — Encounter: Admit: 2021-11-15 | Discharge: 2021-11-15 | Payer: MEDICARE

## 2021-11-15 ENCOUNTER — Ambulatory Visit: Admit: 2021-11-15 | Discharge: 2021-11-16 | Payer: MEDICARE

## 2021-11-15 DIAGNOSIS — D539 Nutritional anemia, unspecified: Secondary | ICD-10-CM

## 2021-11-15 DIAGNOSIS — M1990 Unspecified osteoarthritis, unspecified site: Secondary | ICD-10-CM

## 2021-11-15 DIAGNOSIS — J189 Pneumonia, unspecified organism: Secondary | ICD-10-CM

## 2021-11-15 DIAGNOSIS — H9190 Unspecified hearing loss, unspecified ear: Secondary | ICD-10-CM

## 2021-11-15 DIAGNOSIS — C50919 Malignant neoplasm of unspecified site of unspecified female breast: Secondary | ICD-10-CM

## 2021-11-15 DIAGNOSIS — M549 Dorsalgia, unspecified: Secondary | ICD-10-CM

## 2021-11-15 DIAGNOSIS — F419 Anxiety disorder, unspecified: Secondary | ICD-10-CM

## 2021-11-15 DIAGNOSIS — J45909 Unspecified asthma, uncomplicated: Secondary | ICD-10-CM

## 2021-11-15 DIAGNOSIS — C50411 Malignant neoplasm of upper-outer quadrant of right female breast: Secondary | ICD-10-CM

## 2021-11-15 DIAGNOSIS — M109 Gout, unspecified: Secondary | ICD-10-CM

## 2021-11-15 DIAGNOSIS — G629 Polyneuropathy, unspecified: Secondary | ICD-10-CM

## 2021-11-15 DIAGNOSIS — H269 Unspecified cataract: Secondary | ICD-10-CM

## 2021-11-15 DIAGNOSIS — G4730 Sleep apnea, unspecified: Secondary | ICD-10-CM

## 2021-11-15 DIAGNOSIS — H547 Unspecified visual loss: Secondary | ICD-10-CM

## 2021-11-15 DIAGNOSIS — G709 Myoneural disorder, unspecified: Secondary | ICD-10-CM

## 2021-11-15 DIAGNOSIS — M797 Fibromyalgia: Secondary | ICD-10-CM

## 2021-11-15 DIAGNOSIS — Z9289 Personal history of other medical treatment: Secondary | ICD-10-CM

## 2021-11-15 DIAGNOSIS — T7840XA Allergy, unspecified, initial encounter: Secondary | ICD-10-CM

## 2021-11-15 DIAGNOSIS — Z9889 Other specified postprocedural states: Secondary | ICD-10-CM

## 2021-11-15 DIAGNOSIS — Z79811 Long term (current) use of aromatase inhibitors: Secondary | ICD-10-CM

## 2021-11-15 DIAGNOSIS — Z87898 Personal history of other specified conditions: Secondary | ICD-10-CM

## 2021-11-15 DIAGNOSIS — K219 Gastro-esophageal reflux disease without esophagitis: Secondary | ICD-10-CM

## 2021-11-15 DIAGNOSIS — M818 Other osteoporosis without current pathological fracture: Secondary | ICD-10-CM

## 2021-11-15 DIAGNOSIS — S83282D Other tear of lateral meniscus, current injury, left knee, subsequent encounter: Secondary | ICD-10-CM

## 2021-11-15 DIAGNOSIS — K929 Disease of digestive system, unspecified: Secondary | ICD-10-CM

## 2021-11-15 DIAGNOSIS — M238X2 Other internal derangements of left knee: Secondary | ICD-10-CM

## 2021-11-15 NOTE — Patient Instructions
Please do not hesitate to contact my office with any questions.     Scott Mullen, MD FAAOS - Orthopedic Surgeon, Sports Medicine  Stephen Payne, APRN  [facility] - Phone [phone] - Fax [phone]   [address], [address] - [place], [place] [address]    Dr Mullen's clinic phone line: [phone] - this phone is not physically able to be answered every day, but voicemail is checked regularly and responded to as quickly as possible.    Preferred contact for non-urgent clinical questions: MyChart    For follow up appointments, please call [phone].    Bethany Rogers, MS, LAT, ATC - Clinical Athletic Trainer  Garnetta Fedrick, RN, BSN - Clinical Nurse Coordinator    Thank you for supporting our practice! Your feedback helps us deliver the highest quality care.  Review us at: [email or web address]

## 2021-11-15 NOTE — Progress Notes
Name: Hailey Stewart          MRN: [id number]      DOB: November 21, 1950      AGE: 71 y.o.   DATE OF SERVICE: 11/15/2021    Subjective:             Reason for Visit:  Follow Up     Cancer Staging   Malignant neoplasm of upper-outer quadrant of right breast in female, estrogen receptor positive   Staging form: Breast, AJCC 8th Edition  - Clinical stage from 07/18/2017: Stage IB (cT2, cN0, cM0, G2, ER+, PR+, HER2-) - Signed by Andrew Au, PA-C on 07/23/2017  - Pathologic stage from 08/15/2017: Stage IA (pT2(2), pN0(sn), cM0, G2, ER+, PR+, HER2-) - Signed by Andrew Au, PA-C on 08/23/2017    Oncology History    Hailey Stewart is a very pleasant 71 y.o.postmenopausal female who presented 07/24/17  for initial consultation regarding her recently diagnosed right breast cancer. She is accompanied by her family. Reported hot flashes, taking Amberen OTC. Voiced she is taking several supplements since having gastric bypass surgery.     Jylian presented to the [facility] 07/18/2017 for evaluation of right breast mass prior to biopsy.  Ms. Overdorf recently had gastric bypass surgery 03/20/17 and after losing 40 lbs she palpated a right breast lump. A suspicious mass was found in the right breast on the screening/tomo bilateral mammogram 07/04/2017 [facility]). A right breast ultrasound 07/05/17 [facility]) confirmed a right breast mass. Right diagnostic mammogram 07/18/17 ([place]) revealed an irregular mass and additional suspicious calcifications in the upper outer right breast. Right ultrasound 07/18/17 ([place]) revealed an irregular 3.3 cm right breast mass at 9:30 with suspicious pleomorphic calcifications with additional suspicious calcifications in the upper outer right breast. She underwent a right sono-guided core needle biopsy 07/18/17 ([place]) which revealed IDC, grade II at 9:30. Clinical stage IB; cT2, cN0, cM0. ER 94.7, PR 20.2, Her2 (0 + by IHC) with a Ki67 of 38.77%. Bilateral MRI 07/30/17 demonstrated a right mass and an additional satellite cancer  immediately posterior to the dominant mass. S/p right mastectomy, SLNB with Dr. Loreta Ave and TE with Dr. Fran Lowes on 08/15/17. Pathology revealed multifocal (2 foci) IDC, grade II with DCIS, grade III. Negative lymph nodes (0/3). Pathologic stage IA; pT2(2), pN0(sn), cM0.    Completed AC, Taxol. Taxol stopped early due to neuropathy.     Current Therapy: Arimidex started 03/2018. Prolia which she receives with her local primary care physician locally.    History of Present Illness    Hailey Stewart returns today in follow up on Arimidex. She is doing well. She is doing well with her medications. She is due for a Prolia injection, but she is getting it in [place]. She is scheduled to have a calcium test on 11/16/2021.    She had an ACL surgery and meniscus repair on 11/02/2021.        Review of Systems   Constitutional: Negative for activity change, appetite change, chills, fatigue, fever and unexpected weight change.   HENT: Positive for tinnitus (chronic). Negative for congestion, dental problem, mouth sores, rhinorrhea, sore throat and trouble swallowing.    Eyes: Negative for photophobia, itching and visual disturbance.   Respiratory: Positive for apnea (c-pap). Negative for cough, chest tightness, shortness of breath and wheezing.    Cardiovascular: Negative for chest pain, palpitations and leg swelling.   Gastrointestinal: Negative for abdominal distention, abdominal pain, blood in stool, constipation, diarrhea, nausea  and vomiting.   Genitourinary: Negative for decreased urine volume, difficulty urinating, dysuria, frequency and urgency.   Musculoskeletal: Positive for arthralgias (knee), myalgias (baseline) and neck pain (baseline). Negative for back pain (baseline), gait problem, joint swelling and neck stiffness.   Skin: Negative for rash.   Neurological: Positive for numbness. Negative for dizziness, weakness, light-headedness and headaches.   Hematological: Negative for adenopathy. Does not bruise/bleed easily.   Psychiatric/Behavioral: Negative for sleep disturbance. The patient is not nervous/anxious.      Medical History:   Diagnosis Date   ? Acid reflux     Cant remember been a while   ? Allergy     Sinisitus   ? Anxiety     On occasion, but not lately.   ? Arthritis    ? Asthma     as a child   ? Back pain    ? Breast cancer (HCC)    ? Cataract 2018    Removed now i think in 2017 or 2018 maybe   ? Fibromyalgia    ? Gastrointestinal disorder     GERD   ? GERD (gastroesophageal reflux disease)     Had gall bladder removed   ? Gout    ? Hearing reduced    ? History of blood transfusion 1983    When i had a hysterectomy   ? History of palpitations    ? Neuromuscular disorder (HCC)     I have fibermyalgia have had it for a while   ? Neuropathy    ? Pneumonia     As a baby   ? Sleep apnea     wears CPAP machine   ? Unspecified deficiency anemia     Once in a while. Not surw now.   ? Vision decreased     Bad eyes since probably 1960     Surgical History:   Procedure Laterality Date   ? TONSILLECTOMY  1964   ? BILATERAL SALPINGO-OOPHORECTOMY  1982   ? HX HYSTERECTOMY  1992   ? HX APPENDECTOMY  1992   ? HX CHOLECYSTECTOMY  2014   ? HX CATARACT REMOVAL Bilateral 2014   ? HX ACL RECONSTRUCTION Left 2015   ? FOOT SURGERY Left 2017   ? BREAST AUGMENTATION Bilateral 2019   ? 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 HX BREAST BIOPSY Right 07/18/2017    Malignant [place] Bx   ? RIGHT SKIN SPARING MASTECTOMY Right 08/15/2017    Performed by Ruthy Dick, DO at [facility] OR   ? IDENTIFICATION SENTINEL LYMPH NODE Right 08/15/2017    Performed by Ruthy Dick, DO at [facility] OR   ? INJECTION RADIOACTIVE TRACER FOR SENTINEL NODE IDENTIFICATION Right 08/15/2017    Performed by Ruthy Dick, DO at [facility] OR   ? SENTINEL LYMPH NODE BIOPSY Right 08/15/2017    Performed by Ruthy Dick, DO at [facility] OR   ? RECONSTRUCTION BREAST WITH TISSUE EXPANDER AND SUBSEQUENT EXPANSION - IMMEDIATE/ DELAYED Right 08/15/2017    Performed by Marlinda Mike, MD at [facility] OR   ? IMPLANTATION BIOLOGIC IMPLANT FOR SOFT TISSUE REINFORCEMENT Right 08/15/2017    Performed by Marlinda Mike, MD at [facility] OR   ? PORT PLACEMENT Left 09/20/2017    Performed by Geralyn Flash, MD at [facility] OR   ? RIGHT TISSUE EXPANDER EXCHANGE Right 10/10/2017    Performed by Marlinda Mike, MD at [facility] OR   ? REPLACEMENT TISSUE EXPANDER WITH PERMANENT PROSTHESIS Right 05/27/2018  Performed by Marlinda Mike, MD at [facility] OR   ? INSERTION BREAST PROSTHESIS FOLLOWING MASTOPEXY/ MASTECTOMY/ IN RECONSTRUCTION - IMMEDIATE Left 05/27/2018    Performed by Marlinda Mike, MD at [facility] OR   ? REVISION RECONSTRUCTED BREAST Right 05/27/2018    Performed by Marlinda Mike, MD at [facility] OR   ? INSERTION BREAST PROSTHESIS FOLLOWING MASTOPEXY/ MASTECTOMY/ IN RECONSTRUCTION - IMMEDIATE Bilateral 09/24/2018    Performed by Marlinda Mike, MD at [facility] OR   ? FAT GRAFTING TO RIGHT BREAST S - 50 CC OR LESS - reduced service.  3.11ml harvested Right 09/24/2018    Performed by Marlinda Mike, MD at [facility] OR   ? REVISION RECONSTRUCTED BREAST Left 09/24/2018    Performed by Marlinda Mike, MD at [facility] OR   ? REMOVAL TUNNELED CENTRAL VENOUS ACCESS DEVICE INCLUDING PORT/ PUMP Left 09/24/2018    Performed by Marlinda Mike, MD at [facility] OR   ? INSERTION BREAST PROSTHESIS FOLLOWING MASTOPEXY/ MASTECTOMY/ IN RECONSTRUCTION - DELAYED Right 12/23/2018    Performed by Marlinda Mike, MD at [facility] OR   ? REVISION RECONSTRUCTED BREAST Left 12/23/2018    Performed by Marlinda Mike, MD at [facility] OR   ? REVISION RECONSTRUCTED BREAST - reduced service Bilateral 04/15/2019    Performed by Marlinda Mike, MD at [facility] OR   ? ARTHROSCOPIC REPAIR/ AUGMENTATION/ RECONSTRUCTION ANTERIOR CRUCIATE LIGAMENT Left 11/02/2021    Performed by Talmadge Chad, MD at [facility] OR   ? ARTHROSCOPY KNEE WITH LATERAL MENISCUS REPAIR Left 11/02/2021    Performed by Talmadge Chad, MD at [facility] OR   ? BREAST SURGERY  2019    Cancer, I think since 2019   ? CHEMO CART      Actually after surgery ? COLONOSCOPY      Several times last one was through Dr Guss Bunde from [place].   ? COSMETIC SURGERY      Due to breast cancer. Also eye lid repair.   ? EYE SURGERY      Cateracts removed & eye lid repair   ? FRACTURE SURGERY      Broke wrist not sure when probable early 2000's   ? OVARY SURGERY  1983    Had a hysterectomy I think correct year.   ? TUNNELED VENOUS PORT PLACEMENT  2019    During chemo, but removed now.   ? WRIST SURGERY Left 2000's     Family History   Problem Relation Age of Onset   ? Diabetes Mother    ? Arthritis-rheumatoid Mother    ? Depression Mother    ? Arthritis-rheumatoid Father    ? Diabetes Maternal Grandmother    ? Heart Disease Maternal Grandmother      Social History     Socioeconomic History   ? Marital status: Married   Tobacco Use   ? Smoking status: Never   ? Smokeless tobacco: Never   Vaping Use   ? Vaping Use: Never used   Substance and Sexual Activity   ? Alcohol use: Yes     Alcohol/week: 1.0 standard drink of alcohol     Types: 1 Glasses of wine per week     Comment: Rarely use   ? Drug use: Never   ? Sexual activity: Not Currently     Objective:         ? acetaminophen (ACETAMINOPHEN EXTRA STRENGTH) 500 mg tablet Take two tablets by mouth every 8 hours. Max of 4,000  mg of acetaminophen in 24 hours.   ? allopurinol (ZYLOPRIM) 100 mg tablet Take one tablet by mouth daily. Take with food.   ? anastrozole (ARIMIDEX) 1 mg tablet TAKE 1 TABLET DAILY   ? apixaban (ELIQUIS) 2.5 mg tablet Take one tablet by mouth twice daily. Do not take before surgery.   ? ascorbic acid (vitamin C) (VITAMIN-C) 250 mg tablet Take one tablet by mouth daily.   ? biotin 10,000 mcg TbDi Dissolve  by mouth daily.   ? Cyanocobalamin 500 mcg lozg Place one lozenge under tongue daily.   ? diazePAM (VALIUM) 5 mg tablet Take one tablet by mouth every 6 hours as needed for Anxiety.   ? docusate (COLACE) 100 mg capsule Take one capsule by mouth twice daily.   ? famotidine-Ca Carb-Mag Hydrox (PEPCID COMPLETE) 10-800-165 mg tablet Chew one tablet by mouth twice daily.   ? gabapentin (NEURONTIN) 300 mg capsule Take one capsule by mouth twice daily.   ? loperamide (IMODIUM A-D) 2 mg capsule Take 2 capsules by mouth initially, followed by 1 capsule by mouth after each loose stool up to a maximum of 8 tablets in 24 hours.   ? other medication Take one Dose by mouth daily. Bariatric Pal Multivitamin 1 daily  Bariatric Multivitamin + Minerals to include daily (minimum): Folic Acid (Folate) 400 mcg, Thiamine 12 mg, Vitamin A 5000 units, Vitamin E 15 IU, Vitamin K 90 mcg Copper 2 mg, and Zinc 11mg    ? pantoprazole DR (PROTONIX) 40 mg tablet Take one tablet by mouth twice daily.   ? senna (SENOKOT) 8.6 mg tablet Take one tablet by mouth twice daily. Please take while on narcotic pain medication. Please hold for loose stools.   ? simvastatin (ZOCOR) 20 mg tablet Take one tablet by mouth at bedtime daily.   ? traMADoL (ULTRAM) 50 mg tablet Take one tablet by mouth every 6 hours as needed for Pain.   ? turmeric root extract 538 mg cap Take  by mouth daily.   ? vitamins, B complex tab Take one tablet by mouth daily.     Vitals:    11/15/21 0840   BP: 118/57   BP Source: Arm, Left Upper   Pulse: 60   Temp: 36.4 ?C (97.6 ?F)   Resp: 16   SpO2: 98%   TempSrc: Temporal   PainSc: Three   Weight: 49.4 kg (109 lb)     Body mass index is 19.94 kg/m?Marland Kitchen     Pain Score: Three  Pain Loc: Knee    Fatigue Scale: 1    Pain Addressed:  N/A    Patient Evaluated for a Clinical Trial: No treatment clinical trial available for this patient.     [place] Cooperative Oncology Group performance status is 1, Restricted in physically strenuous activity but ambulatory and able to carry out work of a light or sedentary nature, e.g., light house work, office work.     Physical Exam  Vitals reviewed.   Constitutional:       General: She is not in acute distress.     Appearance: Normal appearance. She is well-developed and normal weight. She is not diaphoretic. Comments: Pt seated in wheelchair due to recent ACL surgery.    HENT:      Head: Normocephalic and atraumatic.      Mouth/Throat:      Mouth: Mucous membranes are moist. No oral lesions.      Pharynx: No oropharyngeal exudate.   Eyes:  General: No scleral icterus.     Conjunctiva/sclera: Conjunctivae normal.      Pupils: Pupils are equal, round, and reactive to light.   Pulmonary:      Effort: Pulmonary effort is normal. No respiratory distress.   Chest:   Breasts:     Right: No bleeding, mass, skin change or tenderness.      Left: No bleeding, inverted nipple, mass, nipple discharge, skin change or tenderness.          Comments: Status right mastectomy with implant reconstruction.  Abdominal:      General: There is no distension.      Palpations: Abdomen is soft.      Tenderness: There is no abdominal tenderness. There is no guarding.   Musculoskeletal:         General: No swelling. Normal range of motion.      Cervical back: Normal range of motion and neck supple.   Lymphadenopathy:      Cervical: No cervical adenopathy.      Upper Body:      Right upper body: No supraclavicular or axillary adenopathy.      Left upper body: No supraclavicular or axillary adenopathy.   Skin:     General: Skin is warm and dry.      Coloration: Skin is not jaundiced or pale.   Neurological:      Mental Status: She is alert and oriented to person, place, and time.   Psychiatric:         Mood and Affect: Mood normal.         Behavior: Behavior normal.         Thought Content: Thought content normal.               Assessment and Plan:    The patient is a 71 y.o.  female with the following:    1.  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   2.  S/p right mastectomy, SLNB with Dr. Loreta Ave and TE with Dr. Fran Lowes on 08/15/17. Pathology revealed multifocal (2 foci) IDC, grade II with DCIS, grade III. Negative lymph nodes (0/3). Pathologic stage IA; pT2(2), pN0(sn), cM0. Oncotype Dx testing which showed a RS of 27.   3.  History of gastric bypass, pt unable to take steroids without a protective regimen of PPI plus carafate.   4.  Osteoporosis.      Patient is without evidence of disease at this time.    Continue Arimidex.    Last DEXA reviewed. Next DEXA due 04/2022.    Continue calcium, vitamin D, and weight bearing exercise.    Continue Prolia, which she has with her primary care physician.    We will update her mammogram 11/15/2021.    She was encouraged to call with any persistent symptoms, questions, or concerns.    Return to clinic in 6 months.    Dr. Dellia Cloud is the collaborating physician.    Terrance Mass , APRN-NP                      Total Time Today was 30 minutes in the following activities: Preparing to see the patient, Obtaining and/or reviewing separately obtained history, Performing a medically appropriate examination and/or evaluation, Counseling and educating the patient/family/caregiver, Ordering medications, tests, or procedures, Documenting clinical information in the electronic or other health  record and Independently interpreting results (not separately reported) and communicating results to the patient/family/caregiver      ATTESTATION  This visit was documented by [employer] via audio recorded by Macie Burows, APRN, on November 8th, 2023 at 9:20 AM.

## 2021-11-15 NOTE — Progress Notes
Chief Complaint   Patient presents with    Left Knee - Post-op       HISTORY OF PRESENT ILLNESS:     The patient, Hailey Stewart, presents for a follow-up visit after undergoing left knee ACL reconstruction and lateral meniscus repair on November 02, 2021. The patient is currently using crutches and a brace for support and is seeking clarification on the duration of their recovery process.    During the recovery period, the patient has been advised to keep their knee straight while wearing the brace. However, they are allowed to bend their knee when sitting at home, as long as they do not put weight on it. The patient is aware of the need to be cautious and avoid accidentally standing up without proper support.           FOCUSED ORTHOPEDIC EXAM:  Left knee:  Incisions intact, no erythema,  No swelling.  ROM 0-95    IMAGING:  I reviewed the arthroscopic images with the patient today.    ASSESSMENT AND PLAN:       Left Knee ACL Reconstruction and Lateral Meniscus Repair: Patient is in the early post-operative period following surgery on 11-02-2021.  -Continue with current wound care, removing steri-strips as they loosen.  -Progressive weight bearing, aiming for full weight bearing by five weeks post-op.  -Continue with knee brace for a total of six weeks post-op.  -Continue physical therapy twice a week, with protocol adjustments as needed.  -Return for follow-up in one month.         All questions were answered today.  They were comfortable with the plan at the end of the visit.      ATTESTATION    I personally performed the E/M including history, physical exam, and MDM.    Staff name:  Garrison Columbus, MD Date:  11/15/2021          Garrison Columbus, MD    Portions of this noted may have been created using a voice recognition software.  Please contact my office for any clarification of documentation.    Please send a copy of office notes to the primary care physician and referring providers.

## 2021-11-20 ENCOUNTER — Encounter: Admit: 2021-11-20 | Discharge: 2021-11-20 | Payer: MEDICARE

## 2021-11-20 DIAGNOSIS — C50411 Malignant neoplasm of upper-outer quadrant of right female breast: Secondary | ICD-10-CM

## 2021-11-20 DIAGNOSIS — Z1231 Encounter for screening mammogram for malignant neoplasm of breast: Secondary | ICD-10-CM

## 2021-11-22 ENCOUNTER — Encounter: Admit: 2021-11-22 | Discharge: 2021-11-22 | Payer: MEDICARE

## 2021-12-15 ENCOUNTER — Encounter: Admit: 2021-12-15 | Discharge: 2021-12-15 | Payer: MEDICARE

## 2021-12-15 ENCOUNTER — Ambulatory Visit: Admit: 2021-12-15 | Discharge: 2021-12-16 | Payer: MEDICARE

## 2021-12-15 DIAGNOSIS — M238X2 Other internal derangements of left knee: Secondary | ICD-10-CM

## 2021-12-15 DIAGNOSIS — Z87898 Personal history of other specified conditions: Secondary | ICD-10-CM

## 2021-12-15 DIAGNOSIS — D539 Nutritional anemia, unspecified: Secondary | ICD-10-CM

## 2021-12-15 DIAGNOSIS — Z9289 Personal history of other medical treatment: Secondary | ICD-10-CM

## 2021-12-15 DIAGNOSIS — H9190 Unspecified hearing loss, unspecified ear: Secondary | ICD-10-CM

## 2021-12-15 DIAGNOSIS — F419 Anxiety disorder, unspecified: Secondary | ICD-10-CM

## 2021-12-15 DIAGNOSIS — G4730 Sleep apnea, unspecified: Secondary | ICD-10-CM

## 2021-12-15 DIAGNOSIS — J189 Pneumonia, unspecified organism: Secondary | ICD-10-CM

## 2021-12-15 DIAGNOSIS — M109 Gout, unspecified: Secondary | ICD-10-CM

## 2021-12-15 DIAGNOSIS — K929 Disease of digestive system, unspecified: Secondary | ICD-10-CM

## 2021-12-15 DIAGNOSIS — C50919 Malignant neoplasm of unspecified site of unspecified female breast: Secondary | ICD-10-CM

## 2021-12-15 DIAGNOSIS — T7840XA Allergy, unspecified, initial encounter: Secondary | ICD-10-CM

## 2021-12-15 DIAGNOSIS — K219 Gastro-esophageal reflux disease without esophagitis: Secondary | ICD-10-CM

## 2021-12-15 DIAGNOSIS — S83282D Other tear of lateral meniscus, current injury, left knee, subsequent encounter: Secondary | ICD-10-CM

## 2021-12-15 DIAGNOSIS — G709 Myoneural disorder, unspecified: Secondary | ICD-10-CM

## 2021-12-15 DIAGNOSIS — J45909 Unspecified asthma, uncomplicated: Secondary | ICD-10-CM

## 2021-12-15 DIAGNOSIS — G629 Polyneuropathy, unspecified: Secondary | ICD-10-CM

## 2021-12-15 DIAGNOSIS — M549 Dorsalgia, unspecified: Secondary | ICD-10-CM

## 2021-12-15 DIAGNOSIS — M1990 Unspecified osteoarthritis, unspecified site: Secondary | ICD-10-CM

## 2021-12-15 DIAGNOSIS — Z9889 Other specified postprocedural states: Secondary | ICD-10-CM

## 2021-12-15 DIAGNOSIS — H269 Unspecified cataract: Secondary | ICD-10-CM

## 2021-12-15 DIAGNOSIS — M797 Fibromyalgia: Secondary | ICD-10-CM

## 2021-12-15 DIAGNOSIS — H547 Unspecified visual loss: Secondary | ICD-10-CM

## 2021-12-15 NOTE — Patient Instructions
Please do not hesitate to contact my office with any questions.     Scott Mullen, MD FAAOS - Orthopedic Surgeon, Sports Medicine  Stephen Payne, APRN  [facility] - Phone [phone] - Fax [phone]   [address], [address] - [place], [place] [address]    Dr Mullen's clinic phone line: [phone] - this phone is not physically able to be answered every day, but voicemail is checked regularly and responded to as quickly as possible.    Preferred contact for non-urgent clinical questions: MyChart    For follow up appointments, please call [phone].    Bethany Rogers, MS, LAT, ATC - Clinical Athletic Trainer  Fallyn Munnerlyn, RN, BSN - Clinical Nurse Coordinator    Thank you for supporting our practice! Your feedback helps us deliver the highest quality care.  Review us at: [email or web address]

## 2022-01-29 ENCOUNTER — Encounter: Admit: 2022-01-29 | Discharge: 2022-01-29 | Payer: MEDICARE

## 2022-02-03 ENCOUNTER — Encounter: Admit: 2022-02-03 | Discharge: 2022-02-03 | Payer: MEDICARE

## 2022-02-14 ENCOUNTER — Encounter: Admit: 2022-02-14 | Discharge: 2022-02-14 | Payer: MEDICARE

## 2022-02-14 ENCOUNTER — Ambulatory Visit: Admit: 2022-02-14 | Discharge: 2022-02-15 | Payer: MEDICARE

## 2022-02-14 DIAGNOSIS — Z9889 Other specified postprocedural states: Secondary | ICD-10-CM

## 2022-02-14 DIAGNOSIS — M797 Fibromyalgia: Secondary | ICD-10-CM

## 2022-02-14 DIAGNOSIS — S83282D Other tear of lateral meniscus, current injury, left knee, subsequent encounter: Secondary | ICD-10-CM

## 2022-02-14 DIAGNOSIS — T7840XA Allergy, unspecified, initial encounter: Secondary | ICD-10-CM

## 2022-02-14 DIAGNOSIS — C50919 Malignant neoplasm of unspecified site of unspecified female breast: Secondary | ICD-10-CM

## 2022-02-14 DIAGNOSIS — K929 Disease of digestive system, unspecified: Secondary | ICD-10-CM

## 2022-02-14 DIAGNOSIS — G629 Polyneuropathy, unspecified: Secondary | ICD-10-CM

## 2022-02-14 DIAGNOSIS — J45909 Unspecified asthma, uncomplicated: Secondary | ICD-10-CM

## 2022-02-14 DIAGNOSIS — Z87898 Personal history of other specified conditions: Secondary | ICD-10-CM

## 2022-02-14 DIAGNOSIS — G4730 Sleep apnea, unspecified: Secondary | ICD-10-CM

## 2022-02-14 DIAGNOSIS — D539 Nutritional anemia, unspecified: Secondary | ICD-10-CM

## 2022-02-14 DIAGNOSIS — H9190 Unspecified hearing loss, unspecified ear: Secondary | ICD-10-CM

## 2022-02-14 DIAGNOSIS — H547 Unspecified visual loss: Secondary | ICD-10-CM

## 2022-02-14 DIAGNOSIS — G709 Myoneural disorder, unspecified: Secondary | ICD-10-CM

## 2022-02-14 DIAGNOSIS — M1990 Unspecified osteoarthritis, unspecified site: Secondary | ICD-10-CM

## 2022-02-14 DIAGNOSIS — Z9289 Personal history of other medical treatment: Secondary | ICD-10-CM

## 2022-02-14 DIAGNOSIS — M238X2 Other internal derangements of left knee: Secondary | ICD-10-CM

## 2022-02-14 DIAGNOSIS — M549 Dorsalgia, unspecified: Secondary | ICD-10-CM

## 2022-02-14 DIAGNOSIS — F419 Anxiety disorder, unspecified: Secondary | ICD-10-CM

## 2022-02-14 DIAGNOSIS — H269 Unspecified cataract: Secondary | ICD-10-CM

## 2022-02-14 DIAGNOSIS — K219 Gastro-esophageal reflux disease without esophagitis: Secondary | ICD-10-CM

## 2022-02-14 DIAGNOSIS — J189 Pneumonia, unspecified organism: Secondary | ICD-10-CM

## 2022-02-14 DIAGNOSIS — M109 Gout, unspecified: Secondary | ICD-10-CM

## 2022-02-14 NOTE — Progress Notes
CC:  Status post Left lateral meniscus repair and anterior cruciate ligament reconstruction utilizing quad allograft on 11/02/21     HPI:  Hailey Stewart is a 72 y.o. female who presents today for routine 3 month post-operative follow up of her knee surgery.   She is doing well with minimal complaints.  She denies any new problems or concerns.      Physical Exam:    Healthy appearing 72 y.o. female in no acute distress.  Alert and oriented.  Constitutional:  Appropriate mood and affect.  Normal responses.  HEENT:  Head is atraumatic, normocephalic.  Eyes are anicteric, they track normally.  Mucous membranes are moist.  Neck:  Soft and supple.  Chest:  Normal excursion, nonlabored breathing.  Heart:  Palpable pulses distally.  Abdomen:  Nonobese, nondistended.    Left knee exam:  ROM 3-135 Actively.  Able to get full extension with light pressure on knee without significant discomfort.   Effusion:  Mild  Incisions clean and dry.  No erythema or induration.  The knee is stable to ligamentous exam.    Right knee exam:  ROM is normal.  This knee is stable to ligamentous exam.    Impression: 72 y.o. female 3 months status post the above surgery doing well.    Plan:  She may progress through the PT protocol as planned.  We discussed postoperative precautions moving forward.  We discussed laying prone with leg extended over edge of sofa or bed to help with complete extension resolution. She was provided with refill medications as listed below.  I will see her back in 6 weeks.  All of her questions were answered today.  She was comfortable with the plan at the end of the visit.        There were no vitals taken for this visit.    No orders of the defined types were placed in this encounter.      Current Medications:   acetaminophen (ACETAMINOPHEN EXTRA STRENGTH) 500 mg tablet Take two tablets by mouth every 8 hours. Max of 4,000 mg of acetaminophen in 24 hours.    allopurinol (ZYLOPRIM) 100 mg tablet Take one tablet by mouth daily. Take with food.    anastrozole (ARIMIDEX) 1 mg tablet TAKE 1 TABLET DAILY    apixaban (ELIQUIS) 2.5 mg tablet Take one tablet by mouth twice daily. Do not take before surgery.    ascorbic acid (vitamin C) (VITAMIN-C) 250 mg tablet Take one tablet by mouth daily.    biotin 10,000 mcg TbDi Dissolve  by mouth daily.    Cyanocobalamin 500 mcg lozg Place one lozenge under tongue daily.    diazePAM (VALIUM) 5 mg tablet Take one tablet by mouth every 6 hours as needed for Anxiety.    docusate (COLACE) 100 mg capsule Take one capsule by mouth twice daily.    famotidine-Ca Carb-Mag Hydrox (PEPCID COMPLETE) 10-800-165 mg tablet Chew one tablet by mouth twice daily.    gabapentin (NEURONTIN) 300 mg capsule Take one capsule by mouth twice daily.    loperamide (IMODIUM A-D) 2 mg capsule Take 2 capsules by mouth initially, followed by 1 capsule by mouth after each loose stool up to a maximum of 8 tablets in 24 hours.    other medication Take one Dose by mouth daily. Bariatric Pal Multivitamin 1 daily  Bariatric Multivitamin + Minerals to include daily (minimum): Folic Acid (Folate) 400 mcg, Thiamine 12 mg, Vitamin A 5000 units, Vitamin E 15 IU, Vitamin K 90  mcg Copper 2 mg, and Zinc 11mg     pantoprazole DR (PROTONIX) 40 mg tablet Take one tablet by mouth twice daily.    senna (SENOKOT) 8.6 mg tablet Take one tablet by mouth twice daily. Please take while on narcotic pain medication. Please hold for loose stools.    simvastatin (ZOCOR) 20 mg tablet Take one tablet by mouth at bedtime daily.    traMADoL (ULTRAM) 50 mg tablet Take one tablet by mouth every 6 hours as needed for Pain.    turmeric root extract 538 mg cap Take  by mouth daily.    vitamins, B complex tab Take one tablet by mouth daily.     Allergies   Allergen Reactions    Garamycin [Gentamicin] ANAPHYLAXIS       This note may have been partially created using Dragon, a voice recognition software program.  Please feel free to contact my office for clarification of any documentation.        Dewayne Hatch, APRN    Please send a copy of office notes to the primary care physician and referring providers.

## 2022-02-14 NOTE — Patient Instructions
Please do not hesitate to contact my office with any questions.     Scott Mullen, MD FAAOS - Orthopedic Surgeon, Sports Medicine  Stephen Payne, APRN  [facility] - Phone [phone] - Fax [phone]   [address], [address] - [place], [place] [address]    Dr Mullen's clinic phone line: [phone] - this phone is not physically able to be answered every day, but voicemail is checked regularly and responded to as quickly as possible.    Preferred contact for non-urgent clinical questions: MyChart    For follow up appointments, please call [phone].    Bethany Rogers, MS, LAT, ATC - Clinical Athletic Trainer  Connell Bognar, RN, BSN - Clinical Nurse Coordinator    Thank you for supporting our practice! Your feedback helps us deliver the highest quality care.  Review us at: [email or web address]

## 2022-04-11 ENCOUNTER — Ambulatory Visit: Admit: 2022-04-11 | Discharge: 2022-04-12 | Payer: MEDICARE

## 2022-04-11 ENCOUNTER — Encounter: Admit: 2022-04-11 | Discharge: 2022-04-11 | Payer: MEDICARE

## 2022-04-11 DIAGNOSIS — Z9289 Personal history of other medical treatment: Secondary | ICD-10-CM

## 2022-04-11 DIAGNOSIS — M797 Fibromyalgia: Secondary | ICD-10-CM

## 2022-04-11 DIAGNOSIS — M549 Dorsalgia, unspecified: Secondary | ICD-10-CM

## 2022-04-11 DIAGNOSIS — G709 Myoneural disorder, unspecified: Secondary | ICD-10-CM

## 2022-04-11 DIAGNOSIS — G629 Polyneuropathy, unspecified: Secondary | ICD-10-CM

## 2022-04-11 DIAGNOSIS — D539 Nutritional anemia, unspecified: Secondary | ICD-10-CM

## 2022-04-11 DIAGNOSIS — Z9889 Other specified postprocedural states: Secondary | ICD-10-CM

## 2022-04-11 DIAGNOSIS — M1990 Unspecified osteoarthritis, unspecified site: Secondary | ICD-10-CM

## 2022-04-11 DIAGNOSIS — J45909 Unspecified asthma, uncomplicated: Secondary | ICD-10-CM

## 2022-04-11 DIAGNOSIS — F419 Anxiety disorder, unspecified: Secondary | ICD-10-CM

## 2022-04-11 DIAGNOSIS — G4730 Sleep apnea, unspecified: Secondary | ICD-10-CM

## 2022-04-11 DIAGNOSIS — K929 Disease of digestive system, unspecified: Secondary | ICD-10-CM

## 2022-04-11 DIAGNOSIS — K219 Gastro-esophageal reflux disease without esophagitis: Secondary | ICD-10-CM

## 2022-04-11 DIAGNOSIS — C50919 Malignant neoplasm of unspecified site of unspecified female breast: Secondary | ICD-10-CM

## 2022-04-11 DIAGNOSIS — S83282D Other tear of lateral meniscus, current injury, left knee, subsequent encounter: Secondary | ICD-10-CM

## 2022-04-11 DIAGNOSIS — T7840XA Allergy, unspecified, initial encounter: Secondary | ICD-10-CM

## 2022-04-11 DIAGNOSIS — H9190 Unspecified hearing loss, unspecified ear: Secondary | ICD-10-CM

## 2022-04-11 DIAGNOSIS — H547 Unspecified visual loss: Secondary | ICD-10-CM

## 2022-04-11 DIAGNOSIS — H269 Unspecified cataract: Secondary | ICD-10-CM

## 2022-04-11 DIAGNOSIS — M109 Gout, unspecified: Secondary | ICD-10-CM

## 2022-04-11 DIAGNOSIS — Z87898 Personal history of other specified conditions: Secondary | ICD-10-CM

## 2022-04-11 DIAGNOSIS — J189 Pneumonia, unspecified organism: Secondary | ICD-10-CM

## 2022-04-11 NOTE — Patient Instructions
Please do not hesitate to contact my office with any questions.     Scott Mullen, MD FAAOS - Orthopedic Surgeon, Sports Medicine  Stephen Payne, APRN  [facility] - Phone [phone] - Fax [phone]   [address], [address] - [place], [place] [address]    Dr Mullen's clinic phone line: [phone] - this phone is not physically able to be answered every day, but voicemail is checked regularly and responded to as quickly as possible.    Preferred contact for non-urgent clinical questions: MyChart    For follow up appointments, please call [phone].    Bethany Rogers, MS, LAT, ATC - Clinical Athletic Trainer  Paticia Moster, RN, BSN - Clinical Nurse Coordinator    Thank you for supporting our practice! Your feedback helps us deliver the highest quality care.  Review us at: [email or web address]

## 2022-05-11 NOTE — Patient Instructions
Thank you for coming to see us today.   Please call our office or send a message through MyChart if you have any questions or concerns.          Dr. Anne O'Dea and Michelle Prager, APRN  [phone] (Nurses: Jamariya Davidoff/Bailee Deviney/Gina Crimi)  [phone] (fax)    [facility] Location  [address]  [place], [place] [address]    [facility]  [address]  [place], [place] [address]

## 2022-05-16 ENCOUNTER — Encounter: Admit: 2022-05-16 | Discharge: 2022-05-16 | Payer: MEDICARE

## 2022-05-16 ENCOUNTER — Ambulatory Visit: Admit: 2022-05-16 | Discharge: 2022-05-16 | Payer: MEDICARE

## 2022-05-16 DIAGNOSIS — Z9289 Personal history of other medical treatment: Secondary | ICD-10-CM

## 2022-05-16 DIAGNOSIS — M549 Dorsalgia, unspecified: Secondary | ICD-10-CM

## 2022-05-16 DIAGNOSIS — C50411 Malignant neoplasm of upper-outer quadrant of right female breast: Secondary | ICD-10-CM

## 2022-05-16 DIAGNOSIS — J189 Pneumonia, unspecified organism: Secondary | ICD-10-CM

## 2022-05-16 DIAGNOSIS — K219 Gastro-esophageal reflux disease without esophagitis: Secondary | ICD-10-CM

## 2022-05-16 DIAGNOSIS — G4730 Sleep apnea, unspecified: Secondary | ICD-10-CM

## 2022-05-16 DIAGNOSIS — J45909 Unspecified asthma, uncomplicated: Secondary | ICD-10-CM

## 2022-05-16 DIAGNOSIS — G629 Polyneuropathy, unspecified: Secondary | ICD-10-CM

## 2022-05-16 DIAGNOSIS — H547 Unspecified visual loss: Secondary | ICD-10-CM

## 2022-05-16 DIAGNOSIS — G709 Myoneural disorder, unspecified: Secondary | ICD-10-CM

## 2022-05-16 DIAGNOSIS — H9190 Unspecified hearing loss, unspecified ear: Secondary | ICD-10-CM

## 2022-05-16 DIAGNOSIS — Z87898 Personal history of other specified conditions: Secondary | ICD-10-CM

## 2022-05-16 DIAGNOSIS — M797 Fibromyalgia: Secondary | ICD-10-CM

## 2022-05-16 DIAGNOSIS — C50919 Malignant neoplasm of unspecified site of unspecified female breast: Secondary | ICD-10-CM

## 2022-05-16 DIAGNOSIS — M109 Gout, unspecified: Secondary | ICD-10-CM

## 2022-05-16 DIAGNOSIS — F419 Anxiety disorder, unspecified: Secondary | ICD-10-CM

## 2022-05-16 DIAGNOSIS — H269 Unspecified cataract: Secondary | ICD-10-CM

## 2022-05-16 DIAGNOSIS — T7840XA Allergy, unspecified, initial encounter: Secondary | ICD-10-CM

## 2022-05-16 DIAGNOSIS — M1990 Unspecified osteoarthritis, unspecified site: Secondary | ICD-10-CM

## 2022-05-16 DIAGNOSIS — D539 Nutritional anemia, unspecified: Secondary | ICD-10-CM

## 2022-05-16 DIAGNOSIS — K929 Disease of digestive system, unspecified: Secondary | ICD-10-CM

## 2022-05-16 MED ORDER — SODIUM CHLORIDE 0.9 % IJ SOLN
50 mL | Freq: Once | INTRAVENOUS | 0 refills | Status: AC
Start: 2022-05-16 — End: ?

## 2022-05-16 MED ORDER — GADOBENATE DIMEGLUMINE 529 MG/ML (0.1MMOL/0.2ML) IV SOLN
10 mL | Freq: Once | INTRAVENOUS | 0 refills | Status: CP
Start: 2022-05-16 — End: ?
  Administered 2022-05-16: 16:00:00 10 mL via INTRAVENOUS

## 2022-07-19 ENCOUNTER — Encounter: Admit: 2022-07-19 | Discharge: 2022-07-19 | Payer: MEDICARE

## 2022-07-23 ENCOUNTER — Encounter: Admit: 2022-07-23 | Discharge: 2022-07-23 | Payer: MEDICARE

## 2022-07-23 ENCOUNTER — Ambulatory Visit: Admit: 2022-07-23 | Discharge: 2022-07-24 | Payer: MEDICARE

## 2022-07-23 DIAGNOSIS — H269 Unspecified cataract: Secondary | ICD-10-CM

## 2022-07-23 DIAGNOSIS — C50919 Malignant neoplasm of unspecified site of unspecified female breast: Secondary | ICD-10-CM

## 2022-07-23 DIAGNOSIS — Z9289 Personal history of other medical treatment: Secondary | ICD-10-CM

## 2022-07-23 DIAGNOSIS — K929 Disease of digestive system, unspecified: Secondary | ICD-10-CM

## 2022-07-23 DIAGNOSIS — K219 Gastro-esophageal reflux disease without esophagitis: Secondary | ICD-10-CM

## 2022-07-23 DIAGNOSIS — G4730 Sleep apnea, unspecified: Secondary | ICD-10-CM

## 2022-07-23 DIAGNOSIS — M1990 Unspecified osteoarthritis, unspecified site: Secondary | ICD-10-CM

## 2022-07-23 DIAGNOSIS — H9190 Unspecified hearing loss, unspecified ear: Secondary | ICD-10-CM

## 2022-07-23 DIAGNOSIS — F419 Anxiety disorder, unspecified: Secondary | ICD-10-CM

## 2022-07-23 DIAGNOSIS — Z87898 Personal history of other specified conditions: Secondary | ICD-10-CM

## 2022-07-23 DIAGNOSIS — M797 Fibromyalgia: Secondary | ICD-10-CM

## 2022-07-23 DIAGNOSIS — M549 Dorsalgia, unspecified: Secondary | ICD-10-CM

## 2022-07-23 DIAGNOSIS — H547 Unspecified visual loss: Secondary | ICD-10-CM

## 2022-07-23 DIAGNOSIS — Z9889 Other specified postprocedural states: Secondary | ICD-10-CM

## 2022-07-23 DIAGNOSIS — G629 Polyneuropathy, unspecified: Secondary | ICD-10-CM

## 2022-07-23 DIAGNOSIS — S83282D Other tear of lateral meniscus, current injury, left knee, subsequent encounter: Secondary | ICD-10-CM

## 2022-07-23 DIAGNOSIS — M109 Gout, unspecified: Secondary | ICD-10-CM

## 2022-07-23 DIAGNOSIS — J45909 Unspecified asthma, uncomplicated: Secondary | ICD-10-CM

## 2022-07-23 DIAGNOSIS — D539 Nutritional anemia, unspecified: Secondary | ICD-10-CM

## 2022-07-23 DIAGNOSIS — J189 Pneumonia, unspecified organism: Secondary | ICD-10-CM

## 2022-07-23 DIAGNOSIS — T7840XA Allergy, unspecified, initial encounter: Secondary | ICD-10-CM

## 2022-07-23 DIAGNOSIS — G709 Myoneural disorder, unspecified: Secondary | ICD-10-CM

## 2022-07-23 DIAGNOSIS — M25362 Other instability, left knee: Secondary | ICD-10-CM

## 2022-07-23 NOTE — Patient Instructions
Please do not hesitate to contact my office with any questions.     Scott Mullen, MD FAAOS - Orthopedic Surgeon, Sports Medicine  Stephen Payne, APRN  [facility] - Phone [phone] - Fax [phone]   [address] - [place],  [address]    Dr Mullen's clinic phone line: [phone] - this phone is not physically able to be answered every day, but voicemail is checked regularly and responded to as quickly as possible.    Preferred contact for non-urgent clinical questions: MyChart    For follow up appointments, please call [phone].    Bethany Rogers, MS, LAT, ATC - Clinical Athletic Trainer  Yazmina Pareja, RN, BSN - Clinical Nurse Coordinator    Thank you for supporting our practice! Your feedback helps us deliver the highest quality care.  Review us at: [email or web address]

## 2022-07-30 ENCOUNTER — Encounter: Admit: 2022-07-30 | Discharge: 2022-07-30 | Payer: MEDICARE

## 2022-07-30 MED ORDER — ANASTROZOLE 1 MG PO TAB
1 mg | ORAL_TABLET | Freq: Every day | ORAL | 3 refills | 33.00000 days | Status: AC
Start: 2022-07-30 — End: ?

## 2022-11-15 NOTE — Progress Notes
Name: Hailey Stewart          MRN: [id number]      DOB: 12-14-1950      AGE: 72 y.o.   DATE OF SERVICE: 11/16/2022           Reason for Visit:  Follow Up     Cancer Staging   Malignant neoplasm of upper-outer quadrant of right breast in female, estrogen receptor positive (HCC)  Staging form: Breast, AJCC 8th Edition  - Clinical stage from 07/18/2017: Stage IB (cT2, cN0, cM0, G2, ER+, PR+, HER2-) - Signed by Andrew Au, PA-C on 07/23/2017  - Pathologic stage from 08/15/2017: Stage IA (pT2(2), pN0(sn), cM0, G2, ER+, PR+, HER2-) - Signed by Andrew Au, PA-C on 08/23/2017    Oncology History    Hailey Stewart is a very pleasant 72 y.o.postmenopausal female who presented 07/24/17  for initial consultation regarding her recently diagnosed right breast cancer. She is accompanied by her family. Reported hot flashes, taking Amberen OTC. Voiced she is taking several supplements since having gastric bypass surgery.     Wintress presented to the [facility] 07/18/2017 for evaluation of right breast mass prior to biopsy.  Ms. Escarsega recently had gastric bypass surgery 03/20/17 and after losing 40 lbs she palpated a right breast lump. A suspicious mass was found in the right breast on the screening/tomo bilateral mammogram 07/04/2017 [facility]). A right breast ultrasound 07/05/17 [facility]) confirmed a right breast mass. Right diagnostic mammogram 07/18/17 ([place]) revealed an irregular mass and additional suspicious calcifications in the upper outer right breast. Right ultrasound 07/18/17 ([place]) revealed an irregular 3.3 cm right breast mass at 9:30 with suspicious pleomorphic calcifications with additional suspicious calcifications in the upper outer right breast. She underwent a right sono-guided core needle biopsy 07/18/17 () which revealed IDC, grade II at 9:30. Clinical stage IB; cT2, cN0, cM0. ER 94.7, PR 20.2, Her2 (0 + by IHC) with a Ki67 of 38.77%. Bilateral MRI 07/30/17 demonstrated a right mass and an additional satellite cancer  immediately posterior to the dominant mass. S/p right mastectomy, SLNB with Dr. Loreta Ave and TE with Dr. Fran Lowes on 08/15/17. Pathology revealed multifocal (2 foci) IDC, grade II with DCIS, grade III. Negative lymph nodes (0/3). Pathologic stage IA; pT2(2), pN0(sn), cM0.    Completed AC, Taxol. Taxol stopped early due to neuropathy.     Current Therapy: Arimidex started 03/2018. Prolia which she receives with her local primary care physician locally.    History of Present Illness    Interim history:    Hailey Stewart is a 72 year old female who presents for a follow-up visit. She is doing well. She is tolerating her medication well without any complaints.     History of positional vertigo. She uses meclizine as needed and has to occasionally work with PT. Currently stable.     Follows with PCP. Recently seen for hair thinning and had TSH checked and reports normal. 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 She is not sure if the b12 level was checked with PCP. She will be reaching out to that office to determine when next Prolia injection is due. Will also update BMD.     Mammogram completed today.          Review of Systems   Constitutional:  Negative for activity change, fatigue and fever.   HENT:  Positive for tinnitus (chronic).    Eyes:  Negative for visual disturbance.  Respiratory:  Positive for apnea (doesn't wear the CPAP). Negative for cough and shortness of breath.    Cardiovascular:  Negative for chest pain and leg swelling.   Gastrointestinal:  Negative for abdominal distention, blood in stool, constipation and diarrhea.   Genitourinary:  Negative for difficulty urinating.   Musculoskeletal:  Positive for arthralgias (knee), myalgias (baseline) and neck pain (baseline). Back pain: baseline.  Skin:  Negative for rash.   Neurological:  Positive for numbness. Negative for headaches.   Hematological:  Negative for adenopathy.     Past Medical History:   Diagnosis Date    Acid reflux     Cant remember been a while    Allergy     Sinisitus    Anxiety     On occasion, but not lately.    Arthritis     Asthma     as a child    Back pain     Breast cancer (HCC)     Cataract 2018    Removed now i think in 2017 or 2018 maybe    Fibromyalgia     Gastrointestinal disorder     GERD    GERD (gastroesophageal reflux disease)     Had gall bladder removed    Gout     Hearing reduced     History of blood transfusion 1983    When i had a hysterectomy    History of palpitations     Neuromuscular disorder (HCC)     I have fibermyalgia have had it for a while    Neuropathy     Pneumonia     As a baby    Sleep apnea     wears CPAP machine    Unspecified deficiency anemia     Once in a while. Not surw now.    Vision decreased     Bad eyes since probably 1960     Surgical History:   Procedure Laterality Date    TONSILLECTOMY  1964    BILATERAL SALPINGO-OOPHORECTOMY  1982    HX HYSTERECTOMY  1992    HX APPENDECTOMY  1992    HX CHOLECYSTECTOMY  2014    HX CATARACT REMOVAL Bilateral 2014    HX ACL RECONSTRUCTION Left 2015    FOOT SURGERY Left 2017    BREAST AUGMENTATION Bilateral 2019    GASTRIC BYPASS  03/26/2017    HX BREAST BIOPSY Right 07/18/2017    Malignant [place] Bx    RIGHT SKIN SPARING MASTECTOMY Right 08/15/2017    Performed by Ruthy Dick, DO at [facility] OR    IDENTIFICATION SENTINEL LYMPH NODE Right 08/15/2017    Performed by Ruthy Dick, DO at [facility] OR    INJECTION RADIOACTIVE TRACER FOR SENTINEL NODE IDENTIFICATION Right 08/15/2017    Performed by Ruthy Dick, DO at [facility] OR    SENTINEL LYMPH NODE BIOPSY Right 08/15/2017    Performed by Ruthy Dick, DO at [facility] OR    RECONSTRUCTION BREAST WITH TISSUE EXPANDER AND SUBSEQUENT EXPANSION - IMMEDIATE/ DELAYED Right 08/15/2017    Performed by Marlinda Mike, MD at [facility] OR    IMPLANTATION BIOLOGIC IMPLANT FOR SOFT TISSUE REINFORCEMENT Right 08/15/2017    Performed by Marlinda Mike, MD at [facility] OR    PORT PLACEMENT Left 09/20/2017    Performed by Geralyn Flash, MD at [facility] OR    RIGHT TISSUE EXPANDER EXCHANGE Right 10/10/2017    Performed by Marlinda Mike, MD at [facility] OR  REPLACEMENT TISSUE EXPANDER WITH PERMANENT PROSTHESIS Right 05/27/2018    Performed by Marlinda Mike, MD at [facility] OR    INSERTION BREAST PROSTHESIS FOLLOWING MASTOPEXY/ MASTECTOMY/ IN RECONSTRUCTION - IMMEDIATE Left 05/27/2018    Performed by Marlinda Mike, MD at [facility] OR    REVISION RECONSTRUCTED BREAST Right 05/27/2018    Performed by Marlinda Mike, MD at [facility] OR    INSERTION BREAST PROSTHESIS FOLLOWING MASTOPEXY/ MASTECTOMY/ IN RECONSTRUCTION - IMMEDIATE Bilateral 09/24/2018    Performed by Marlinda Mike, MD at [facility] OR    FAT GRAFTING TO RIGHT BREAST S - 50 CC OR LESS - reduced service.  3.53ml harvested Right 09/24/2018    Performed by Marlinda Mike, MD at [facility] OR    REVISION RECONSTRUCTED BREAST Left 09/24/2018    Performed by Marlinda Mike, MD at [facility] OR    REMOVAL TUNNELED CENTRAL VENOUS ACCESS DEVICE INCLUDING PORT/ PUMP Left 09/24/2018    Performed by Marlinda Mike, MD at [facility] OR    INSERTION BREAST PROSTHESIS FOLLOWING MASTOPEXY/ MASTECTOMY/ IN RECONSTRUCTION - DELAYED Right 12/23/2018    Performed by Marlinda Mike, MD at [facility] OR    REVISION RECONSTRUCTED BREAST Left 12/23/2018    Performed by Marlinda Mike, MD at [facility] OR    REVISION RECONSTRUCTED BREAST - reduced service Bilateral 04/15/2019    Performed by Marlinda Mike, MD at [facility] OR    ARTHROSCOPIC REPAIR/ AUGMENTATION/ RECONSTRUCTION ANTERIOR CRUCIATE LIGAMENT Left 11/02/2021    Performed by Talmadge Chad, MD at [facility] OR    ARTHROSCOPY KNEE WITH LATERAL MENISCUS REPAIR Left 11/02/2021    Performed by Talmadge Chad, MD at [facility] OR    BREAST SURGERY  2019    Cancer, I think since 2019    CHEMO CART      Actually after surgery    COLONOSCOPY      Several times last one was through Dr Guss Bunde from [place].    COSMETIC SURGERY      Due to breast cancer. Also eye lid repair.    EYE SURGERY      Cateracts removed & eye lid repair    FRACTURE SURGERY      Broke wrist not sure when probable early 2000's    OVARY SURGERY  1983    Had a hysterectomy I think correct year.    TUNNELED VENOUS PORT PLACEMENT  2019    During chemo, but removed now.    WRIST SURGERY Left 2000's     Family History   Problem Relation Name Age of Onset    Diabetes Mother Alonna Minium Architectural technologist     Arthritis-rheumatoid Mother Alonna Minium Schwede Blacketer     Depression Mother Alonna Minium Schwede Blacketer     Arthritis-rheumatoid Father Jeanann Lewandowsky Blacketer     Diabetes Maternal Grandmother Rennis Golden Schwede     Heart Disease Maternal Grandmother Rennis Golden Schwede      Social History     Socioeconomic History    Marital status: Married   Tobacco Use    Smoking status: Never    Smokeless tobacco: Never   Vaping Use    Vaping status: Never Used   Substance and Sexual Activity    Alcohol use: Yes     Alcohol/week: 1.0 standard drink of alcohol     Types: 1 Glasses of wine per week     Comment: Rarely use    Drug use: Never    Sexual  activity: Not Currently     Partners: Male     Birth control/protection: None     Objective:          acetaminophen (ACETAMINOPHEN EXTRA STRENGTH) 500 mg tablet Take two tablets by mouth every 8 hours. Max of 4,000 mg of acetaminophen in 24 hours.    allopurinol (ZYLOPRIM) 100 mg tablet Take one tablet by mouth daily. Take with food.    anastrozole (ARIMIDEX) 1 mg tablet Take one tablet by mouth daily.    ascorbic acid (vitamin C) (VITAMIN-C) 250 mg tablet Take one tablet by mouth daily.    biotin 10,000 mcg TbDi Dissolve  by mouth daily.    docusate (COLACE) 100 mg capsule Take one capsule by mouth twice daily.    famotidine-Ca Carb-Mag Hydrox (PEPCID COMPLETE) 10-800-165 mg tablet Chew one tablet by mouth twice daily.    gabapentin (NEURONTIN) 300 mg capsule Take one capsule by mouth twice daily.    other medication Take one Dose by mouth daily. Bariatric Pal Multivitamin 1 daily  Bariatric Multivitamin + Minerals to include daily (minimum): Folic Acid (Folate) 400 mcg, Thiamine 12 mg, Vitamin A 5000 units, Vitamin E 15 IU, Vitamin K 90 mcg Copper 2 mg, and Zinc 11mg     pantoprazole DR (PROTONIX) 40 mg tablet Take one tablet by mouth twice daily.    senna (SENOKOT) 8.6 mg tablet Take one tablet by mouth twice daily. Please take while on narcotic pain medication. Please hold for loose stools.    simvastatin (ZOCOR) 20 mg tablet Take one tablet by mouth at bedtime daily.    traMADoL (ULTRAM) 50 mg tablet Take one tablet by mouth every 6 hours as needed for Pain.    turmeric root extract 538 mg cap Take  by mouth daily.    vitamins, B complex tab Take one tablet by mouth daily.     Vitals:    11/16/22 1309   BP: 119/68   BP Source: Arm, Right Upper   Pulse: 64   Temp: (!) 35.9 ?C (96.7 ?F)   Resp: 16   SpO2: 100%   TempSrc: Temporal   PainSc: Zero   Weight: 53.7 kg (118 lb 6.4 oz)       Body mass index is 21.66 kg/m?Marland Kitchen     Pain Score: Zero       Fatigue Scale: 0-None    Pain Addressed:  N/A    Patient Evaluated for a Clinical Trial: No treatment clinical trial available for this patient.     [place] Cooperative Oncology Group performance status is 1, Restricted in physically strenuous activity but ambulatory and able to carry out work of a light or sedentary nature, e.g., light house work, office work.     Physical Exam  Vitals reviewed.   Constitutional:       Appearance: Normal appearance. She is well-developed.   HENT:      Head: Normocephalic and atraumatic.      Mouth/Throat:      Mouth: Mucous membranes are moist. No oral lesions.      Pharynx: No oropharyngeal exudate.   Eyes:      Conjunctiva/sclera: Conjunctivae normal.   Cardiovascular:      Rate and Rhythm: Normal rate.   Pulmonary:      Effort: Pulmonary effort is normal. No respiratory distress.   Chest:          Comments: Declined breast exam; completed mammogram today.   Abdominal:      General: There is  no distension.      Tenderness: There is no abdominal tenderness. There is no guarding.   Musculoskeletal:      Cervical back: Neck supple.      Right lower leg: No edema.      Left lower leg: No edema.   Skin:     General: Skin is warm and dry.   Neurological:      Mental Status: She is alert and oriented to person, place, and time.   Psychiatric:         Mood and Affect: Mood normal.         Behavior: Behavior normal.               Assessment and Plan:    The patient is a 72 y.o.  female with the following:    1.  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   2.  S/p right mastectomy, SLNB with Dr. Loreta Ave and TE with Dr. Fran Lowes on 08/15/17. Pathology revealed multifocal (2 foci) IDC, grade II with DCIS, grade III. Negative lymph nodes (0/3). Pathologic stage IA; pT2(2), pN0(sn), cM0. Oncotype Dx testing which showed a RS of 27.   3.  History of gastric bypass, pt unable to take steroids without a protective regimen of PPI plus carafate.   4.  Osteoporosis.  5.  extremely dense breast tissue     Continue Arimidex daily. Started 03/2018.    Left mammogram 11/15/21: BIRAD 1    Repeat completed today: benign.  Repeat annually.     Last MRI 05/16/22: negative.    Due to extremely dense breast tissue, recommend two breast imaging modalities annually.     Last BMD 05/03/21 with osteoporosis (T score -2.5)  --repeat BMD, order provided.     Discussed option for BCI testing to determine additional benefit in pursuing endocrine therapy beyond five years. She does have osteoporosis and has continued on Prolia. Noted that risk /benefit will need to be weighed in consideration of ongoing endocrine therapy.     Continue Prolia with PCP.     Follow-up in 6 months.       Dellia Cloud Collaborating Provider  Bishop Limbo, APRN-NP      Total Time Today was 30 minutes in the following activities: Preparing to see the patient, Obtaining and/or reviewing separately obtained history, Performing a medically appropriate examination and/or evaluation, Counseling and educating the patient/family/caregiver, Ordering medications, tests, or procedures, Documenting clinical information in the electronic or other health record and Independently interpreting results (not separately reported) and communicating results to the patient/family/caregiver

## 2022-11-16 ENCOUNTER — Encounter: Admit: 2022-11-16 | Discharge: 2022-11-16 | Payer: MEDICARE

## 2022-11-16 DIAGNOSIS — C50411 Malignant neoplasm of upper-outer quadrant of right female breast: Secondary | ICD-10-CM

## 2022-11-20 ENCOUNTER — Encounter: Admit: 2022-11-20 | Discharge: 2022-11-20 | Payer: MEDICARE

## 2022-11-20 DIAGNOSIS — C50411 Malignant neoplasm of upper-outer quadrant of right female breast: Secondary | ICD-10-CM

## 2022-11-20 NOTE — Progress Notes
Breast Cancer index order form filled out per Dr. Biagio Borg office provider request. Order submitted on specimen number [id number] on procedure date 08/15/2017.

## 2022-11-23 ENCOUNTER — Ambulatory Visit: Admit: 2022-11-23 | Discharge: 2022-11-23 | Payer: MEDICARE

## 2022-11-28 ENCOUNTER — Ambulatory Visit: Admit: 2022-11-28 | Discharge: 2022-11-28 | Payer: MEDICARE

## 2022-12-05 ENCOUNTER — Encounter: Admit: 2022-12-05 | Discharge: 2022-12-05 | Payer: MEDICARE

## 2023-04-26 ENCOUNTER — Encounter: Admit: 2023-04-26 | Discharge: 2023-04-26 | Payer: MEDICARE

## 2023-05-15 ENCOUNTER — Encounter: Admit: 2023-05-15 | Discharge: 2023-05-15 | Payer: MEDICARE

## 2023-05-20 ENCOUNTER — Encounter: Admit: 2023-05-20 | Discharge: 2023-05-20 | Payer: MEDICARE

## 2023-05-22 ENCOUNTER — Encounter: Admit: 2023-05-22 | Discharge: 2023-05-22 | Payer: MEDICARE

## 2023-06-03 ENCOUNTER — Encounter: Admit: 2023-06-03 | Discharge: 2023-06-03 | Payer: MEDICARE

## 2023-07-22 ENCOUNTER — Encounter: Admit: 2023-07-22 | Discharge: 2023-07-22 | Payer: MEDICARE

## 2023-07-22 MED ORDER — ANASTROZOLE 1 MG PO TAB
1 mg | ORAL_TABLET | Freq: Every day | ORAL | 3 refills | 33.00000 days | Status: AC
Start: 2023-07-22 — End: ?

## 2023-11-26 ENCOUNTER — Encounter: Admit: 2023-11-26 | Discharge: 2023-11-26 | Payer: MEDICARE

## 2023-11-26 NOTE — Patient Instructions [37]
 Thank you for coming to see [place] today.   Please call our office or send a message through MyChart if you have any questions or concerns.          Dr. Dellia Cloud and Eyvonne Mechanic, APRN  Delorise Royals, APRN  Nurses: Kris Mouton, Karin Lieu and [place] Crimi  Phone: [phone]   Fax: [phone]    [facility] Location  [address] [place], [place] [address]    [facility] Location  [address] [place]  [place], [place] [address]

## 2023-11-29 ENCOUNTER — Ambulatory Visit: Admit: 2023-11-29 | Discharge: 2023-11-29 | Payer: MEDICARE

## 2023-11-29 ENCOUNTER — Encounter: Admit: 2023-11-29 | Discharge: 2023-11-29 | Payer: MEDICARE

## 2023-11-29 VITALS — BP 104/63 | HR 69 | Temp 98.00000°F | Resp 16 | Wt 122.8 lb

## 2023-11-29 DIAGNOSIS — M810 Age-related osteoporosis without current pathological fracture: Secondary | ICD-10-CM

## 2023-11-29 DIAGNOSIS — Z9189 Other specified personal risk factors, not elsewhere classified: Secondary | ICD-10-CM

## 2023-11-29 DIAGNOSIS — Z79811 Long term (current) use of aromatase inhibitors: Secondary | ICD-10-CM

## 2023-11-29 DIAGNOSIS — C50411 Malignant neoplasm of upper-outer quadrant of right female breast: Principal | ICD-10-CM

## 2023-11-29 DIAGNOSIS — R923 Dense breast tissue: Secondary | ICD-10-CM

## 2023-11-29 NOTE — Progress Notes [1]
 Name: Hailey Stewart          MRN: [id number]      DOB: [birth year]      AGE: 73 y.o.   DATE OF SERVICE: 11/29/2023           Reason for Visit:  Follow Up     Cancer Staging   Malignant neoplasm of upper-outer quadrant of right breast in female, estrogen receptor positive (CMS-HCC)  Staging form: Breast, AJCC 8th Edition  - Clinical stage from 07/18/2017: Stage IB (cT2, cN0, cM0, G2, ER+, PR+, HER2-) - Signed by Gean Countryman, PA-C on 07/23/2017  - Pathologic stage from 08/15/2017: Stage IA (pT2(2), pN0(sn), cM0, G2, ER+, PR+, HER2-) - Signed by Gean Countryman, PA-C on 08/23/2017    Oncology History    Hailey Stewart is a very pleasant 73 y.o.postmenopausal female who presented 07/24/17  for initial consultation regarding her recently diagnosed right breast cancer. She is accompanied by her family. Reported hot flashes, taking Amberen OTC. Voiced she is taking several supplements since having gastric bypass surgery.     Hailey Stewart presented to the [facility] 07/18/2017 for evaluation of right breast mass prior to biopsy.  Hailey Stewart recently had gastric bypass surgery 03/20/17 and after losing 40 lbs she palpated a right breast lump. A suspicious mass was found in the right breast on the screening/tomo bilateral mammogram 07/04/2017 [facility]). A right breast ultrasound 07/05/17 [facility]) confirmed a right breast mass. Right diagnostic mammogram 07/18/17 ([place]) revealed an irregular mass and additional suspicious calcifications in the upper outer right breast. Right ultrasound 07/18/17 ([place]) revealed an irregular 3.3 cm right breast mass at 9:30 with suspicious pleomorphic calcifications with additional suspicious calcifications in the upper outer right breast. She underwent a right sono-guided core needle biopsy 07/18/17 ([place]) which revealed IDC, grade II at 9:30. Clinical stage IB; cT2, cN0, cM0. ER 94.7, PR 20.2, Her2 (0 + by IHC) with a Ki67 of 38.77%. Bilateral MRI 07/30/17 demonstrated a right mass and an additional satellite cancer  immediately posterior to the dominant mass. S/p right mastectomy, SLNB with Dr. Alona and TE with Dr. Awanda on 08/15/17. Pathology revealed multifocal (2 foci) IDC, grade II with DCIS, grade III. Negative lymph nodes (0/3). Pathologic stage IA; pT2(2), pN0(sn), cM0.    Completed AC, Taxol . Taxol  stopped early due to neuropathy.     Current Therapy: Arimidex  started 03/2018. Prolia  which she receives with her local primary care physician locally.    Interim history:       Hailey Stewart is a 73 year old female who presents for routine follow-up      She is undergoing routine surveillance for breast cancer due to dense breast tissue, necessitating regular mammograms and additional imaging. Her last breast MRI was in May 2025, with the next scheduled in six months. Recent mammogram results were normal. No new concerns or changes in her breast health since her last mammogram.    She is currently on Prolia  for bone density management, which she receives closer to home. She had a bone density test conducted in May 2025 and another is planned in six months.    She takes anastrozole  once daily and experiences joint stiffness and pain, particularly in her hands and knees, described as persistent discomfort. She has previously used Celebrex  for inflammation.    She takes several supplements, including turmeric, prenatal vitamins, vitamin D3, B vitamins, a multivitamin, CoQ10, and magnesium. She reported taking turmeric  supplements and was informed that there may be potential interactions with anastrozole . She also takes omega supplements for inflammation.       Review of Systems   Respiratory:  Negative for shortness of breath.    Cardiovascular:  Negative for chest pain.   Neurological:  Negative for weakness.   Psychiatric/Behavioral:  Negative for confusion.      Past Medical History:    Acid reflux    Allergy    Anxiety Arthritis    Asthma    Back pain    Breast cancer (CMS-HCC)    Cataract    Fibromyalgia    Gastrointestinal disorder    GERD (gastroesophageal reflux disease)    Gout    Hearing reduced    History of blood transfusion    History of palpitations    Neuromuscular disorder (CMS-HCC)    Neuropathy    Pneumonia    Sleep apnea    Unspecified deficiency anemia    Vision decreased     Surgical History:   Procedure Laterality Date    TONSILLECTOMY  1964    BILATERAL SALPINGO-OOPHORECTOMY  1982    HX HYSTERECTOMY  1992    HX APPENDECTOMY  1992    HX CHOLECYSTECTOMY  2014    HX CATARACT REMOVAL Bilateral 2014    HX ACL RECONSTRUCTION Left 2015    FOOT SURGERY Left 2017    BREAST AUGMENTATION Bilateral 2019    GASTRIC BYPASS  03/26/2017    HX BREAST BIOPSY Right 07/18/2017    Malignant US  Bx    RIGHT SKIN SPARING MASTECTOMY Right 08/15/2017    Performed by Alona Pierce, DO at [facility]    IDENTIFICATION SENTINEL LYMPH NODE Right 08/15/2017    Performed by Alona Pierce, DO at [facility]    INJECTION RADIOACTIVE TRACER FOR SENTINEL NODE IDENTIFICATION Right 08/15/2017    Performed by Alona Pierce, DO at [facility]    SENTINEL LYMPH NODE BIOPSY Right 08/15/2017    Performed by Alona Pierce, DO at [facility]    RECONSTRUCTION BREAST WITH TISSUE EXPANDER AND SUBSEQUENT EXPANSION - IMMEDIATE/ DELAYED Right 08/15/2017    Performed by Awanda Camellia BROCKS, MD at [facility]    IMPLANTATION BIOLOGIC IMPLANT FOR SOFT TISSUE REINFORCEMENT Right 08/15/2017    Performed by Awanda Camellia BROCKS, MD at [facility]    PORT PLACEMENT Left 09/20/2017    Performed by Claudean Elsie CROME, MD at [facility]    RIGHT TISSUE EXPANDER EXCHANGE Right 10/10/2017    Performed by Awanda Camellia BROCKS, MD at [facility]    REPLACEMENT TISSUE EXPANDER WITH PERMANENT PROSTHESIS Right 05/27/2018    Performed by Awanda Camellia BROCKS, MD at [facility] OR    INSERTION BREAST PROSTHESIS FOLLOWING MASTOPEXY/ MASTECTOMY/ IN RECONSTRUCTION - IMMEDIATE Left 05/27/2018    Performed by Awanda Camellia BROCKS, MD at [facility] OR    REVISION RECONSTRUCTED BREAST Right 05/27/2018    Performed by Awanda Camellia BROCKS, MD at [facility] OR    INSERTION BREAST PROSTHESIS FOLLOWING MASTOPEXY/ MASTECTOMY/ IN RECONSTRUCTION - IMMEDIATE Bilateral 09/24/2018    Performed by Awanda Camellia BROCKS, MD at [facility] OR    FAT GRAFTING TO RIGHT BREAST S - 50 CC OR LESS - reduced service.  3.103ml harvested Right 09/24/2018    Performed by Awanda Camellia BROCKS, MD at [facility] OR    REVISION RECONSTRUCTED BREAST Left 09/24/2018    Performed by Awanda Camellia BROCKS, MD at [facility] OR    REMOVAL TUNNELED CENTRAL VENOUS ACCESS  DEVICE INCLUDING PORT/ PUMP Left 09/24/2018    Performed by Awanda Camellia BROCKS, MD at [facility] OR    INSERTION BREAST PROSTHESIS FOLLOWING MASTOPEXY/ MASTECTOMY/ IN RECONSTRUCTION - DELAYED Right 12/23/2018    Performed by Awanda Camellia BROCKS, MD at [facility] OR    REVISION RECONSTRUCTED BREAST Left 12/23/2018    Performed by Awanda Camellia BROCKS, MD at [facility] OR    REVISION RECONSTRUCTED BREAST - reduced service Bilateral 04/15/2019    Performed by Awanda Camellia BROCKS, MD at [facility] OR    ARTHROSCOPIC REPAIR/ AUGMENTATION/ RECONSTRUCTION ANTERIOR CRUCIATE LIGAMENT Left 11/02/2021    Performed by Leopold Glendia HERO, MD at [facility]    ARTHROSCOPY KNEE WITH LATERAL MENISCUS REPAIR Left 11/02/2021    Performed by Leopold Glendia HERO, MD at [facility]    BREAST SURGERY  2019    Cancer, I think since 2019    CHEMO CART      Actually after surgery    COLONOSCOPY      Several times last one was through Dr Bethany from [facility].    COSMETIC SURGERY      Due to breast cancer. Also eye lid repair.    EYE SURGERY      Cateracts removed & eye lid repair    FRACTURE SURGERY      Broke wrist not sure when probable early 2000's    OVARY SURGERY  1983    Had a hysterectomy I think correct year.    TUNNELED VENOUS PORT PLACEMENT  2019    During chemo, but removed now.    WRIST SURGERY Left 2000's     Family History   Problem Relation Name Age of Onset    Diabetes Mother Naomie Botts Architectural Technologist     Arthritis-rheumatoid Mother Naomie Botts Schwede Blacketer     Depression Mother Naomie Botts Schwede Blacketer Arthritis-rheumatoid Father Elsie Beath Blacketer     Diabetes Maternal Grandmother Maurilio Fass Schwede     Heart Disease Maternal Grandmother Maurilio Fass Schwede     Cancer-Colon Sister Deb dunbar         58     Social History     Socioeconomic History    Marital status: Married   Tobacco Use    Smoking status: Never    Smokeless tobacco: Never   Vaping Use    Vaping status: Never Used   Substance and Sexual Activity    Alcohol use: Yes     Alcohol/week: 1.0 standard drink of alcohol     Types: 1 Glasses of wine per week     Comment: Rarely use have not drank in a year.    Drug use: Never    Sexual activity: Not Currently     Partners: Male     Birth control/protection: None     Objective:          acetaminophen  (ACETAMINOPHEN  EXTRA STRENGTH) 500 mg tablet Take two tablets by mouth every 8 hours. Max of 4,000 mg of acetaminophen  in 24 hours.    allopurinol  (ZYLOPRIM ) 100 mg tablet Take one tablet by mouth daily. Take with food.    anastrozole  (ARIMIDEX ) 1 mg tablet TAKE 1 TABLET DAILY    ascorbic acid (vitamin C) (VITAMIN-C) 250 mg tablet Take one tablet by mouth daily.    biotin 10,000 mcg TbDi Dissolve  by mouth daily.    docusate (COLACE) 100 mg capsule Take one capsule by mouth twice daily.    famotidine -Ca Carb-Mag Hydrox (PEPCID  COMPLETE) 10-800-165 mg tablet  Chew one tablet by mouth twice daily.    gabapentin  (NEURONTIN ) 300 mg capsule Take one capsule by mouth twice daily.    other medication Take one Dose by mouth daily. Bariatric Pal Multivitamin 1 daily  Bariatric Multivitamin + Minerals to include daily (minimum): Folic Acid (Folate) 400 mcg, Thiamine 12 mg, Vitamin A 5000 units, Vitamin E 15 IU, Vitamin K 90 mcg Copper 2 mg, and Zinc 11mg     pantoprazole  DR (PROTONIX ) 40 mg tablet Take one tablet by mouth twice daily.    senna (SENOKOT) 8.6 mg tablet Take one tablet by mouth twice daily. Please take while on narcotic pain medication. Please hold for loose stools.    simvastatin  (ZOCOR ) 20 mg tablet Take one tablet by mouth at bedtime daily.    traMADoL  (ULTRAM ) 50 mg tablet Take one tablet by mouth every 6 hours as needed for Pain.    turmeric root extract 538 mg cap Take  by mouth daily.    vitamins, B complex tab Take one tablet by mouth daily.     Vitals:    11/29/23 1250   BP: 104/63   BP Source: (P) Arm, Left Upper   Pulse: (P) 69   Temp: (P) 36.7 ?C (98 ?F)   Resp: (P) 16   SpO2: (P) 100%   TempSrc: (P) Oral   PainSc: Zero   Weight: 55.7 kg (122 lb 12.8 oz)         Body mass index is 22.46 kg/m?SABRA         Pain Addressed:  N/A    Patient Evaluated for a Clinical Trial: No treatment clinical trial available for this patient.     Eastern Cooperative Oncology Group performance status is 1, Restricted in physically strenuous activity but ambulatory and able to carry out work of a light or sedentary nature, e.g., light house work, office work.     Physical Exam  Vitals reviewed.   Constitutional:       General: She is not in acute distress.     Appearance: She is well-developed.   HENT:      Mouth/Throat:      Mouth: No oral lesions.   Eyes:      General: No scleral icterus.  Cardiovascular:      Rate and Rhythm: Normal rate.   Pulmonary:      Effort: Pulmonary effort is normal. No respiratory distress.   Chest:       Skin:     General: Skin is warm.      Coloration: Skin is not jaundiced.   Neurological:      Mental Status: She is alert and oriented to person, place, and time.   Psychiatric:         Mood and Affect: Mood normal.         Behavior: Behavior normal.           Assessment and Plan:    The patient is a 73 y.o.  female with the following:    1.  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   2.  S/p right mastectomy, SLNB with Dr. Alona and TE with Dr. Awanda on 08/15/17. Pathology revealed multifocal (2 foci) IDC, grade II with DCIS, grade III. Negative lymph nodes (0/3). Pathologic stage IA; pT2(2), pN0(sn), cM0. Oncotype Dx testing which showed a RS of 27.   3.  History of gastric bypass, pt unable to take steroids without a protective regimen of PPI plus carafate .   4.  Osteoporosis.  5.  extremely dense breast tissue       Malignant neoplasm of right breast, status post treatment with ongoing surveillance  Mammogram results normal. No new symptoms or concerns.  - Ordered MRI of the breast in six months for ongoing surveillance.  - Repeat mammogram one year  - Continue Arimidex  daily    Dense breast tissue with ongoing imaging surveillance  Dense breast tissue requires additional imaging beyond mammogram.  - Ordered MRI of the breast in six months for ongoing surveillance.  - Due to dense breast tissue, recommend two breast imaging modalities annually.     Age-related osteoporosis, stable on Prolia  therapy  Bone density stable on Prolia  therapy.  - Ordered repeat bone density scan in six months.            Delon GORMAN Pepper, APRN-NP    Total Time Today was 30 minutes in the following activities: Preparing to see the patient, Obtaining and/or reviewing separately obtained history, Performing a medically appropriate examination and/or evaluation, Counseling and educating the patient/family/caregiver, Ordering medications, tests, or procedures, Documenting clinical information in the electronic or other health record and Independently interpreting results (not separately reported) and communicating results to the patient/family/caregiver
